# Patient Record
Sex: Male | Born: 1955 | Race: White | Hispanic: No | Marital: Married
Health system: Midwestern US, Academic
[De-identification: ages and names within clinical notes are randomized; demographics above are authoritative.]

---

## 2016-10-22 ENCOUNTER — Encounter: Admit: 2016-10-22 | Discharge: 2016-10-22 | Payer: Commercial Managed Care - PPO

## 2016-10-22 NOTE — Telephone Encounter
Received new referral, via fax. Docs scanned in 10/22/16.

## 2016-10-23 NOTE — Telephone Encounter
Service: LTC  Referring: Berline Chough, MD  Urgency: Urgent  Provider: First available  Dx: Multiple liver lesions, Thyroid cancer  OV Note: In outside records  Imaging/Location: PET scan report in outside records, Bone scan imaging from January 2018 in [facility]  Pathology/Location: None  Labs: In outside records, some from 2017 in [facility]

## 2016-10-24 ENCOUNTER — Encounter: Admit: 2016-10-24 | Discharge: 2016-10-24 | Payer: Commercial Managed Care - PPO

## 2016-10-24 NOTE — Telephone Encounter
Called patient to schedule new appointment with Dr. Suzette Battiest on Monday, October 29, 2016 at 1:00pm.   Verified demos and mailed appointment letter/map.

## 2016-10-29 ENCOUNTER — Encounter: Admit: 2016-10-29 | Discharge: 2016-10-29 | Payer: Commercial Managed Care - PPO

## 2016-10-29 ENCOUNTER — Ambulatory Visit: Admit: 2016-10-29 | Discharge: 2016-10-29 | Payer: Commercial Managed Care - PPO

## 2016-10-29 DIAGNOSIS — IMO0002 Squamous cell carcinoma: ICD-10-CM

## 2016-10-29 DIAGNOSIS — N200 Calculus of kidney: ICD-10-CM

## 2016-10-29 DIAGNOSIS — M549 Dorsalgia, unspecified: ICD-10-CM

## 2016-10-29 DIAGNOSIS — E785 Hyperlipidemia, unspecified: Secondary | ICD-10-CM

## 2016-10-29 DIAGNOSIS — M545 Low back pain: ICD-10-CM

## 2016-10-29 DIAGNOSIS — K769 Liver disease, unspecified: Principal | ICD-10-CM

## 2016-10-29 DIAGNOSIS — N400 Enlarged prostate without lower urinary tract symptoms: ICD-10-CM

## 2016-10-29 DIAGNOSIS — C73 Malignant neoplasm of thyroid gland: ICD-10-CM

## 2016-10-29 DIAGNOSIS — E039 Hypothyroidism, unspecified: ICD-10-CM

## 2016-10-29 DIAGNOSIS — Z85850 Personal history of malignant neoplasm of thyroid: ICD-10-CM

## 2016-10-29 DIAGNOSIS — E119 Type 2 diabetes mellitus without complications: Principal | ICD-10-CM

## 2016-10-29 DIAGNOSIS — K2270 Barrett's esophagus without dysplasia: ICD-10-CM

## 2016-10-29 DIAGNOSIS — B999 Unspecified infectious disease: ICD-10-CM

## 2016-10-29 DIAGNOSIS — I10 Essential (primary) hypertension: ICD-10-CM

## 2016-10-29 DIAGNOSIS — F419 Anxiety disorder, unspecified: ICD-10-CM

## 2016-10-29 DIAGNOSIS — K219 Gastro-esophageal reflux disease without esophagitis: ICD-10-CM

## 2016-10-29 DIAGNOSIS — M1990 Unspecified osteoarthritis, unspecified site: ICD-10-CM

## 2016-10-29 LAB — LIVER FUNCTION PANEL
Lab: 0.5 mg/dL (ref 0.3–1.2)
Lab: 22 U/L (ref 7–40)
Lab: 40 U/L (ref 7–56)
Lab: 8 g/dL (ref 6.0–8.0)
Lab: 82 U/L (ref 25–110)

## 2016-10-29 LAB — HEPATITIS C ANTIBODY W REFLEX HCV PCR QUANT: Lab: NEGATIVE g/dL (ref 3.5–5.0)

## 2016-10-30 NOTE — Progress Notes
Date of Service: 10/29/2016     Subjective:             Jimmy Waters is a 61 y.o. male presenting for evaluation of liver masses.    History of Present Illness   Jimmy Waters is a 61 y.o. 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  ???  Number and location of liver mass(es)- multiple  ???  Imaging to date-CT a/p without contrast 10/25/16; PET CT 10/05/16  ???  HCC risk- low, given stability (compared to CT 2015)  ???  Underlying liver disease- RF for chronic liver disease, including metabolic syndrome and prior significant EtOH use; however, no features to suggest underlying advanced fibrosis/cirrhosis    Patient is accompanied by his wife. Internal and outside records are reviewed. They are excellent historians.    Patient reports first hearing of concern for liver masses recently after PET scan. PET was ordered following + RAI scan. He had been told that he had hepatic cysts in the past, but now with concern for metastatic thyroid cancer, or other non benign mass. CT a/p with contrast back in 2015 (performed for abdominal pain) demonstrated multiple, low density hepatic lesions, consistent with cysts; hepatic steatosis; normal size liver and spleen. PET demonstrated six non metabolically active, low density liver lesions, largest measuring 1.9cm. He recently has suffered from nephrolithiasis and had non contrasted imaging performed as well, including CT without contrast 10/25/16, with notable hepatic and renal cysts. He has never been told that he has elevated liver tests. He has not had any signs or symptoms referable to liver disease. Specifically, he denies BRBPR, melena, jaundice, pruritus, fluid retention, day-night reversal and episodes of confusion. RF for HCV include a remote history intranasal methamphetamine use. No other RF for HCV, including blood transfusion before 1990, unprofessionally placed tattoos, IVDA, intranasal cocaine use, nor high risk sexual behavior. He has never undergone liver biopsy. He has never undergone endoscopy. Last surveillance colon last week without polyps; polyps noted on prior.    He reports significant alcohol use when he was working, typically finishing a 12 pack plus of beer with a few guys after work each night. He is now retired and drinks 1-2 drinks per week.    Family history is significant for alcoholism in his father and RA in sister.    Past Medical History:  Past Medical History:   Diagnosis Date   ??? Anxiety disorder    ??? Back pain    ??? Barrett esophagus    ??? BPH (benign prostatic hyperplasia)    ??? Cataract    ??? Chronic lower back pain    ??? Depression (disease)    ??? DM type 2 (diabetes mellitus, type 2) (HCC)    ??? Dyslipidemia    ??? GERD (gastroesophageal reflux disease)    ??? HLD (hyperlipidemia)    ??? HTN (hypertension)    ??? Hypothyroidism    ??? Infection    ??? Kidney stones    ??? Osteoarthritis    ??? Squamous cell carcinoma    ??? Thyroid cancer (HCC) 2009       Surgical History:  Past Surgical History:   Procedure Laterality Date   ??? BACK  SURGERY  1982   ??? CYSTOURETHROSCOPY  2012   ??? KIDNEY STONE SURGERY  2012   ??? COLONOSCOPY  02/14/11   ??? HX APPENDECTOMY  12/12   ??? HX JOINT REPLACEMENT     ??? HX VASECTOMY     ??? THYROIDECTOMY         Social History:  Social History     Social History   ??? Marital status: Married     Spouse name: N/A   ??? Number of children: N/A   ??? Years of education: N/A     Occupational History   ??? Not on file.     Social History Main Topics   ??? Smoking status: Never Smoker   ??? Smokeless tobacco: Never Used   ??? Alcohol use 3.0 oz/week     3 Cans of beer, 2 Shots of liquor per week   ??? Drug use: No ??? Sexual activity: Yes     Other Topics Concern   ??? Not on file     Social History Narrative   ??? No narrative on file       Family History:  Family History   Problem Relation Age of Onset   ??? Cancer-Lung Mother    ??? Melanoma Mother    ??? Cancer Mother    ??? Diabetes Mother    ??? Heart Disease Mother    ??? Diabetes Father    ??? Hypertension Sister    ??? Heart Disease Sister    ??? Cancer Sister    ??? Diabetes Sister    ??? Arthritis Sister    ??? Hypertension Brother    ??? Diabetes Brother    ??? Arthritis Brother             Review of Systems  A complete 10 point review of systems was asked and answered in the negative unless specifically commented upon in the HPI    Objective:         ??? acetaminophen (TYLENOL) 500 mg tablet Take 500 mg by mouth every 6 hours as needed for Pain. Max of 4,000 mg of acetaminophen in 24 hours.   ??? aspirin 81 mg chewable tablet Take 81 mg by mouth daily.     ??? atorvastatin (LIPITOR) 10 mg tablet Take 1 tablet by mouth daily. Take as directed   ??? blood sugar diagnostic test strip Use 1 strip as directed before meals and at bedtime.   ??? cholecalciferol (Vitamin D3) (VITAMIN D-3) 1,000 units tablet Take 1,000 Units by mouth twice daily.     ??? ciclopirox (LOPROX) 0.77 % topical cream Apply  topically to affected area twice daily.   ??? docusate (COLACE) 50 mg/5 mL oral solution Take 50 mg by mouth daily.   ??? dutasteride (AVODART) 0.5 mg capsule Take 1 Cap by mouth daily.   ??? Fluocinolone 0.01 % oil Apply  topically to affected area.   ??? gabapentin (NEURONTIN) 600 mg tablet Take 600 mg by mouth every 8 hours.   ??? glimepiride (AMARYL) 4 mg tablet Take 1 Tab by mouth twice daily.   ??? levothyroxine (SYNTHROID) 200 mcg tablet Take 1 Tab by mouth daily.   ??? LORazepam (ATIVAN) 1 mg tablet Take 1 mg by mouth twice daily.   ??? metFORMIN (GLUCOPHAGE) 1,000 mg tablet Take 1 Tab by mouth twice daily. (Patient taking differently: Take 1,000 mg by mouth daily.) ??? metoprolol tartrate (LOPRESSOR) 25 mg tablet Take 25 mg by mouth twice daily.   ??? omeprazole DR(+) (  PRILOSEC) 40 mg capsule Take 40 mg by mouth twice daily.   ??? ondansetron (ZOFRAN ODT) 4 mg rapid dissolve tablet Take 1 Tab by mouth every 8 hours as needed for Nausea. (Patient taking differently: Dissolve 8 mg by mouth every 8 hours as needed for Nausea.)   ??? tamsulosin (FLOMAX) 0.4 mg capsule Take 0.4 mg by mouth daily. Do not crush, chew or open capsules. Take 30 minutes following the same meal each day.   ??? traZODone (DESYREL) 100 mg tablet Take 100 mg by mouth at bedtime daily.   ??? venlafaxine XR (EFFEXOR XR) 75 mg capsule Take 75 mg by mouth daily. Take with food.   ??? vitamins, B complex tab Take 1 Tab by mouth daily.   ??? vitamins, multiple (ONE-A-DAY MENS) tablet Take 1 Tab by mouth daily.     ??? ZETIA 10 mg tablet TAKE ONE TABLET BY MOUTH ONCE DAILY     Vitals:    10/29/16 1243   BP: 138/79   Pulse: 94   Resp: 18   Temp: 36.7 ???C (98 ???F)   TempSrc: Oral   SpO2: 96%   Weight: 107.6 kg (237 lb 3.2 oz)   Height: 185.4 cm (73)     Body mass index is 31.29 kg/m???.     Physical Exam    Vitals reviewed.   Constitutional: Well-developed, well-nourished, in no apparent distress.    HEENT: Normocephalic. No scleral icterus. Moist oral mucosa. Dentition fair  Neck/Lymph: Normal ROM, supple. Absent thyroid.  No lymphadenopathy  Cardiac:  Regular rate and rhythm.  No overt murmurs  Respiratory: Clear to auscultation bilaterally.  No wheezes or rales  GI:  Abdomen soft, non-distended, non-tender. BS present. No shifting dullness. No overt hepatosplenomegaly.     Skin:  Skin is warm and dry. No rash noted.  No jaundice. No spider nevi noted.  No palmar erythema  Peripheral Vascular: No lower extremity edema. 2+ pulses in all extremities  Musculoskeletal:  ROM intact, normal muscle bulk    Psychiatric: Normal mood and affect. Behavior is normal.  Neuro:  No asterixis, no tremor       Labs and Diagnostic tests:  CBC w/Diff Lab Results   Component Value Date/Time    WBC 7.7 08/20/2015    RBC 4.62 08/20/2015    HGB 14.1 08/20/2015    HCT 43.4 08/20/2015    MCV 93.9 08/20/2015    MCH 30.5 08/20/2015    MCHC 32.5 08/20/2015    RDW 14.1 08/20/2015    PLTCT 170 08/20/2015    MPV 11.8 08/20/2015    Lab Results   Component Value Date/Time    NEUT 58.8 04/22/2013    ANC 4.6 04/22/2013    LYMA 28.5 04/22/2013    ALC 2.2 04/22/2013    MONA 6.4 04/22/2013    AMC 0.5 04/22/2013    EOSA 2 04/16/2013 12:49 PM    AEC 0.2 04/22/2013    BASA 3.2 04/22/2013    ABC 0.2 04/22/2013        Comprehensive Metabolic Profile    Lab Results   Component Value Date/Time    NA 141 11/07/2015    K 4.4 11/07/2015    CL 103 11/07/2015    CO2 30.0 11/07/2015    GAP 12 11/07/2015    BUN 21.0 11/07/2015    CR 0.89 11/07/2015    GLU 101 11/07/2015    Lab Results   Component Value Date/Time    CA 9.7 11/07/2015  PO4 3.1 06/28/2015    ALBUMIN 4.6 10/29/2016 01:50 PM    TOTPROT 8.0 10/29/2016 01:50 PM    ALKPHOS 82 10/29/2016 01:50 PM    AST 22 10/29/2016 01:50 PM    ALT 40 10/29/2016 01:50 PM    TOTBILI 0.5 10/29/2016 01:50 PM    GFR 92.3 11/07/2015    GFRAA 106.7 11/07/2015          Computed MELD-Na score unavailable. Necessary lab results were not found in the last year.  Computed MELD score unavailable. Necessary lab results were not found in the last year.    Imaging:  As above      Procedures:  Liver biopsy: none    EGD: none    Colonoscopy: as above     Assessment and Plan:    Liver masses: stable since at least 2015 and by imaging, consistent with simple hepatic cysts (also with renal cysts). He has not had good contrasted, liver protocol imaging; however, I doubt these lesions represent metastatic thyroid cancer, other metastatic cancer, or primary liver cancer. I discussed both benign and malignancy liver masses with the patient. He does have RF for chronic liver disease; however, does not have any historical, exam, laboratory, nor imaging findings to suggest advanced fibrosis/cirrhosis.  - Triple phase CT  - Liver tests  - HCV screening    Follow Up:  PRN pending above    Labs today: liver tests, HCV screen    Imaging/biopsy: triple phase CT    Thank you very much for the opportunity to participate in the care of this patient.  If you have any further questions, please don't hesitate to contact our office.    _____________________________  Cherlynn June, MD  Assistant Professor of Medicine  Advanced Hepatology & Liver Transplantation  [facility]

## 2016-11-01 NOTE — Progress Notes
Patient notified of results.

## 2016-11-15 ENCOUNTER — Ambulatory Visit: Admit: 2016-11-15 | Discharge: 2016-11-15 | Payer: Commercial Managed Care - PPO

## 2016-11-15 DIAGNOSIS — K769 Liver disease, unspecified: Principal | ICD-10-CM

## 2016-11-15 DIAGNOSIS — Z85850 Personal history of malignant neoplasm of thyroid: ICD-10-CM

## 2016-11-15 LAB — POC CREATININE, RAD: Lab: 0.7 mg/dL (ref 0.4–1.24)

## 2016-11-15 MED ORDER — IOPAMIDOL 76 % IV SOLN
100 mL | Freq: Once | INTRAVENOUS | 0 refills | Status: CP
Start: 2016-11-15 — End: ?
  Administered 2016-11-15: 22:00:00 100 mL via INTRAVENOUS

## 2016-11-15 MED ORDER — SODIUM CHLORIDE 0.9 % IJ SOLN
50 mL | Freq: Once | INTRAVENOUS | 0 refills | Status: CP
Start: 2016-11-15 — End: ?
  Administered 2016-11-15: 22:00:00 50 mL via INTRAVENOUS

## 2016-11-19 ENCOUNTER — Encounter: Admit: 2016-11-19 | Discharge: 2016-11-19 | Payer: Commercial Managed Care - PPO

## 2016-11-19 NOTE — Progress Notes
Results reviewed in [facility] 11/19/16; confirms liver cyst, f/u in clinic in 1 year.

## 2016-11-19 NOTE — Progress Notes
Patient notified of results and recommendations.

## 2017-05-23 ENCOUNTER — Encounter: Admit: 2017-05-23 | Discharge: 2017-05-23 | Payer: Commercial Managed Care - PPO

## 2017-11-11 ENCOUNTER — Encounter: Admit: 2017-11-11 | Discharge: 2017-11-11 | Payer: Commercial Managed Care - PPO

## 2017-11-13 ENCOUNTER — Encounter: Admit: 2017-11-13 | Discharge: 2017-11-13 | Payer: Commercial Managed Care - PPO

## 2018-01-28 ENCOUNTER — Encounter: Admit: 2018-01-28 | Discharge: 2018-01-28 | Payer: Commercial Managed Care - HMO

## 2018-01-28 ENCOUNTER — Encounter: Admit: 2018-01-28 | Discharge: 2018-01-29 | Payer: Commercial Managed Care - HMO

## 2018-10-08 ENCOUNTER — Encounter: Admit: 2018-10-08 | Discharge: 2018-10-08

## 2018-10-08 DIAGNOSIS — M545 Low back pain: Secondary | ICD-10-CM

## 2018-10-13 ENCOUNTER — Ambulatory Visit: Admit: 2018-10-13 | Discharge: 2018-10-13

## 2018-10-13 ENCOUNTER — Encounter: Admit: 2018-10-13 | Discharge: 2018-10-13

## 2018-10-13 DIAGNOSIS — M545 Low back pain: Secondary | ICD-10-CM

## 2018-10-13 DIAGNOSIS — M549 Dorsalgia, unspecified: Secondary | ICD-10-CM

## 2018-10-14 ENCOUNTER — Encounter: Admit: 2018-10-14 | Discharge: 2018-10-14

## 2018-10-14 DIAGNOSIS — E119 Type 2 diabetes mellitus without complications: Secondary | ICD-10-CM

## 2018-10-14 DIAGNOSIS — N200 Calculus of kidney: Secondary | ICD-10-CM

## 2018-10-14 DIAGNOSIS — E039 Hypothyroidism, unspecified: Secondary | ICD-10-CM

## 2018-10-14 DIAGNOSIS — E785 Hyperlipidemia, unspecified: Secondary | ICD-10-CM

## 2018-10-14 DIAGNOSIS — IMO0002 Squamous cell carcinoma: Secondary | ICD-10-CM

## 2018-10-14 DIAGNOSIS — M545 Low back pain: Secondary | ICD-10-CM

## 2018-10-14 DIAGNOSIS — C73 Malignant neoplasm of thyroid gland: Secondary | ICD-10-CM

## 2018-10-14 DIAGNOSIS — K2270 Barrett's esophagus without dysplasia: Secondary | ICD-10-CM

## 2018-10-14 DIAGNOSIS — K5732 Diverticulitis of large intestine without perforation or abscess without bleeding: Secondary | ICD-10-CM

## 2018-10-14 DIAGNOSIS — N400 Enlarged prostate without lower urinary tract symptoms: Secondary | ICD-10-CM

## 2018-10-14 DIAGNOSIS — M549 Dorsalgia, unspecified: Secondary | ICD-10-CM

## 2018-10-14 DIAGNOSIS — M1990 Unspecified osteoarthritis, unspecified site: Secondary | ICD-10-CM

## 2018-10-14 DIAGNOSIS — F419 Anxiety disorder, unspecified: Secondary | ICD-10-CM

## 2018-10-14 DIAGNOSIS — I10 Essential (primary) hypertension: Secondary | ICD-10-CM

## 2018-10-14 DIAGNOSIS — K219 Gastro-esophageal reflux disease without esophagitis: Secondary | ICD-10-CM

## 2018-10-14 DIAGNOSIS — B999 Unspecified infectious disease: Secondary | ICD-10-CM

## 2018-10-21 ENCOUNTER — Encounter: Admit: 2018-10-21 | Discharge: 2018-10-21

## 2018-10-22 ENCOUNTER — Encounter: Admit: 2018-10-22 | Discharge: 2018-10-22

## 2018-10-22 DIAGNOSIS — C73 Malignant neoplasm of thyroid gland: Secondary | ICD-10-CM

## 2018-10-22 DIAGNOSIS — M5116 Intervertebral disc disorders with radiculopathy, lumbar region: Principal | ICD-10-CM

## 2018-10-22 DIAGNOSIS — B999 Unspecified infectious disease: Secondary | ICD-10-CM

## 2018-10-22 DIAGNOSIS — IMO0002 Squamous cell carcinoma: Secondary | ICD-10-CM

## 2018-10-22 DIAGNOSIS — N200 Calculus of kidney: Secondary | ICD-10-CM

## 2018-10-22 DIAGNOSIS — M1990 Unspecified osteoarthritis, unspecified site: Secondary | ICD-10-CM

## 2018-10-22 DIAGNOSIS — K5732 Diverticulitis of large intestine without perforation or abscess without bleeding: Secondary | ICD-10-CM

## 2018-10-22 DIAGNOSIS — F419 Anxiety disorder, unspecified: Secondary | ICD-10-CM

## 2018-10-22 DIAGNOSIS — E119 Type 2 diabetes mellitus without complications: Secondary | ICD-10-CM

## 2018-10-22 DIAGNOSIS — M549 Dorsalgia, unspecified: Secondary | ICD-10-CM

## 2018-10-22 DIAGNOSIS — K2270 Barrett's esophagus without dysplasia: Secondary | ICD-10-CM

## 2018-10-22 DIAGNOSIS — N400 Enlarged prostate without lower urinary tract symptoms: Secondary | ICD-10-CM

## 2018-10-22 DIAGNOSIS — E039 Hypothyroidism, unspecified: Secondary | ICD-10-CM

## 2018-10-22 DIAGNOSIS — I10 Essential (primary) hypertension: Secondary | ICD-10-CM

## 2018-10-22 DIAGNOSIS — K219 Gastro-esophageal reflux disease without esophagitis: Secondary | ICD-10-CM

## 2018-10-22 DIAGNOSIS — M545 Low back pain: Secondary | ICD-10-CM

## 2018-10-22 DIAGNOSIS — E785 Hyperlipidemia, unspecified: Secondary | ICD-10-CM

## 2018-10-22 MED ORDER — IOHEXOL 240 MG IODINE/ML IV SOLN
2.5 mL | Freq: Once | EPIDURAL | 0 refills | Status: CP
Start: 2018-10-22 — End: ?
  Administered 2018-10-22: 21:00:00 2.5 mL via EPIDURAL

## 2018-10-22 MED ORDER — BUPIVACAINE (PF) 0.25 % (2.5 MG/ML) IJ SOLN
2 mL | Freq: Once | INTRAMUSCULAR | 0 refills | Status: CP
Start: 2018-10-22 — End: ?
  Administered 2018-10-22: 21:00:00 2 mL via INTRAMUSCULAR

## 2018-10-22 MED ORDER — TRIAMCINOLONE ACETONIDE 40 MG/ML IJ SUSP
80 mg | Freq: Once | EPIDURAL | 0 refills | Status: DC
Start: 2018-10-22 — End: 2018-10-22

## 2018-10-22 NOTE — Procedures
Attending Surgeon: Gabriel Rung, MD    Anesthesia: Local    Pre-Procedure Diagnosis:   1. Lumbar disc disease with radiculopathy    2. Lumbar pain    3. Lumbar radicular pain        Post-Procedure Diagnosis:   1. Lumbar disc disease with radiculopathy    2. Lumbar pain    3. Lumbar radicular pain        SNR/TF LMBR/SAC  Procedure: transforaminal epidural    Laterality: left    Location: Lumbar/Sacral - S1-2      Consent:   Consent obtained: written  Consent given by: patient  Risks discussed: allergic reaction, bleeding, infection, weakness and no change or worsening in pain  Alternatives discussed: alternative treatment     Universal Protocol:  Relevant documents: relevant documents present and verified  Test results: test results available and properly labeled  Imaging studies: imaging studies available  Required items: required blood products, implants, devices, and special equipment available  Site marked: the operative site was marked  Patient identity confirmed: Patient identify confirmed verbally with patient.        Time out: Immediately prior to procedure a time out was called to verify the correct patient, procedure, equipment, support staff and site/side marked as required      Procedures Details:   Indications: pain   Prep: chlorhexidine  Patient position: prone  Estimated Blood Loss: minimal  Specimens: none  Number of Levels: 1  Approach: left paramedian  Guidance: fluoroscopy  Needle size: 25 G  Injection procedure: Negative aspiration for blood  Patient tolerance: Patient tolerated the procedure well with no immediate complications. Pressure was applied, and hemostasis was accomplished.  Outcome: Pain improved  Comments: Bupivacaine .25% 1 ml was injected. Paresthesia to the plantar aspect of the left foot occurred on needle placement        Estimated blood loss: none or minimal  Specimens: none  Patient tolerated the procedure well with no immediate complications. Pressure was applied, and hemostasis was accomplished.

## 2018-10-22 NOTE — Progress Notes
[facility]  INTERVENTIONAL PAIN PROCEDURE HISTORY AND PHYSICAL    Chief Complaint   Patient presents with   ??? Lower Back - Pain       HISTORY OF PRESENT ILLNESS:  Jimmy Waters presents on referral from Dr. Jean Rosenthal for care regarding back and left leg pain. His pain is long-standing since the early 80s. He is status post 2 lumbar decompression surgeries. Epidural injections have reduced pain for modest duration.    Medical History:   Diagnosis Date   ??? Anxiety disorder    ??? Back pain    ??? Barrett esophagus    ??? Barrett esophagus    ??? BPH (benign prostatic hyperplasia)    ??? Cataract    ??? Chronic lower back pain    ??? Depression (disease)    ??? Diverticulitis of colon (without mention of hemorrhage)(562.11)     type 2   ??? DM type 2 (diabetes mellitus, type 2) (HCC)    ??? Dyslipidemia    ??? GERD (gastroesophageal reflux disease)    ??? HLD (hyperlipidemia)    ??? HTN (hypertension)    ??? Hypothyroidism    ??? Infection    ??? Kidney stones    ??? Osteoarthritis    ??? Squamous cell carcinoma    ??? Thyroid cancer (HCC) 2009   ??? Thyroid cancer [facility])        Surgical History:   Procedure Laterality Date   ??? BACK SURGERY  1982   ??? CYSTOURETHROSCOPY  2012   ??? KIDNEY STONE SURGERY  2012   ??? COLONOSCOPY  02/14/11   ??? HX APPENDECTOMY  12/12   ??? HX JOINT REPLACEMENT     ??? HX VASECTOMY     ??? THYROIDECTOMY         family history includes Arthritis in his brother and sister; Cancer in his mother and sister; Cancer-Lung in his mother; Diabetes in his brother, father, mother, and sister; Heart Disease in his mother and sister; Hypertension in his brother and sister; Melanoma in his mother.    Social History     Socioeconomic History   ??? Marital status: Married     Spouse name: Not on file   ??? Number of children: Not on file   ??? Years of education: Not on file   ??? Highest education level: Not on file   Occupational History   ??? Not on file   Tobacco Use   ??? Smoking status: Never Smoker   ??? Smokeless tobacco: Never Used Substance and Sexual Activity   ??? Alcohol use: Yes     Alcohol/week: 5.0 standard drinks     Types: 3 Cans of beer, 2 Shots of liquor per week   ??? Drug use: No   ??? Sexual activity: Yes   Other Topics Concern   ??? Not on file   Social History Narrative   ??? Not on file       Allergies   Allergen Reactions   ??? Ambien [Zolpidem] HALLUCINATIONS   ??? Statins-Hmg-Coa Reductase Inhibitors MUSCLE PAIN       Vitals:    10/22/18 1525   BP: (!) 152/91   Pulse: 98   Temp: 36.8 ???C (98.3 ???F)   TempSrc: Oral   SpO2: 97%   Weight: 113.4 kg (250 lb)   Height: 185.4 cm (73)   PainSc: Five       REVIEW OF SYSTEMS: 10 point ROS obtained and negative except for back and left leg pain  PHYSICAL EXAM:   General: Alert and oriented, very pleasant male.   HEENT showed extraocular muscles were intact and no other abnormalities.  Unlabored breathing.  Regular rate and rhythm on CV exam.   5/5 strength in bilateral upper and lower extremities.    Sensation is intact to light touch and equal in the upper and lower extremities.    Left lumbar tenderness to palpation  Straight leg raise is negative    IMPRESSION:    1. Lumbar disc disease with radiculopathy    2. Lumbar pain    3. Lumbar radicular pain         PLAN: Selective nerve root block at left S1 with marcaine only. Follow up with Dr. Edison Pace office tomorrow to report results of pain diary.

## 2018-10-22 NOTE — Progress Notes

## 2018-10-23 ENCOUNTER — Ambulatory Visit: Admit: 2018-10-22 | Discharge: 2018-10-23

## 2018-10-23 DIAGNOSIS — E890 Postprocedural hypothyroidism: Secondary | ICD-10-CM

## 2018-10-23 DIAGNOSIS — M5416 Radiculopathy, lumbar region: Secondary | ICD-10-CM

## 2018-10-23 DIAGNOSIS — E785 Hyperlipidemia, unspecified: Secondary | ICD-10-CM

## 2018-10-23 DIAGNOSIS — Z85850 Personal history of malignant neoplasm of thyroid: Secondary | ICD-10-CM

## 2018-10-23 DIAGNOSIS — I10 Essential (primary) hypertension: Secondary | ICD-10-CM

## 2018-10-23 DIAGNOSIS — M545 Low back pain: Secondary | ICD-10-CM

## 2018-10-23 DIAGNOSIS — E1136 Type 2 diabetes mellitus with diabetic cataract: Secondary | ICD-10-CM

## 2018-10-23 DIAGNOSIS — M1990 Unspecified osteoarthritis, unspecified site: Secondary | ICD-10-CM

## 2018-10-27 ENCOUNTER — Encounter: Admit: 2018-10-27 | Discharge: 2018-10-27

## 2018-11-06 ENCOUNTER — Encounter: Admit: 2018-11-06 | Discharge: 2018-11-06

## 2018-11-06 DIAGNOSIS — M549 Dorsalgia, unspecified: Secondary | ICD-10-CM

## 2018-11-06 DIAGNOSIS — M79605 Pain in left leg: Secondary | ICD-10-CM

## 2018-11-06 DIAGNOSIS — I10 Essential (primary) hypertension: Secondary | ICD-10-CM

## 2018-11-06 DIAGNOSIS — M1990 Unspecified osteoarthritis, unspecified site: Secondary | ICD-10-CM

## 2018-11-06 DIAGNOSIS — C73 Malignant neoplasm of thyroid gland: Secondary | ICD-10-CM

## 2018-11-06 DIAGNOSIS — F419 Anxiety disorder, unspecified: Secondary | ICD-10-CM

## 2018-11-06 DIAGNOSIS — E039 Hypothyroidism, unspecified: Secondary | ICD-10-CM

## 2018-11-06 DIAGNOSIS — K219 Gastro-esophageal reflux disease without esophagitis: Secondary | ICD-10-CM

## 2018-11-06 DIAGNOSIS — N200 Calculus of kidney: Secondary | ICD-10-CM

## 2018-11-06 DIAGNOSIS — E785 Hyperlipidemia, unspecified: Secondary | ICD-10-CM

## 2018-11-06 DIAGNOSIS — M545 Low back pain: Secondary | ICD-10-CM

## 2018-11-06 DIAGNOSIS — K5732 Diverticulitis of large intestine without perforation or abscess without bleeding: Secondary | ICD-10-CM

## 2018-11-06 DIAGNOSIS — K2270 Barrett's esophagus without dysplasia: Secondary | ICD-10-CM

## 2018-11-06 DIAGNOSIS — N400 Enlarged prostate without lower urinary tract symptoms: Secondary | ICD-10-CM

## 2018-11-06 DIAGNOSIS — B999 Unspecified infectious disease: Secondary | ICD-10-CM

## 2018-11-06 DIAGNOSIS — IMO0002 Squamous cell carcinoma: Secondary | ICD-10-CM

## 2018-11-06 DIAGNOSIS — E119 Type 2 diabetes mellitus without complications: Secondary | ICD-10-CM

## 2018-11-06 NOTE — Progress Notes
[facility] CLINIC NOTE       HISTORY: He returns today in follow up to discuss his surgical options.  He states that his diagnostic injection did offer temporary benefit and then by night his leg pain returned.  In the last year he has had four epidural injections that were not helpful.  He is also having some right hip and knee pain.  He had his hip and knee evaluated by Dr. Renae Fickle and was advised that his right-sided pain was coming from his back.  His primary complaint at this time is left buttock and leg pain. He does have some physical therapy ordered for his hip and knee.      PHYSICAL EXAMINATION:  Height 72 .  Weight 251 pounds.  BMI of 32.99.  Examination of his back shows a healed incision, no evidence of infection, no pain to palpation.  Pretty limited range of motion of his back secondary to back pain.  Motor examination of the lower extremities is full to one time testing including EHL, anterior tib, quads, and hip flexors.  Sensation intact distally.  Palpable pedal pulses.  Reflexes normal and reactive knees and ankles bilaterally.  No pain with internal/external rotation of the hips.  Negative straight leg raise    DIAGNOSTIC STUDIES: MRI of the lumbar spine without gadolinium was again reviewed that shows prior laminotomy changes on the left at L5-S1 with foraminal narrowing left L5-S1.   CT scan of the lumbar spine was also reviewed that shows a non-instrumented fusion at L4-5 without signs of big fusion mass.    IMPRESSION: Left foraminal narrowing at L5-S1.    PLAN:  I have reviewed his MRI results with him. I reassured him that I did not see anything on the right side to explain his hip and knee pain.  One option would be to update an MRI of the lumbar spine with and without contrast to better evaluate the L5-S1 level.  He could consider repeating the injection with steroid and see if that would give him some lasting benefit.  If he were to consider surgery my recommendation would be a repeat left laminotomy at L5-S1.  I have discussed the risks and potential complications of surgery, including, but not limited to, anesthesia, bleeding, infection, neurovascular injury, spinal fluid leak, instability and persistent pain. We have also discussed the convalescence and recovery.  Since he did have short-term relief with the diagnostic injection, a repeat laminotomy would be reasonable to consider. He could also incorporate physical therapy with his knee and hip and was provided a prescription for lumbar stabilization program.   Surgery is going to be most predictable for buttock and leg pain and less predictable for back pain.  He would like to look at the surgery schedule and tentatively schedule surgery.  I have tried to answer all his questions at length.                Review of Systems   Constitutional: Positive for fatigue.   HENT: Negative.    Eyes: Negative.    Respiratory: Positive for apnea.    Cardiovascular: Negative.    Gastrointestinal: Negative.    Endocrine: Positive for polydipsia, polyphagia and polyuria.   Genitourinary: Positive for frequency.   Musculoskeletal: Positive for arthralgias and back pain.   Skin: Positive for rash.   Allergic/Immunologic: Negative.    Neurological: Positive for weakness and numbness.   Psychiatric/Behavioral: The patient is nervous/anxious.        Current  Outpatient Medications:   ???  acetaminophen (TYLENOL) 500 mg tablet, Take 500 mg by mouth every 6 hours as needed for Pain. Max of 4,000 mg of acetaminophen in 24 hours., Disp: , Rfl:   ???  ascorbic acid (VITAMIN C PO), Take  by mouth., Disp: , Rfl:   ???  aspirin 81 mg chewable tablet, Take 81 mg by mouth daily.  , Disp: , Rfl:   ???  atorvastatin (LIPITOR) 10 mg tablet, Take 1 tablet by mouth daily. Take as directed, Disp: 90 tablet, Rfl: 3  ???  blood sugar diagnostic test strip, Use 1 strip as directed before meals and at bedtime., Disp: , Rfl: ???  buPROPion XL (WELLBUTRIN XL) 300 mg tablet, Take 300 mg by mouth every morning. Do not crush or chew., Disp: , Rfl:   ???  cholecalciferol (Vitamin D3) (VITAMIN D-3) 1,000 units tablet, Take 1,000 Units by mouth twice daily.  , Disp: , Rfl:   ???  cyanocobalamin (VITAMIN B-12) 500 mcg tablet, Take 500 mcg by mouth daily., Disp: , Rfl:   ???  diphenoxylate/atropine (LOMOTIL) 2.5/0.025 mg tablet, Take 1 tablet by mouth four times daily as needed for Diarrhea., Disp: , Rfl:   ???  dutasteride (AVODART) 0.5 mg capsule, Take 1 Cap by mouth daily., Disp: 90 Cap, Rfl: 3  ???  empagliflozin (JARDIANCE) 10 mg tablet, Take 10 mg by mouth daily., Disp: , Rfl:   ???  Fluocinolone 0.01 % oil, Apply  topically to affected area., Disp: , Rfl:   ???  gabapentin (NEURONTIN) 600 mg tablet, Take 600 mg by mouth every 8 hours., Disp: , Rfl:   ???  hydrocortisone 1 % topical cream, Apply  topically to affected area twice daily., Disp: , Rfl:   ???  IBUPROFEN PO, Take  by mouth., Disp: , Rfl:   ???  levothyroxine (SYNTHROID) 200 mcg tablet, Take 1 Tab by mouth daily., Disp: 90 Tab, Rfl: 3  ???  metFORMIN (GLUCOPHAGE) 1,000 mg tablet, Take 1 Tab by mouth twice daily. (Patient taking differently: Take 1,000 mg by mouth daily.), Disp: 180 Tab, Rfl: 3  ???  metoprolol tartrate (LOPRESSOR) 25 mg tablet, Take 25 mg by mouth twice daily., Disp: , Rfl:   ???  omeprazole DR(+) (PRILOSEC) 40 mg capsule, Take 40 mg by mouth twice daily., Disp: , Rfl:   ???  ondansetron (ZOFRAN ODT) 4 mg rapid dissolve tablet, Take 1 Tab by mouth every 8 hours as needed for Nausea. (Patient taking differently: Dissolve 8 mg by mouth every 8 hours as needed for Nausea.), Disp: 90 Tab, Rfl: 3  ???  tamsulosin (FLOMAX) 0.4 mg capsule, Take 0.4 mg by mouth daily. Do not crush, chew or open capsules. Take 30 minutes following the same meal each day., Disp: , Rfl:   ???  vitamins, multiple (ONE-A-DAY MENS) tablet, Take 1 Tab by mouth daily.  , Disp: , Rfl: ???  ZETIA 10 mg tablet, TAKE ONE TABLET BY MOUTH ONCE DAILY, Disp: 90 Tab, Rfl: 1     Allergies   Allergen Reactions   ??? Ambien [Zolpidem] HALLUCINATIONS   ??? Statins-Hmg-Coa Reductase Inhibitors MUSCLE PAIN     Physical Exam  Vitals:    11/06/18 1540 11/06/18 1547   BP: (!) 166/97 (!) 154/89   Pulse: 107 107   Temp: 36.9 ???C (98.4 ???F)    TempSrc: Oral    SpO2: 96%    Weight: 113.4 kg (250 lb)    Height: 185.4 cm (72.99)  PainSc: Six      Oswestry Total Score:: 38  Pain Score: Six  Body mass index is 32.99 kg/m???.              In the presence of Dr. Jean Rosenthal, I am taking down these notes, D. Lynch, scribe. 11/07/18 @ 3:55 pm

## 2018-11-07 ENCOUNTER — Ambulatory Visit: Admit: 2018-11-06 | Discharge: 2018-11-07

## 2018-11-07 ENCOUNTER — Encounter: Admit: 2018-11-07 | Discharge: 2018-11-07

## 2018-11-07 DIAGNOSIS — M545 Low back pain: Secondary | ICD-10-CM

## 2018-11-07 DIAGNOSIS — Z1159 Encounter for screening for other viral diseases: Secondary | ICD-10-CM

## 2018-11-07 DIAGNOSIS — M7918 Myalgia, other site: Secondary | ICD-10-CM

## 2018-11-07 DIAGNOSIS — M48062 Spinal stenosis, lumbar region with neurogenic claudication: Secondary | ICD-10-CM

## 2018-11-17 ENCOUNTER — Encounter: Admit: 2018-11-17 | Discharge: 2018-11-17

## 2018-11-17 DIAGNOSIS — M1990 Unspecified osteoarthritis, unspecified site: Secondary | ICD-10-CM

## 2018-11-17 DIAGNOSIS — IMO0002 Squamous cell carcinoma: Secondary | ICD-10-CM

## 2018-11-17 DIAGNOSIS — N400 Enlarged prostate without lower urinary tract symptoms: Secondary | ICD-10-CM

## 2018-11-17 DIAGNOSIS — E119 Type 2 diabetes mellitus without complications: Secondary | ICD-10-CM

## 2018-11-17 DIAGNOSIS — K5732 Diverticulitis of large intestine without perforation or abscess without bleeding: Secondary | ICD-10-CM

## 2018-11-17 DIAGNOSIS — K219 Gastro-esophageal reflux disease without esophagitis: Secondary | ICD-10-CM

## 2018-11-17 DIAGNOSIS — C73 Malignant neoplasm of thyroid gland: Secondary | ICD-10-CM

## 2018-11-17 DIAGNOSIS — E039 Hypothyroidism, unspecified: Secondary | ICD-10-CM

## 2018-11-17 DIAGNOSIS — B999 Unspecified infectious disease: Secondary | ICD-10-CM

## 2018-11-17 DIAGNOSIS — F419 Anxiety disorder, unspecified: Secondary | ICD-10-CM

## 2018-11-17 DIAGNOSIS — N200 Calculus of kidney: Secondary | ICD-10-CM

## 2018-11-17 DIAGNOSIS — E785 Hyperlipidemia, unspecified: Secondary | ICD-10-CM

## 2018-11-17 DIAGNOSIS — M545 Low back pain: Secondary | ICD-10-CM

## 2018-11-17 DIAGNOSIS — M549 Dorsalgia, unspecified: Secondary | ICD-10-CM

## 2018-11-17 DIAGNOSIS — N62 Hypertrophy of breast: Secondary | ICD-10-CM

## 2018-11-17 DIAGNOSIS — C4490 Unspecified malignant neoplasm of skin, unspecified: Secondary | ICD-10-CM

## 2018-11-17 DIAGNOSIS — I10 Essential (primary) hypertension: Secondary | ICD-10-CM

## 2018-11-17 DIAGNOSIS — K3184 Gastroparesis: Secondary | ICD-10-CM

## 2018-11-17 DIAGNOSIS — M48062 Spinal stenosis, lumbar region with neurogenic claudication: Principal | ICD-10-CM

## 2018-11-17 DIAGNOSIS — Z01810 Encounter for preprocedural cardiovascular examination: Secondary | ICD-10-CM

## 2018-11-17 DIAGNOSIS — Z9889 Other specified postprocedural states: Secondary | ICD-10-CM

## 2018-11-17 DIAGNOSIS — K2270 Barrett's esophagus without dysplasia: Secondary | ICD-10-CM

## 2018-11-17 LAB — COMPREHENSIVE METABOLIC PANEL
Lab: 0.3 mg/dL (ref 0.3–1.2)
Lab: 0.7 mg/dL (ref 0.4–1.24)
Lab: 103 MMOL/L (ref 98–110)
Lab: 141 MMOL/L (ref 137–147)
Lab: 16 mg/dL (ref 7–25)
Lab: 23 MMOL/L (ref 21–30)
Lab: 23 U/L (ref 7–40)
Lab: 3.6 MMOL/L (ref 3.5–5.1)
Lab: 33 U/L (ref 7–56)
Lab: 4.4 g/dL (ref 3.5–5.0)
Lab: 69 U/L (ref 25–110)
Lab: 7.4 g/dL — ABNORMAL LOW (ref 6.0–8.0)
Lab: 80 mg/dL (ref 70–100)
Lab: 9.3 mg/dL (ref 8.5–10.6)
Lab: >60 mL/min (ref >60)
Lab: >60 mL/min (ref >60)
Lab: 15 — ABNORMAL HIGH (ref 3–12)

## 2018-11-17 LAB — CBC: Lab: 10 10*3/uL (ref 4.5–11.0)

## 2018-11-17 NOTE — Patient Instructions
SURGERY  SEPT. 9, 2020  - CHECK IN WITH ADMISSIONS AT 5:30 AM THE MORNING OF SURGERY. NOTHING TO EAT OR DRINK AFTER MIDNIGHT THE NIGHT BEFORE YOUR SURGERY.    USE CHLORHEXIDINE SPONGES TO TAKE 2 SHOWERS THE NIGHT BEFORE SURGERY. THESE CAN BE DONE BEFORE BED AND MORNING BEFORE SURGERY OR EARLY EVENING AND RIGHT BEFORE BED IF CHECK IN IS EARLY.    YOU WILL LEAVE THE HOSPITAL WITH A PRESCRIPTION FOR PAIN MEDICATION. THIS USUALLY WILL LAST YOU UNTIL YOUR FIRST POST OP APPOINTMENT.     WHEN YOU ARE IN NEED OF A REFILL, PLEASE CALL OUR OFFICE. REMEMBER TO GIVE OUR OFFICE 2-3 DAYS ADVANCED NOTICE THAT YOU REQUIRE A REFILL. IF A PRESCRIPTION NEEDS TO BE MAILED, PLEASE GIVE THIS OFFICE 7 DAYS ADVANCED NOTICE.    SAME DAY PRESCRIPTIONS CANNOT BE OBTAINED.    AFTER SURGERY, IF YOU HAVE ANY QUESTIONS, PLEASE CALL [phone].    IF AFTER HOURS OR WEEKENDS, CALL [phone] AND ASK FOR THE ORTHOPEDIC SPINE SURGERY RESIDENT ON CALL.

## 2018-11-17 NOTE — Progress Notes
SPINE CENTER HISTORY AND PHYSICAL    Chief Complaint   Patient presents with   ??? Lower Back - Pain          HISTORY:  Jimmy Waters is a 63 year old male seen in the office today for his history and physical.  He is scheduled for surgery on December 03, 2018.  He has a long standing history of problems with his low back dating back to at least 58.  He has a history of two prior back surgeries.  His initial surgery was in [place] in which he underwent a non-instrumented fusion at L4-5.  He reports he did well from that surgery until about 2006 when he had recurrent symptoms down into the left buttocks and down the back of the leg.  He underwent a second procedure with Dr. Doristine Counter and reports that that surgery never really helped him.  He has continued to have problems since that period of time.  Gradually over the years it has worsened.  He presents today stating that overall his chief complaint today is low back pain that seems to be slightly off to the left side.  He also has some recurrent symptoms into the left buttocks.  He really does not describe pain down the back of the leg currently.  There have been times in the past especially when he was working that he had pain all the way down the back of his leg but currently it seems to be just in the buttocks region.  He really does not describe any symptoms on the right side at all.  In general, he is worse when he stands and walks and he certainly is better when he sits.  Lifting severely aggravates things, bending and twisting really seems to aggravate his back and overall back pain is his biggest issue.  He completed formalized therapy in December 2019 which seemed to aggravate things.  He has also had two epidural steroid injections.  The first injection did not help him at all, the second one helped him quite a bit for about a month about 80% in both the back and the left leg.  He sees Dr. Denton Lank for that.  He has also talked with Dr. Denton Lank about potentially trying radio frequency ablation.  He did have a diagnostic injection that offered temporary benefit and then by night his leg pain returned.    PHYSICAL EXAMINATION:  Height 72 .  Weight 251 pounds. ???BMI of 32.99. ???Examination of his back shows a healed incision, no evidence of infection, no pain to palpation. ???Pretty limited range of motion of his back secondary to back pain. ???Motor examination of the lower extremities is full to one time testing including EHL, anterior tib, quads, and hip flexors. ???Sensation intact distally. ???Palpable pedal pulses. ???Reflexes normal and reactive knees and ankles bilaterally. ???No pain with internal/external rotation of the hips. ???Negative straight leg raise.    X-RAYS:  X-rays of the lumbar spine show a bit of a degenerative curve in the lumbar spine.  He is also leaning slightly towards the left.  Lateral view shows diffuse degenerative changes probably greatest at L4-5 and L5-S1.  Although he reports he had a non-instrumented fusion it is not obvious to me that that is what was done at L4-5.  I do not really see a big fusion mass and all the spinous processes are in place.  It certainly looks like he has had a left L5-S1 foraminotomy  ???  DIAGNOSTIC STUDIES:  MRI of the lumbar spine without gadolinium was again reviewed that shows prior laminotomy changes on the left at L5-S1 with foraminal narrowing left L5-S1.   CT scan of the lumbar spine was also reviewed that shows a non-instrumented fusion at L4-5 without signs of big fusion mass.  EMG nerve conduction test available for review dated 07-28-18 by Dr. Denton Lank that shows polyneuropathy but also a possible superimposed S1 radiculopathy cannot be 100% ruled out.  ???  IMPRESSION: Left foraminal narrowing at L5-S1.    PLAN:   I had a lengthy discussion with the patient about the problem and treatment alternatives.  I have again reviewed his diagnostic studies with him, as well as the pathophysiology and pathoanatomy of the problem.  He has pursued appropriate conservative management including time, activity modification, anti-inflammatory measures, physical therapy, core strengthening and an aerobic exercise program with the epidural steroid injections being the strongest option short of surgery.  The most aggressive option is surgery and surgery would entail a left L5-S1 laminotomy.  I have discussed the risks and potential complications of surgery, including, but not limited to, anesthesia, bleeding, infection, neurovascular injury, spinal fluid leak, instability and persistent pain. We have also discussed the convalescence and recovery.  He elects to proceed with surgery and his informed consent was obtained today.  He was forwarded to the pre-assessment clinic for evaluation.              Medical History:   Diagnosis Date   ??? Anxiety disorder    ??? Back pain    ??? Barrett esophagus    ??? Barrett esophagus    ??? BPH (benign prostatic hyperplasia)    ??? Cataract    ??? Chronic lower back pain    ??? Depression (disease)    ??? Diverticulitis of colon (without mention of hemorrhage)(562.11)     type 2   ??? DM type 2 (diabetes mellitus, type 2) (HCC)    ??? Dyslipidemia    ??? GERD (gastroesophageal reflux disease)    ??? HLD (hyperlipidemia)    ??? HTN (hypertension)    ??? Hypothyroidism    ??? Infection    ??? Kidney stones    ??? Osteoarthritis    ??? Squamous cell carcinoma    ??? Thyroid cancer (HCC) 2009   ??? Thyroid cancer [facility])      Surgical History:   Procedure Laterality Date   ??? BACK SURGERY  1982   ??? CYSTOURETHROSCOPY  2012   ??? KIDNEY STONE SURGERY  2012   ??? COLONOSCOPY  02/14/11   ??? HX APPENDECTOMY  12/12   ??? HX JOINT REPLACEMENT     ??? HX VASECTOMY     ??? THYROIDECTOMY       Allergies   Allergen Reactions   ??? Ambien [Zolpidem] HALLUCINATIONS   ??? Statins-Hmg-Coa Reductase Inhibitors MUSCLE PAIN       Current Outpatient Medications: ???  acetaminophen (TYLENOL) 500 mg tablet, Take 500 mg by mouth as Needed for Pain. Max of 4,000 mg of acetaminophen in 24 hours. , Disp: , Rfl:   ???  ascorbic acid (VITAMIN C PO), Take 1 tablet by mouth daily., Disp: , Rfl:   ???  aspirin EC 81 mg tablet, Take 81 mg by mouth daily., Disp: , Rfl:   ???  atorvastatin (LIPITOR) 10 mg tablet, Take 1 tablet by mouth daily. Take as directed, Disp: 90 tablet, Rfl: 3  ???  blood sugar diagnostic test strip, Use 1 strip as directed before  meals and at bedtime., Disp: , Rfl:   ???  buPROPion XL (WELLBUTRIN XL) 300 mg tablet, Take 300 mg by mouth every morning. Do not crush or chew., Disp: , Rfl:   ???  cholecalciferol (Vitamin D3) (VITAMIN D-3) 1,000 units tablet, Take 1,000 Units by mouth twice daily.  , Disp: , Rfl:   ???  cyanocobalamin (VITAMIN B-12) 500 mcg tablet, Take 500 mcg by mouth daily., Disp: , Rfl:   ???  diphenoxylate/atropine (LOMOTIL) 2.5/0.025 mg tablet, Take 1 tablet by mouth four times daily as needed for Diarrhea., Disp: , Rfl:   ???  dutasteride (AVODART) 0.5 mg capsule, Take 1 Cap by mouth daily., Disp: 90 Cap, Rfl: 3  ???  empagliflozin (JARDIANCE) 10 mg tablet, Take 10 mg by mouth daily., Disp: , Rfl:   ???  Fluocinolone 0.01 % oil, Apply  topically to affected area., Disp: , Rfl:   ???  gabapentin (NEURONTIN) 600 mg tablet, Take 600 mg by mouth twice daily., Disp: , Rfl:   ???  glimepiride (AMARYL) 4 mg tablet, Take 8 mg by mouth three times daily., Disp: , Rfl:   ???  hydrocortisone 1 % topical cream, Apply  topically to affected area twice daily., Disp: , Rfl:   ???  ibuprofen (ADVIL) 200 mg tablet, Take 400 mg by mouth three times daily., Disp: , Rfl:   ???  insulin glargine (LANTUS SOLOSTAR U-100 INSULIN) 100 unit/mL (3 mL) injection PEN, Inject 40 Units under the skin daily., Disp: , Rfl:   ???  insulin lispro (HUMALOG KWIKPEN INSULIN) 100 unit/mL injection PEN, Inject 15 Units under the skin three times daily with meals., Disp: , Rfl: ???  levothyroxine (SYNTHROID) 200 mcg tablet, Take 1 Tab by mouth daily., Disp: 90 Tab, Rfl: 3  ???  metFORMIN (GLUCOPHAGE) 1,000 mg tablet, Take 1 Tab by mouth twice daily. (Patient taking differently: Take 1,000 mg by mouth daily.), Disp: 180 Tab, Rfl: 3  ???  metoprolol tartrate (LOPRESSOR) 25 mg tablet, Take 25 mg by mouth twice daily., Disp: , Rfl:   ???  omeprazole DR(+) (PRILOSEC) 40 mg capsule, Take 40 mg by mouth twice daily., Disp: , Rfl:   ???  ondansetron (ZOFRAN ODT) 4 mg rapid dissolve tablet, Take 1 Tab by mouth every 8 hours as needed for Nausea. (Patient taking differently: Dissolve 8 mg by mouth daily.), Disp: 90 Tab, Rfl: 3  ???  tamsulosin (FLOMAX) 0.4 mg capsule, Take 0.4 mg by mouth daily. Do not crush, chew or open capsules. Take 30 minutes following the same meal each day., Disp: , Rfl:   ???  vitamins, multiple (ONE-A-DAY MENS) tablet, Take 1 Tab by mouth daily.  , Disp: , Rfl:   ???  ZETIA 10 mg tablet, TAKE ONE TABLET BY MOUTH ONCE DAILY (Patient taking differently: Take 10 mg by mouth daily.), Disp: 90 Tab, Rfl: 1  family history includes Arthritis in his brother and sister; Cancer in his mother and sister; Cancer-Lung in his mother; Diabetes in his brother, father, mother, and sister; Heart Disease in his mother and sister; Hypertension in his brother and sister; Melanoma in his mother.  Social History     Socioeconomic History   ??? Marital status: Married     Spouse name: Not on file   ??? Number of children: Not on file   ??? Years of education: Not on file   ??? Highest education level: Not on file   Occupational History   ??? Not on file  Tobacco Use   ??? Smoking status: Never Smoker   ??? Smokeless tobacco: Never Used   Substance and Sexual Activity   ??? Alcohol use: Yes     Alcohol/week: 5.0 standard drinks     Types: 3 Cans of beer, 2 Shots of liquor per week   ??? Drug use: No   ??? Sexual activity: Yes   Other Topics Concern   ??? Not on file   Social History Narrative   ??? Not on file     Review of Systems Constitutional: Negative.    HENT: Negative.    Eyes: Negative.    Respiratory: Negative.    Cardiovascular: Negative.    Gastrointestinal: Negative.    Endocrine: Negative.    Genitourinary: Negative.    Musculoskeletal: Positive for back pain.   Skin: Negative.    Allergic/Immunologic: Negative.    Neurological: Negative.    Hematological: Negative.    Psychiatric/Behavioral: Negative.      Vitals:    11/17/18 1328   BP: (!) 151/98   Pulse: 95   Temp: 36.8 ???C (98.2 ???F)   TempSrc: Oral   SpO2: 98%   Weight: 112.5 kg (248 lb)   Height: 185.4 cm (72.99)   PainSc: Eight        Pain Score: Eight  Body mass index is 32.73 kg/m???.           In the presence of Dr. Jean Rosenthal, I am taking down these notes, D. Lynch, scribe.  11/17/18 @ 1:45 PM

## 2018-11-18 ENCOUNTER — Ambulatory Visit: Admit: 2018-11-17 | Discharge: 2018-11-18

## 2018-11-18 ENCOUNTER — Ambulatory Visit: Admit: 2018-11-17 | Discharge: 2018-11-17

## 2018-11-18 DIAGNOSIS — Z01810 Encounter for preprocedural cardiovascular examination: Secondary | ICD-10-CM

## 2018-11-18 DIAGNOSIS — M48062 Spinal stenosis, lumbar region with neurogenic claudication: Secondary | ICD-10-CM

## 2018-11-26 ENCOUNTER — Encounter: Admit: 2018-11-26 | Discharge: 2018-11-26

## 2018-11-26 NOTE — Telephone Encounter
Pt called to notify our office that he was hospitalized due to elevated BP and it has been suggested that he see a cardiologist for clearance prior to surgery.

## 2018-11-27 ENCOUNTER — Encounter: Admit: 2018-11-27 | Discharge: 2018-11-27

## 2018-11-27 ENCOUNTER — Ambulatory Visit: Admit: 2018-11-27 | Discharge: 2018-11-28

## 2018-11-27 DIAGNOSIS — K2270 Barrett's esophagus without dysplasia: Secondary | ICD-10-CM

## 2018-11-27 DIAGNOSIS — M545 Low back pain: Secondary | ICD-10-CM

## 2018-11-27 DIAGNOSIS — M549 Dorsalgia, unspecified: Secondary | ICD-10-CM

## 2018-11-27 DIAGNOSIS — R931 Abnormal findings on diagnostic imaging of heart and coronary circulation: Secondary | ICD-10-CM

## 2018-11-27 DIAGNOSIS — M1990 Unspecified osteoarthritis, unspecified site: Secondary | ICD-10-CM

## 2018-11-27 DIAGNOSIS — I159 Secondary hypertension, unspecified: Secondary | ICD-10-CM

## 2018-11-27 DIAGNOSIS — K5732 Diverticulitis of large intestine without perforation or abscess without bleeding: Secondary | ICD-10-CM

## 2018-11-27 DIAGNOSIS — F419 Anxiety disorder, unspecified: Secondary | ICD-10-CM

## 2018-11-27 DIAGNOSIS — C73 Malignant neoplasm of thyroid gland: Secondary | ICD-10-CM

## 2018-11-27 DIAGNOSIS — K3184 Gastroparesis: Secondary | ICD-10-CM

## 2018-11-27 DIAGNOSIS — K219 Gastro-esophageal reflux disease without esophagitis: Secondary | ICD-10-CM

## 2018-11-27 DIAGNOSIS — IMO0002 Squamous cell carcinoma: Secondary | ICD-10-CM

## 2018-11-27 DIAGNOSIS — B999 Unspecified infectious disease: Secondary | ICD-10-CM

## 2018-11-27 DIAGNOSIS — C4490 Unspecified malignant neoplasm of skin, unspecified: Secondary | ICD-10-CM

## 2018-11-27 DIAGNOSIS — N200 Calculus of kidney: Secondary | ICD-10-CM

## 2018-11-27 DIAGNOSIS — E785 Hyperlipidemia, unspecified: Secondary | ICD-10-CM

## 2018-11-27 DIAGNOSIS — I10 Essential (primary) hypertension: Secondary | ICD-10-CM

## 2018-11-27 DIAGNOSIS — N400 Enlarged prostate without lower urinary tract symptoms: Secondary | ICD-10-CM

## 2018-11-27 DIAGNOSIS — Z9889 Other specified postprocedural states: Secondary | ICD-10-CM

## 2018-11-27 DIAGNOSIS — E119 Type 2 diabetes mellitus without complications: Secondary | ICD-10-CM

## 2018-11-27 DIAGNOSIS — N62 Hypertrophy of breast: Secondary | ICD-10-CM

## 2018-11-27 DIAGNOSIS — E039 Hypothyroidism, unspecified: Secondary | ICD-10-CM

## 2018-11-27 NOTE — Patient Instructions
[facility] Nuclear Stress Test Instructions    PLEASE REPORT TO:    ____[facility] [phone], [place], [place]) - [phone]   ____[facility] ([address], [address], [place], [place]) - [phone]    ____[facility] ([address] [place], [place]) - [phone]    ____[facility] ([address], [place], [place]) - [phone]   ____[facility] ([address] [place], [place] ) - [phone]     [facility] Main Phone Number: [phone]     Date of Test  ____________  at ____________  for ________________________    Are you able to raise your arm up by your head for about 20 minutes? yes  Can you lie on your back for approximately 20 minutes with minimal movement? yes     The Thallium evaluation has two parts -- two nuclear scans.   The first scan is done in the morning and the second three to four hours later.    Wear comfortable clothing. Bring or wear comfortable walking shoes.   It is recommended not to hold infants for 2 to 3 days after the test.   Please let the nuclear technologists know if you plan on flying after the test.    NO CAFFEINE 24 HOURS PRIOR TO TEST. Examples: coffee, tea, decaf coffee or tea, cola, chocolate.     DO NOT EAT OR DRINK THE MORNING OF YOUR TEST unless otherwise instructed. (You may have a couple sips of water.) If you are a diabetic, if insulin dependent: please take one third of your insulin with a light breakfast (two pieces of dry toast and a small juice). Bring insulin and medication with you to the test.     ___ TAKE MORNING MEDICATIONS WITH A COUPLE SIPS OF WATER PRIOR TO TEST.     HOLD THE FOLLOWING MEDICATIONS AS INDICATED BELOW:      ??? Hold oral diabetic medications the morning of your test       WHAT TO DO BETWEEN THE FIRST TWO THALLIUM SCANS:  1. No strenuous exercise should be performed during this time.  2. A light lunch is permissible. The technologist will give you a list of appropriate foods. 3. Please return 15 minutes prior to the schedule of your second scan. Our nuclear technologist will tell you exactly what time to return.  4. Please do not use tobacco products in between scans.  5. After the first scan is completed, you may resume usual medications.     TEST FINDINGS:  You will receive the results of the test within 7 business days of its completion by telephone, unless arranged differently at the time of the procedure.  If you have any questions concerning your thallium test or if you do not hear from your [facility] physician/or nurse within 7 business days, please call the appropriate office checked above.      Instructions given by Allen Norris, RN

## 2018-11-28 ENCOUNTER — Encounter: Admit: 2018-11-28 | Discharge: 2018-11-28 | Payer: Commercial Managed Care - HMO

## 2018-11-28 ENCOUNTER — Encounter: Admit: 2018-11-28 | Discharge: 2018-11-28

## 2018-11-28 NOTE — Telephone Encounter
Pt calls to postpone surgery. He will need cardiac clearance and this cannot be done before surgical date. He will communicate with our office when/if cleared and can proceed.

## 2018-12-04 ENCOUNTER — Encounter: Admit: 2018-12-04 | Discharge: 2018-12-04

## 2018-12-04 NOTE — Telephone Encounter
Received VM from patients wife stating he was supposed to have an Echo yesterday but they told him to cancel it due to covid exposure. Pt is still scheduled for stress test tomorrow and she is wondering if that should be cancelled too.    Returned phone call - per wife patient was exposed at a dental office on 9/2. Advised wife that all tests and procedures should be rescheduled to after 9/17. She is agreeable. Wife reports that patient currently has no symptoms but is currently isolating just in case. Scheduling phone number given to wife - she will call today to reschedule.

## 2018-12-12 ENCOUNTER — Encounter: Admit: 2018-12-12 | Discharge: 2018-12-12 | Payer: Commercial Managed Care - HMO

## 2018-12-12 ENCOUNTER — Ambulatory Visit: Admit: 2018-12-12 | Discharge: 2018-12-13 | Payer: Commercial Managed Care - HMO

## 2018-12-12 MED ORDER — PERFLUTREN LIPID MICROSPHERES 1.1 MG/ML IV SUSP
1-20 mL | Freq: Once | INTRAVENOUS | 0 refills | Status: CP | PRN
Start: 2018-12-12 — End: ?

## 2018-12-12 NOTE — Telephone Encounter
No caffeine for 24 hrs, npo after midnight except to take a 1/3 of insulin with toast and juice, and to hold oral diabetic medications.

## 2018-12-12 NOTE — Telephone Encounter
Results and recommendations called to patient

## 2018-12-12 NOTE — Telephone Encounter
-----   Message from Nehemiah Massed, MD sent at 12/12/2018  2:31 PM CDT -----  Please send Jimmy Waters: His echo Doppler study looks favorable.  Please let him know.  I am waiting for the results of the stress test to conclude his preoperative evaluation.  Thanks.  SBG  ----- Message -----  From: Nehemiah Massed, MD  Sent: 12/12/2018   1:38 PM CDT  To: Nehemiah Massed, MD

## 2018-12-15 ENCOUNTER — Ambulatory Visit: Admit: 2018-12-15 | Discharge: 2018-12-16 | Payer: Commercial Managed Care - HMO

## 2018-12-15 ENCOUNTER — Encounter: Admit: 2018-12-15 | Discharge: 2018-12-15 | Payer: Commercial Managed Care - HMO

## 2018-12-15 MED ORDER — SODIUM CHLORIDE 0.9 % IV SOLP
250 mL | INTRAVENOUS | 0 refills | Status: AC | PRN
Start: 2018-12-15 — End: ?

## 2018-12-15 MED ORDER — ALBUTEROL SULFATE 90 MCG/ACTUATION IN HFAA
2 | RESPIRATORY_TRACT | 0 refills | Status: DC | PRN
Start: 2018-12-15 — End: 2018-12-20

## 2018-12-15 MED ORDER — AMINOPHYLLINE 500 MG/20 ML IV SOLN
50 mg | INTRAVENOUS | 0 refills | Status: AC | PRN
Start: 2018-12-15 — End: ?

## 2018-12-15 MED ORDER — EUCALYPTUS-MENTHOL MM LOZG
1 | Freq: Once | ORAL | 0 refills | Status: AC | PRN
Start: 2018-12-15 — End: ?

## 2018-12-15 MED ORDER — REGADENOSON 0.4 MG/5 ML IV SYRG
.4 mg | Freq: Once | INTRAVENOUS | 0 refills | Status: CP
Start: 2018-12-15 — End: ?

## 2018-12-15 MED ORDER — NITROGLYCERIN 0.4 MG SL SUBL
.4 mg | SUBLINGUAL | 0 refills | Status: DC | PRN
Start: 2018-12-15 — End: 2018-12-20

## 2018-12-16 ENCOUNTER — Encounter: Admit: 2018-12-16 | Discharge: 2018-12-16 | Payer: Commercial Managed Care - HMO

## 2018-12-16 ENCOUNTER — Ambulatory Visit: Admit: 2018-12-16 | Discharge: 2018-12-17 | Payer: Commercial Managed Care - HMO

## 2018-12-16 MED ORDER — TRIAMTERENE-HYDROCHLOROTHIAZID 37.5-25 MG PO TAB
.5 | ORAL_TABLET | Freq: Every morning | ORAL | 3 refills | Status: AC
Start: 2018-12-16 — End: ?

## 2018-12-16 NOTE — Progress Notes
Date of Service: 12/16/2018    Jimmy Waters is a 63 y.o. male.       HPI     Jimmy Waters is followed for diastolic heart failure and hypertension.   Over the past month he indicates that he has done well.  He was taking furosemide earlier this month but stopped taking the medication when his prescription ran out.  Otherwise, the patient has been doing well and reports no angina, congestive symptoms, palpitations, sensation of sustained forceful heart pounding, lightheadedness or syncope.  His exercise tolerance has been stable. The patient reports no myalgias, bleeding abnormalities, neurologic motor abnormalities or difficulty with speech.  He has been checking his blood pressure and his blood pressure remains elevated with systolic blood pressures of 140 and diastolic blood pressures of 85 to 90 mmHg.  He has been a diabetic for the past 20 years.  He is now insulin requiring and reports that recently his insulin was restarted.  To his knowledge, the patient has no diabetic neuropathy, nephropathy or retinopathy.  The patient reports no history of prior myocardial infarction, renal insufficiency, transient ischemic attack or stroke.  Except for being told that his chest x-ray showed pulmonary vascular congestion on 11/25/2018, he reports no prior history for congestive heart failure.  The patient does have vertigo (which responds to meclizine) along with tinnitus and chronic hearing loss.  ENT referral has been suggested to the patient.  Jimmy Waters reports that he is considering left L5-S1 laminotomy for chronic low back discomfort, especially on his left side radiating towards his left buttock.  Historically Jimmy Waters apparently was seen in the emergency room for dizziness on 11/25/2018.  A chest x-ray was obtained which showed pulmonary vascular congestion and he was started on furosemide.  He fell in July 2020.  He reports that he bruised his ribs and sternum and had some musculoskeletal pain for several weeks following his fall. He also reports chronic left shoulder discomfort and has had prior left shoulder replacement in 2014.       Vitals:    12/16/18 0932 12/16/18 0948   BP: (!) 150/92 (!) 142/90   BP Source: Arm, Left Upper Arm, Right Upper   Pulse: 88    Temp: 36.4 ?C (97.6 ?F)    SpO2: 97%    Weight: 121 kg (266 lb 12.8 oz)    Height: 1.854 m (6' 1)    PainSc: Six      Body mass index is 35.2 kg/m?Marland Kitchen     Past Medical History  Patient Active Problem List    Diagnosis Date Noted   ? Gastroesophageal reflux disease 05/06/2016     hx. of and none since fundoplication 25 yrs.ago     ? Chronic low back pain 05/06/2016   ? Dry mouth 12/01/2015   ? Postural dizziness with presyncope 12/01/2015   ? Medication side effects 10/06/2015   ? Pure hypercholesterolemia 10/06/2015   ? Agatston coronary artery calcium score greater than 400 09/14/2015   ? Obesity (BMI 30.0-34.9) 10/22/2014   ? Autonomic instability 09/20/2014   ? Chronic neck pain 05/02/2014   ? Low back pain with sciatica 05/02/2014   ? Numbness in both hands 05/02/2014   ? Insomnia 11/06/2013   ? Nephrolithiasis 05/13/2013   ? Balanitis 09/16/2012     Redness of distal glans and foreskin; no exudate or drainage     ? Lower urinary tract infectious disease 08/19/2012     Recurrent MDR Ecoli UTIs        ?  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  Persistent pneumaturia with E. Coli infections.  Treated with Macrobid and resolved but returned once antibiotic stopped.  IVP and cystoscopy performed by Dr. Larwance Rote in December 2012 both negative for evidence of fistula.  Ex lap on 03/12/11 with Dr. Larina Bras in [place], negative for definitive fistula.  Pneumaturia resolved following procedure.  Culture positive E. Coli UTI on 04/30/11, never treated.  Currently off antibiotics with intermittent RLQ and LLQ pain. ? 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 Most recently in September of 2017 he underwent neck dissection. 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 He underwent RAI in January of 2018. Post treatment scan showed some uptake on thyroid bed and mediastinal nodes. There were no enlarged nodes on CT scan.      ? E coli enteritis 12/23/2010     shiga toxin + (at [facility])     ? DM (diabetes mellitus) (HCC) 12/23/2010   ? HTN (hypertension) 12/23/2010   ? Hyperlipidemia 12/23/2010     09/06/2015 Calcium Score 2377     ? Hypothyroidism 12/23/2010     S/p thyroidectomy for thyroid cancer, s/p I- 131 treatment           Review of Systems   Constitution: Positive for malaise/fatigue and weight gain.   HENT: Positive for tinnitus.    Eyes: Negative.    Cardiovascular: Positive for dyspnea on exertion and leg swelling.   Endocrine: Negative.    Hematologic/Lymphatic: Negative.    Skin: Negative.    Musculoskeletal: Positive for arthritis, back pain, falls and joint pain.   Gastrointestinal: Positive for bloating.   Genitourinary: Negative.    Neurological: Positive for numbness, paresthesias and weakness.   Psychiatric/Behavioral: Negative.    Allergic/Immunologic: Negative.        Physical Exam   GENERAL: The patient is well developed, well nourished, resting comfortably and in no distress.  No frailty  HEENT: No abnormalities of the visible oro-nasopharynx, conjunctiva or sclera are noted.  NECK: There is no jugular venous distension. Carotids are palpable and without bruits. There is no thyroid enlargement.  Chest: Lung fields are clear to auscultation. There are no wheezes or crackles.  CV: There is a regular rhythm. The first and second heart sounds are normal. There are no murmurs, gallops or rubs. ABD: The abdomen is soft and supple with normal bowel sounds. There is no hepatosplenomegaly, ascites, tenderness, masses or bruits.  Neuro: There are no focal motor defects. Ambulation is normal. Cognitive function appears normal.  Ext: There is trace to 1+ bipedal and lower extremity edema without evidence of deep vein thrombosis. Peripheral pulses are satisfactory.    SKIN: There are no rashes and no cellulitis  PSYCH: The patient is calm, rationale and oriented.    Cardiovascular Studies  A twelve-lead ECG obtained on November 25, 2018 reveals sinus tachycardia with a heart rate of 100 bpm.  Left anterior fascicular block is noted.  There is no evidence of myocardial ischemia or infarction.  Labs from 11/17/2018 revealed serum creatinine 0.79 mg/dL and serum potassium 3.6 mmol/L.  His troponin I on 11/25/2018 was normal at less than 0.01.  His hemoglobin A1c was 8.7%.  His ALT = 41.  His total cholesterol was 158, triglycerides 203, HDL 41 and LDL cholesterol 76 mg/dL.  His TSH was 0.72.  Outside chest x-ray report from 11/25/2018 revealed mild cardiomegaly.  Pulmonary valve congestion.  No pneumothorax.  Bilateral reverse shoulder arthroplasties.    Echo Doppler 12/12/2018:  1. No regional wall motion abnormalities are seen. Overall LV systolic function appears normal. The estimated left ventricular ejection fraction is 60%.  2. There is mild concentric left ventricular hypertrophy.  3. Normal left ventricular diastolic function.   4. Right ventricular contractility appears normal.  5. Normal chamber dimensions.  6. Cardiac valve structures were not visualized well. There is no evidence of significant valvular regurgitation or stenosis by doppler exam.  7. No pericardial effusion is seen.      Regadenoson thallium stress test 12/15/2018:  Scintigraphic (planar/tomographic):  There is a small sized mild intensity reversible perfusion defect seen in the apical inferolateral wall.  All myocardial segments appear viable. Polar coordinate map identifies small apical perfusion defect. TID Ratio:  1.11  (normal <1.36). ?  Summed Stress Score:  3   , Summed Rest Score:  0. Regional Wall Thickening and Motion Post Stress:  ?There is normal left ventricular wall motion and thickening of all myocardial segments. Left Ventricular Ejection Fraction (post stress, in the resting state) =? 50 %. ?  Left Ventricular End Diastolic Volume: 76 mL SUMMARY/OPINION:??This study is mildly abnormal with minimal inferoapical lateral defect concerning for ischemia.  Left ventricular systolic function is borderline at 50%. There are no high risk prognostic indicators present.  The pharmacologic ECG portion of the study is negative for ischemia.  Sum stress score was 3. Comparison is made with a prior regadenoson thallium stress study completed 09/15/2015.  Ejection fraction was 63%.  Previous study was reportedly normal.  Images were not available for direct comparison. In aggregate the current study is low risk in regards to predicted annual cardiovascular mortality rate.    Problems Addressed Today  Essential hypertension.  Diastolic heart failure.  Assessment and Plan     1) alternatives for the treatment of hypertension were reviewed with the patient.  He wanted to start hydrochlorothiazide 25 mg/triamterene 37.5 mg, taking 1/2 tablet daily. I have asked the patient to keep a log book of his BP readings and to report systolic BP readings exceeding 130 mm Hg., or diastolic blood pressures exceeding 80 mmHg.  I have asked him to contact our office within the next 2 weeks, regardless, to report his blood pressure readings.  2) I see no cardiac reason why he cannot participate in physical therapy.  3) the results of his echo Doppler and regadenoson thallium stress test were reviewed with the patient in detail.  The option of coronary angiography/intervention was presented to the patient which he politely declined due to the small mild defect noted on his stress test and the absence of angina or anginal equivalent symptoms.  4) Based upon the results of the patient's clinical profile and non-invasive cardiovascular testing, the risk for cardiovascular morbidity and mortality associated with non-cardiac surgery is ELEVATED. This by no means precludes surgery but provides information for the surgeon, anesthesiologist and patient concerning the risks of surgery.  A decision to proceed with surgery should be made based upon the relative benefits and risks involved. Indeed, back surgery could be a very helpful intervention for this patient's overall health maintenance and could prove very helpful for improvement in his exercise tolerance and cardiovascular stability. There are no absolute contra-indications  to elective surgery, as deemed necessary. No additional cardiovascular testing is required at this time.  The patient would have to accept the risk of holding his aspirin in the perioperative period if required by his surgical team.  If his aspirin is held in the perioperative period, then I would recommend holding it for as short of a period as possible, restarting his aspirin soon as his bleeding risk is no longer prohibitive.  5) I have asked JimmySymonette to return for follow-up in 3 months to review his blood pressure readings.             Current Medications (including today's revisions)  ? acetaminophen (TYLENOL) 500 mg tablet Take 500 mg by mouth as Needed for Pain. Max of 4,000 mg of acetaminophen in 24 hours.    ? albuterol sulfate (PROAIR HFA) 90 mcg/actuation aerosol inhaler Inhale 2 puffs by mouth into the lungs as Needed.   ? ascorbic acid (VITAMIN C PO) Take 1 tablet by mouth daily.   ? aspirin EC 81 mg tablet Take 81 mg by mouth daily.   ? atorvastatin (LIPITOR) 10 mg tablet Take 1 tablet by mouth daily. Take as directed (Patient taking differently: Take 20 mg by mouth daily. Take as directed) ? blood sugar diagnostic test strip Use 1 strip as directed before meals and at bedtime.   ? buPROPion XL (WELLBUTRIN XL) 300 mg tablet Take 300 mg by mouth every morning. Do not crush or chew.   ? cholecalciferol (Vitamin D3) (VITAMIN D-3) 1,000 units tablet Take 1,000 Units by mouth twice daily.     ? cyanocobalamin (VITAMIN B-12) 500 mcg tablet Take 500 mcg by mouth daily.   ? dutasteride (AVODART) 0.5 mg capsule Take 1 Cap by mouth daily.   ? Fluocinolone 0.01 % oil Apply  topically to affected area.   ? gabapentin (NEURONTIN) 600 mg tablet Take 600 mg by mouth three times daily.   ? glimepiride (AMARYL) 4 mg tablet Take 8 mg by mouth twice daily.   ? hydrocortisone 1 % topical cream Apply  topically to affected area twice daily.   ? ibuprofen (ADVIL) 200 mg tablet Take 400 mg by mouth every 6 hours as needed.   ? insulin glargine (LANTUS SOLOSTAR U-100 INSULIN) 100 unit/mL (3 mL) injection PEN Inject 40 Units under the skin at bedtime daily.   ? insulin lispro (HUMALOG KWIKPEN INSULIN) 100 unit/mL injection PEN Inject 15 Units under the skin three times daily with meals.   ? JARDIANCE 25 mg tablet Take 1 tablet by mouth daily.   ? levothyroxine (SYNTHROID) 200 mcg tablet Take 1 Tab by mouth daily.   ? metFORMIN (GLUCOPHAGE) 1,000 mg tablet Take 1 Tab by mouth twice daily.   ? metoprolol tartrate (LOPRESSOR) 37.5 mg tablet Take 75 mg by mouth daily.   ? omeprazole DR(+) (PRILOSEC) 40 mg capsule Take 40 mg by mouth twice daily.   ? ondansetron (ZOFRAN ODT) 8 mg rapid dissolve tablet Dissolve 1 tablet by mouth as Needed.   ? SITagliptin (JANUVIA) 100 mg tab tablet Take 100 mg by mouth daily.   ? tamsulosin (FLOMAX) 0.4 mg capsule Take 0.4 mg by mouth daily. Do not crush, chew or open capsules. Take 30 minutes following the same meal each day.   ? triamterene-hydrochlorothiazide (MAXZIDE) 37.5-25 mg tablet Take one-half tablet by mouth every morning. ? vitamins, multiple (ONE-A-DAY MENS) tablet Take 1 Tab by mouth daily.     ? ZETIA 10 mg tablet TAKE ONE  TABLET BY MOUTH ONCE DAILY (Patient taking differently: Take 10 mg by mouth daily.)

## 2018-12-19 ENCOUNTER — Encounter: Admit: 2018-12-19 | Discharge: 2018-12-19 | Payer: Commercial Managed Care - HMO

## 2018-12-19 NOTE — Telephone Encounter
Pt is cleared from cardiology to proceed with surgery. There is risk involved, but will not exclude from surgery

## 2018-12-25 ENCOUNTER — Encounter: Admit: 2018-12-25 | Discharge: 2018-12-25 | Payer: Commercial Managed Care - HMO

## 2018-12-25 DIAGNOSIS — Z1159 Encounter for screening for other viral diseases: Secondary | ICD-10-CM

## 2018-12-25 DIAGNOSIS — Z01818 Encounter for other preprocedural examination: Secondary | ICD-10-CM

## 2018-12-25 NOTE — Telephone Encounter
-----   Message from Rosaria Ferries, LPN sent at [id number]  1:43 PM CDT -----  Regarding: Nursing visit for blood pressure check  Contacted patient to move nursing visit for blood pressure check. Patient originally scheduled for 12/30/2018 in [place]. No provider scheduled to be in [facility] that day. Offered patient an appointment for nurse blood pressure check on 01/01/2019 in [facility] [place]. Patient stated he is having surgery on 01/02/2019 and has been told to quarantine 2 days prior to surgery. Offered patient an appointment with nurse on 12/29/2018 in [place] to check blood pressure. Patient declined. Offered patient an appointment for following week in [facility] [place] on 10/13 or 10/15 with [place] nurse to check blood pressure, patient also declined. Patient stated he has been checking his blood pressure at home daily and would prefer to call after his surgery date to either schedule a nursing visit for his blood pressure check or call in with his blood pressure readings. Patient stated his blood pressure has been running well and his reading today is 132/82 with a pulse of 85.

## 2018-12-25 NOTE — Telephone Encounter
Called pt to obtain bp readings.  He recently started triam/HCTZ 37.5/25 mg taking 1/2 tab every morning.      Report bp readings:    9/30 138/82 p  85  9/29 123/75     96  9/28 177/93      110  9/27 136/85       89  9/26 116/78      75    9/25      137/86      81    Will route to [facility] for review.

## 2019-01-01 LAB — COVID-19 (SARS-COV-2) PCR

## 2019-01-02 ENCOUNTER — Encounter: Admit: 2019-01-02 | Discharge: 2019-01-02 | Payer: Commercial Managed Care - HMO

## 2019-01-02 DIAGNOSIS — M545 Low back pain: Secondary | ICD-10-CM

## 2019-01-02 DIAGNOSIS — C73 Malignant neoplasm of thyroid gland: Secondary | ICD-10-CM

## 2019-01-02 DIAGNOSIS — E039 Hypothyroidism, unspecified: Secondary | ICD-10-CM

## 2019-01-02 DIAGNOSIS — IMO0002 Squamous cell carcinoma: Secondary | ICD-10-CM

## 2019-01-02 DIAGNOSIS — N400 Enlarged prostate without lower urinary tract symptoms: Secondary | ICD-10-CM

## 2019-01-02 DIAGNOSIS — N200 Calculus of kidney: Secondary | ICD-10-CM

## 2019-01-02 DIAGNOSIS — E119 Type 2 diabetes mellitus without complications: Secondary | ICD-10-CM

## 2019-01-02 DIAGNOSIS — K5732 Diverticulitis of large intestine without perforation or abscess without bleeding: Secondary | ICD-10-CM

## 2019-01-02 DIAGNOSIS — N62 Hypertrophy of breast: Secondary | ICD-10-CM

## 2019-01-02 DIAGNOSIS — K3184 Gastroparesis: Secondary | ICD-10-CM

## 2019-01-02 DIAGNOSIS — K219 Gastro-esophageal reflux disease without esophagitis: Secondary | ICD-10-CM

## 2019-01-02 DIAGNOSIS — E785 Hyperlipidemia, unspecified: Secondary | ICD-10-CM

## 2019-01-02 DIAGNOSIS — M549 Dorsalgia, unspecified: Secondary | ICD-10-CM

## 2019-01-02 DIAGNOSIS — M1990 Unspecified osteoarthritis, unspecified site: Secondary | ICD-10-CM

## 2019-01-02 DIAGNOSIS — B999 Unspecified infectious disease: Secondary | ICD-10-CM

## 2019-01-02 DIAGNOSIS — I10 Essential (primary) hypertension: Secondary | ICD-10-CM

## 2019-01-02 DIAGNOSIS — Z9889 Other specified postprocedural states: Secondary | ICD-10-CM

## 2019-01-02 DIAGNOSIS — F419 Anxiety disorder, unspecified: Secondary | ICD-10-CM

## 2019-01-02 DIAGNOSIS — C4490 Unspecified malignant neoplasm of skin, unspecified: Secondary | ICD-10-CM

## 2019-01-02 DIAGNOSIS — K2270 Barrett's esophagus without dysplasia: Secondary | ICD-10-CM

## 2019-01-02 LAB — POC GLUCOSE
Lab: 186 mg/dL — ABNORMAL HIGH (ref 70–100)
Lab: 228 mg/dL — ABNORMAL HIGH (ref 70–100)
Lab: 248 mg/dL — ABNORMAL HIGH (ref 70–100)
Lab: 173 mg/dL — ABNORMAL HIGH (ref 70–100)

## 2019-01-02 MED ORDER — HYDROMORPHONE (PF) 2 MG/ML IJ SYRG
0 refills | Status: DC
Start: 2019-01-02 — End: 2019-01-02
  Administered 2019-01-02: 12:00:00 0.5 mg via INTRAVENOUS

## 2019-01-02 MED ORDER — TAMSULOSIN 0.4 MG PO CAP
.4 mg | Freq: Every day | ORAL | 0 refills | Status: DC
Start: 2019-01-02 — End: 2019-01-04
  Administered 2019-01-03 – 2019-01-04 (×2): 0.4 mg via ORAL

## 2019-01-02 MED ORDER — ACETAMINOPHEN 650 MG RE SUPP
650 mg | RECTAL | 0 refills | Status: DC | PRN
Start: 2019-01-02 — End: 2019-01-03

## 2019-01-02 MED ORDER — LIDOCAINE (PF) 10 MG/ML (1 %) IJ SOLN
.1-2 mL | INTRAMUSCULAR | 0 refills | Status: DC | PRN
Start: 2019-01-02 — End: 2019-01-02

## 2019-01-02 MED ORDER — OXYCODONE SR 10 MG PO 12HR TABLET
10 mg | Freq: Once | ORAL | 0 refills | Status: CP
Start: 2019-01-02 — End: ?
  Administered 2019-01-02: 12:00:00 10 mg via ORAL

## 2019-01-02 MED ORDER — ONDANSETRON HCL (PF) 4 MG/2 ML IJ SOLN
INTRAVENOUS | 0 refills | Status: DC
Start: 2019-01-02 — End: 2019-01-02
  Administered 2019-01-02: 14:00:00 4 mg via INTRAVENOUS

## 2019-01-02 MED ORDER — CEFAZOLIN INJ 1GM IVP
2 g | INTRAVENOUS | 0 refills | Status: CP
Start: 2019-01-02 — End: ?
  Administered 2019-01-02 – 2019-01-03 (×2): 2 g via INTRAVENOUS

## 2019-01-02 MED ORDER — ACETAMINOPHEN 500 MG PO TAB
1000 mg | Freq: Once | ORAL | 0 refills | Status: CP
Start: 2019-01-02 — End: ?
  Administered 2019-01-02: 12:00:00 1000 mg via ORAL

## 2019-01-02 MED ORDER — FENTANYL CITRATE (PF) 50 MCG/ML IJ SOLN
25-50 ug | INTRAVENOUS | 0 refills | Status: DC | PRN
Start: 2019-01-02 — End: 2019-01-02
  Administered 2019-01-02: 15:00:00 50 ug via INTRAVENOUS

## 2019-01-02 MED ORDER — FENTANYL PCA 500 MCG/50 ML SYR (STD CONC)(ADULT)(PREMADE)
INTRAVENOUS | 0 refills | Status: DC
Start: 2019-01-02 — End: 2019-01-03
  Administered 2019-01-02 – 2019-01-03 (×3): 50.000 mL via INTRAVENOUS

## 2019-01-02 MED ORDER — LIDOCAINE (PF) 200 MG/10 ML (2 %) IJ SYRG
0 refills | Status: DC
Start: 2019-01-02 — End: 2019-01-02
  Administered 2019-01-02: 13:00:00 100 mg via INTRAVENOUS

## 2019-01-02 MED ORDER — DOCUSATE SODIUM 100 MG PO CAP
100 mg | Freq: Two times a day (BID) | ORAL | 0 refills | Status: DC
Start: 2019-01-02 — End: 2019-01-04
  Administered 2019-01-02 – 2019-01-04 (×3): 100 mg via ORAL

## 2019-01-02 MED ORDER — HALOPERIDOL LACTATE 5 MG/ML IJ SOLN
1 mg | Freq: Once | INTRAVENOUS | 0 refills | Status: CP | PRN
Start: 2019-01-02 — End: ?
  Administered 2019-01-02: 15:00:00 1 mg via INTRAVENOUS

## 2019-01-02 MED ORDER — ATORVASTATIN 20 MG PO TAB
20 mg | Freq: Every day | ORAL | 0 refills | Status: DC
Start: 2019-01-02 — End: 2019-01-04
  Administered 2019-01-03 – 2019-01-04 (×2): 20 mg via ORAL

## 2019-01-02 MED ORDER — DIPHENHYDRAMINE HCL 25 MG PO CAP
25 mg | ORAL | 0 refills | Status: DC | PRN
Start: 2019-01-02 — End: 2019-01-04
  Administered 2019-01-04: 03:00:00 25 mg via ORAL

## 2019-01-02 MED ORDER — REMIFENTANYL 1000MCG IN NS 20ML (OR)
0 refills | Status: DC
Start: 2019-01-02 — End: 2019-01-02
  Administered 2019-01-02: 13:00:00 .02 ug/kg/min via INTRAVENOUS
  Administered 2019-01-02: 13:00:00 150 ug via INTRAVENOUS
  Administered 2019-01-02: 13:00:00 .02 ug/kg/min via INTRAVENOUS
  Administered 2019-01-02: 13:00:00 150 ug via INTRAVENOUS

## 2019-01-02 MED ORDER — INSULIN REGULAR HUMAN(#) 1 UNIT/ML IJ SYRINGE
10 [IU] | Freq: Once | INTRAVENOUS | 0 refills | Status: CP
Start: 2019-01-02 — End: ?
  Administered 2019-01-02: 17:00:00 10 [IU] via INTRAVENOUS

## 2019-01-02 MED ORDER — DUTASTERIDE 0.5 MG PO CAP
0.5 mg | Freq: Every day | ORAL | 0 refills | Status: DC
Start: 2019-01-02 — End: 2019-01-04
  Administered 2019-01-03 – 2019-01-04 (×2): 0.5 mg via ORAL

## 2019-01-02 MED ORDER — PHENYLEPHRINE 40 MCG/ML IN NS IV DRIP (STD CONC)
0 refills | Status: DC
Start: 2019-01-02 — End: 2019-01-02
  Administered 2019-01-02 (×2): .3 ug/kg/min via INTRAVENOUS

## 2019-01-02 MED ORDER — NALOXONE 0.4 MG/ML IJ SOLN
.08 mg | INTRAVENOUS | 0 refills | Status: DC | PRN
Start: 2019-01-02 — End: 2019-01-04

## 2019-01-02 MED ORDER — CHOLECALCIFEROL (VITAMIN D3) 25 MCG (1,000 UNIT) PO TAB
1000 [IU] | Freq: Two times a day (BID) | ORAL | 0 refills | Status: DC
Start: 2019-01-02 — End: 2019-01-04
  Administered 2019-01-03 – 2019-01-04 (×3): 1000 [IU] via ORAL

## 2019-01-02 MED ORDER — BUPIVACAINE 0.5 % (5 MG/ML) IJ SOLN
0 refills | Status: DC
Start: 2019-01-02 — End: 2019-01-02
  Administered 2019-01-02: 14:00:00 30 mL via INTRAMUSCULAR

## 2019-01-02 MED ORDER — EZETIMIBE 10 MG PO TAB
10 mg | Freq: Every day | ORAL | 0 refills | Status: DC
Start: 2019-01-02 — End: 2019-01-04
  Administered 2019-01-03 – 2019-01-04 (×2): 10 mg via ORAL

## 2019-01-02 MED ORDER — SUGAMMADEX 100 MG/ML IV SOLN
INTRAVENOUS | 0 refills | Status: DC
Start: 2019-01-02 — End: 2019-01-02
  Administered 2019-01-02: 15:00:00 200 mg via INTRAVENOUS

## 2019-01-02 MED ORDER — LACTATED RINGERS IV SOLP
INTRAVENOUS | 0 refills | Status: AC
Start: 2019-01-02 — End: ?
  Administered 2019-01-02 – 2019-01-03 (×3): 1000.000 mL via INTRAVENOUS

## 2019-01-02 MED ORDER — ACETAMINOPHEN 325 MG PO TAB
650 mg | ORAL | 0 refills | Status: DC | PRN
Start: 2019-01-02 — End: 2019-01-03
  Administered 2019-01-03: 01:00:00 650 mg via ORAL

## 2019-01-02 MED ORDER — ONDANSETRON HCL (PF) 4 MG/2 ML IJ SOLN
4 mg | INTRAVENOUS | 0 refills | Status: DC | PRN
Start: 2019-01-02 — End: 2019-01-03
  Administered 2019-01-03: 14:00:00 4 mg via INTRAVENOUS

## 2019-01-02 MED ORDER — LACTATED RINGERS IV SOLP
1000 mL | INTRAVENOUS | 0 refills | Status: AC
Start: 2019-01-02 — End: ?
  Administered 2019-01-02: 12:00:00 1000 mL via INTRAVENOUS

## 2019-01-02 MED ORDER — PHENYLEPHRINE IN 0.9% NACL(PF) 1 MG/10 ML (100 MCG/ML) IV SYRG
INTRAVENOUS | 0 refills | Status: DC
Start: 2019-01-02 — End: 2019-01-02
  Administered 2019-01-02 (×7): 100 ug via INTRAVENOUS

## 2019-01-02 MED ORDER — CYANOCOBALAMIN (VITAMIN B-12) 500 MCG PO TAB
500 ug | Freq: Every day | ORAL | 0 refills | Status: DC
Start: 2019-01-02 — End: 2019-01-04
  Administered 2019-01-03 – 2019-01-04 (×2): 500 ug via ORAL

## 2019-01-02 MED ORDER — PROPOFOL INJ 10 MG/ML IV VIAL
0 refills | Status: DC
Start: 2019-01-02 — End: 2019-01-02
  Administered 2019-01-02: 13:00:00 40 mg via INTRAVENOUS
  Administered 2019-01-02: 13:00:00 20 mg via INTRAVENOUS
  Administered 2019-01-02 (×2): 30 mg via INTRAVENOUS

## 2019-01-02 MED ORDER — DIPHENHYDRAMINE HCL 50 MG/ML IJ SOLN
25 mg | INTRAVENOUS | 0 refills | Status: DC | PRN
Start: 2019-01-02 — End: 2019-01-04

## 2019-01-02 MED ORDER — LEVOTHYROXINE 200 MCG PO TAB
200 ug | Freq: Every day | ORAL | 0 refills | Status: DC
Start: 2019-01-02 — End: 2019-01-04
  Administered 2019-01-03 – 2019-01-04 (×2): 200 ug via ORAL

## 2019-01-02 MED ORDER — INSULIN GLARGINE 100 UNIT/ML (3 ML) SC INJ PEN
20 [IU] | Freq: Every evening | SUBCUTANEOUS | 0 refills | Status: DC
Start: 2019-01-02 — End: 2019-01-04
  Administered 2019-01-03 – 2019-01-04 (×2): 20 [IU] via SUBCUTANEOUS

## 2019-01-02 MED ORDER — MAGNESIUM HYDROXIDE 2,400 MG/10 ML PO SUSP
10 mL | Freq: Every day | ORAL | 0 refills | Status: DC
Start: 2019-01-02 — End: 2019-01-04

## 2019-01-02 MED ORDER — GLIMEPIRIDE 4 MG PO TAB
8 mg | Freq: Two times a day (BID) | ORAL | 0 refills | Status: DC
Start: 2019-01-02 — End: 2019-01-04
  Administered 2019-01-03 – 2019-01-04 (×3): 8 mg via ORAL

## 2019-01-02 MED ORDER — BISACODYL 10 MG RE SUPP
10 mg | Freq: Every day | RECTAL | 0 refills | Status: DC | PRN
Start: 2019-01-02 — End: 2019-01-04

## 2019-01-02 MED ORDER — DEXAMETHASONE SODIUM PHOSPHATE 4 MG/ML IJ SOLN
INTRAVENOUS | 0 refills | Status: DC
Start: 2019-01-02 — End: 2019-01-02
  Administered 2019-01-02: 13:00:00 4 mg via INTRAVENOUS

## 2019-01-02 MED ORDER — OXYCODONE 5 MG PO TAB
5-10 mg | Freq: Once | ORAL | 0 refills | Status: DC | PRN
Start: 2019-01-02 — End: 2019-01-02

## 2019-01-02 MED ORDER — METFORMIN 500 MG PO TAB
1000 mg | Freq: Two times a day (BID) | ORAL | 0 refills | Status: DC
Start: 2019-01-02 — End: 2019-01-04
  Administered 2019-01-03 – 2019-01-04 (×3): 1000 mg via ORAL

## 2019-01-02 MED ORDER — METOPROLOL TARTRATE 50 MG PO TAB
50 mg | Freq: Two times a day (BID) | ORAL | 0 refills | Status: DC
Start: 2019-01-02 — End: 2019-01-04
  Administered 2019-01-03 – 2019-01-04 (×4): 50 mg via ORAL

## 2019-01-02 MED ORDER — MIDAZOLAM 1 MG/ML IJ SOLN
INTRAVENOUS | 0 refills | Status: DC
Start: 2019-01-02 — End: 2019-01-02
  Administered 2019-01-02 (×2): 6 mg via INTRAVENOUS
  Administered 2019-01-02: 12:00:00 1 mg via INTRAVENOUS
  Administered 2019-01-02: 15:00:00 10 mg via INTRAVENOUS

## 2019-01-02 MED ORDER — CEFAZOLIN 1 GRAM IJ SOLR
0 refills | Status: DC
Start: 2019-01-02 — End: 2019-01-02
  Administered 2019-01-02: 13:00:00 3 g via INTRAVENOUS

## 2019-01-02 MED ORDER — PANTOPRAZOLE 40 MG PO TBEC
40 mg | Freq: Two times a day (BID) | ORAL | 0 refills | Status: DC
Start: 2019-01-02 — End: 2019-01-04
  Administered 2019-01-02 – 2019-01-04 (×4): 40 mg via ORAL

## 2019-01-02 MED ORDER — ALBUTEROL SULFATE 90 MCG/ACTUATION IN HFAA
2 | RESPIRATORY_TRACT | 0 refills | Status: DC | PRN
Start: 2019-01-02 — End: 2019-01-04

## 2019-01-02 MED ORDER — ROCURONIUM 10 MG/ML IV SOLN
INTRAVENOUS | 0 refills | Status: DC
Start: 2019-01-02 — End: 2019-01-02
  Administered 2019-01-02: 13:00:00 50 mg via INTRAVENOUS

## 2019-01-02 MED ORDER — INSULIN REGULAR HUMAN(#) 1 UNIT/ML IJ SYRINGE
5 [IU] | Freq: Once | INTRAVENOUS | 0 refills | Status: CP
Start: 2019-01-02 — End: ?
  Administered 2019-01-02: 16:00:00 5 [IU] via INTRAVENOUS

## 2019-01-02 MED ORDER — ASCORBIC ACID (VITAMIN C) 500 MG PO TAB
1000 mg | Freq: Every day | ORAL | 0 refills | Status: DC
Start: 2019-01-02 — End: 2019-01-04
  Administered 2019-01-03 – 2019-01-04 (×2): 1000 mg via ORAL

## 2019-01-02 MED ORDER — GABAPENTIN 300 MG PO CAP
600 mg | Freq: Three times a day (TID) | ORAL | 0 refills | Status: DC
Start: 2019-01-02 — End: 2019-01-04
  Administered 2019-01-02 – 2019-01-04 (×6): 600 mg via ORAL

## 2019-01-02 MED ORDER — BUPROPION XL 300 MG PO TB24
300 mg | Freq: Every morning | ORAL | 0 refills | Status: DC
Start: 2019-01-02 — End: 2019-01-04
  Administered 2019-01-03 – 2019-01-04 (×2): 300 mg via ORAL

## 2019-01-02 MED ORDER — LACTATED RINGERS IV SOLP
0 refills | Status: DC
Start: 2019-01-02 — End: 2019-01-02
  Administered 2019-01-02: 12:00:00 via INTRAVENOUS

## 2019-01-02 MED ORDER — THROMBIN (BOVINE) 5,000 UNIT TP SOLR
0 refills | Status: DC
Start: 2019-01-02 — End: 2019-01-02
  Administered 2019-01-02: 14:00:00 5000 [IU] via TOPICAL

## 2019-01-02 MED ORDER — TRIAMTERENE-HYDROCHLOROTHIAZID 37.5-25 MG PO TAB
.5 | Freq: Every morning | ORAL | 0 refills | Status: DC
Start: 2019-01-02 — End: 2019-01-04
  Administered 2019-01-03 – 2019-01-04 (×2): 0.5 via ORAL

## 2019-01-03 ENCOUNTER — Encounter: Admit: 2019-01-03 | Discharge: 2019-01-03 | Payer: PRIVATE HEALTH INSURANCE

## 2019-01-03 LAB — BASIC METABOLIC PANEL
Lab: 101 MMOL/L — ABNORMAL HIGH (ref 98–110)
Lab: 138 MMOL/L — ABNORMAL HIGH (ref 137–147)
Lab: 3.8 MMOL/L (ref 3.5–5.1)

## 2019-01-03 LAB — POC GLUCOSE
Lab: 169 mg/dL — ABNORMAL HIGH (ref 70–100)
Lab: 179 mg/dL — ABNORMAL HIGH (ref 70–100)

## 2019-01-03 LAB — CBC AND DIFF: Lab: 9.2 K/UL (ref >60)

## 2019-01-03 MED ORDER — OXYCODONE 5 MG PO TAB
5-10 mg | ORAL | 0 refills | Status: DC | PRN
Start: 2019-01-03 — End: 2019-01-04
  Administered 2019-01-03 – 2019-01-04 (×8): 10 mg via ORAL

## 2019-01-03 MED ORDER — SENNOSIDES-DOCUSATE SODIUM 8.6-50 MG PO TAB
1 | ORAL_TABLET | Freq: Every day | ORAL | 0 refills | Status: AC
Start: 2019-01-03 — End: ?
  Filled 2019-01-04: qty 20, 20d supply, fill #1

## 2019-01-03 MED ORDER — ONDANSETRON HCL (PF) 4 MG/2 ML IJ SOLN
4-8 mg | INTRAVENOUS | 0 refills | Status: DC | PRN
Start: 2019-01-03 — End: 2019-01-04
  Administered 2019-01-03 – 2019-01-04 (×3): 4 mg via INTRAVENOUS

## 2019-01-03 MED ORDER — ACETAMINOPHEN 500 MG PO TAB
1000 mg | ORAL_TABLET | ORAL | 0 refills | Status: DC
Start: 2019-01-03 — End: 2019-01-21
  Filled 2019-01-04: qty 180, 23d supply, fill #1

## 2019-01-03 MED ORDER — ACETAMINOPHEN 500 MG PO TAB
1000 mg | ORAL | 0 refills | Status: DC
Start: 2019-01-03 — End: 2019-01-04
  Administered 2019-01-03 – 2019-01-04 (×4): 1000 mg via ORAL

## 2019-01-03 MED ORDER — OXYCODONE 5 MG PO TAB
5-10 mg | ORAL_TABLET | ORAL | 0 refills | 6.00000 days | Status: DC | PRN
Start: 2019-01-03 — End: 2019-01-21

## 2019-01-03 MED ORDER — OXYCODONE 5 MG PO TAB
5-10 mg | ORAL_TABLET | ORAL | 0 refills | 6.00000 days | Status: DC | PRN
Start: 2019-01-03 — End: 2019-01-15

## 2019-01-03 MED ORDER — OXYCODONE 5 MG PO TAB
5-10 mg | ORAL_TABLET | ORAL | 0 refills | 6.00000 days | Status: DC | PRN
Start: 2019-01-03 — End: 2019-01-15
  Filled 2019-01-04: qty 90, 6d supply, fill #1

## 2019-01-04 ENCOUNTER — Encounter: Admit: 2019-01-04 | Discharge: 2019-01-04 | Payer: PRIVATE HEALTH INSURANCE

## 2019-01-04 DIAGNOSIS — M1990 Unspecified osteoarthritis, unspecified site: Secondary | ICD-10-CM

## 2019-01-04 DIAGNOSIS — C4490 Unspecified malignant neoplasm of skin, unspecified: Secondary | ICD-10-CM

## 2019-01-04 DIAGNOSIS — M545 Low back pain: Secondary | ICD-10-CM

## 2019-01-04 DIAGNOSIS — K5732 Diverticulitis of large intestine without perforation or abscess without bleeding: Secondary | ICD-10-CM

## 2019-01-04 DIAGNOSIS — K3184 Gastroparesis: Secondary | ICD-10-CM

## 2019-01-04 DIAGNOSIS — E039 Hypothyroidism, unspecified: Secondary | ICD-10-CM

## 2019-01-04 DIAGNOSIS — N62 Hypertrophy of breast: Secondary | ICD-10-CM

## 2019-01-04 DIAGNOSIS — N200 Calculus of kidney: Secondary | ICD-10-CM

## 2019-01-04 DIAGNOSIS — I10 Essential (primary) hypertension: Secondary | ICD-10-CM

## 2019-01-04 DIAGNOSIS — C73 Malignant neoplasm of thyroid gland: Secondary | ICD-10-CM

## 2019-01-04 DIAGNOSIS — F419 Anxiety disorder, unspecified: Secondary | ICD-10-CM

## 2019-01-04 DIAGNOSIS — K219 Gastro-esophageal reflux disease without esophagitis: Secondary | ICD-10-CM

## 2019-01-04 DIAGNOSIS — B999 Unspecified infectious disease: Secondary | ICD-10-CM

## 2019-01-04 DIAGNOSIS — N400 Enlarged prostate without lower urinary tract symptoms: Secondary | ICD-10-CM

## 2019-01-04 DIAGNOSIS — E119 Type 2 diabetes mellitus without complications: Secondary | ICD-10-CM

## 2019-01-04 DIAGNOSIS — IMO0002 Squamous cell carcinoma: Secondary | ICD-10-CM

## 2019-01-04 DIAGNOSIS — K2270 Barrett's esophagus without dysplasia: Secondary | ICD-10-CM

## 2019-01-04 DIAGNOSIS — M549 Dorsalgia, unspecified: Secondary | ICD-10-CM

## 2019-01-04 DIAGNOSIS — E785 Hyperlipidemia, unspecified: Secondary | ICD-10-CM

## 2019-01-04 DIAGNOSIS — Z9889 Other specified postprocedural states: Secondary | ICD-10-CM

## 2019-01-04 LAB — POC GLUCOSE: Lab: 148 mg/dL — ABNORMAL HIGH (ref 70–100)

## 2019-01-04 LAB — BASIC METABOLIC PANEL
Lab: 101 MMOL/L — ABNORMAL LOW (ref >60)
Lab: 135 MMOL/L — ABNORMAL LOW (ref >60)

## 2019-01-12 ENCOUNTER — Encounter: Admit: 2019-01-12 | Discharge: 2019-01-12 | Payer: PRIVATE HEALTH INSURANCE

## 2019-01-12 MED ORDER — CEPHALEXIN 500 MG PO CAP
500 mg | ORAL_CAPSULE | Freq: Four times a day (QID) | ORAL | 0 refills | Status: AC
Start: 2019-01-12 — End: ?

## 2019-01-12 NOTE — Telephone Encounter
Pt states that he is changing dressings daily and is having yellow tinged drainage from incision. Dr. Glennon Mac would like to start Keflex 500mg  qid for 7 days

## 2019-01-14 ENCOUNTER — Encounter: Admit: 2019-01-14 | Discharge: 2019-01-14 | Payer: PRIVATE HEALTH INSURANCE

## 2019-01-14 NOTE — Telephone Encounter
Pt sends pictures of post op dressing. He started antibiotics 10-19. Discussed that drainage may increase in the first few days after starting antibiotics. Suggested giving more time to heal and keep our office updated of progress.

## 2019-01-15 ENCOUNTER — Encounter: Admit: 2019-01-15 | Discharge: 2019-01-15 | Payer: PRIVATE HEALTH INSURANCE

## 2019-01-15 ENCOUNTER — Ambulatory Visit: Admit: 2019-01-15 | Discharge: 2019-01-15 | Payer: Commercial Managed Care - HMO

## 2019-01-15 DIAGNOSIS — M48062 Spinal stenosis, lumbar region with neurogenic claudication: Secondary | ICD-10-CM

## 2019-01-15 DIAGNOSIS — IMO0002 Squamous cell carcinoma: Secondary | ICD-10-CM

## 2019-01-15 DIAGNOSIS — N62 Hypertrophy of breast: Secondary | ICD-10-CM

## 2019-01-15 DIAGNOSIS — C4490 Unspecified malignant neoplasm of skin, unspecified: Secondary | ICD-10-CM

## 2019-01-15 DIAGNOSIS — E039 Hypothyroidism, unspecified: Secondary | ICD-10-CM

## 2019-01-15 DIAGNOSIS — K2270 Barrett's esophagus without dysplasia: Secondary | ICD-10-CM

## 2019-01-15 DIAGNOSIS — Z9889 Other specified postprocedural states: Secondary | ICD-10-CM

## 2019-01-15 DIAGNOSIS — K219 Gastro-esophageal reflux disease without esophagitis: Secondary | ICD-10-CM

## 2019-01-15 DIAGNOSIS — E785 Hyperlipidemia, unspecified: Secondary | ICD-10-CM

## 2019-01-15 DIAGNOSIS — M1990 Unspecified osteoarthritis, unspecified site: Secondary | ICD-10-CM

## 2019-01-15 DIAGNOSIS — B999 Unspecified infectious disease: Secondary | ICD-10-CM

## 2019-01-15 DIAGNOSIS — F419 Anxiety disorder, unspecified: Secondary | ICD-10-CM

## 2019-01-15 DIAGNOSIS — E119 Type 2 diabetes mellitus without complications: Secondary | ICD-10-CM

## 2019-01-15 DIAGNOSIS — M549 Dorsalgia, unspecified: Secondary | ICD-10-CM

## 2019-01-15 DIAGNOSIS — N400 Enlarged prostate without lower urinary tract symptoms: Secondary | ICD-10-CM

## 2019-01-15 DIAGNOSIS — I10 Essential (primary) hypertension: Secondary | ICD-10-CM

## 2019-01-15 DIAGNOSIS — C73 Malignant neoplasm of thyroid gland: Secondary | ICD-10-CM

## 2019-01-15 DIAGNOSIS — K5732 Diverticulitis of large intestine without perforation or abscess without bleeding: Secondary | ICD-10-CM

## 2019-01-15 DIAGNOSIS — K3184 Gastroparesis: Secondary | ICD-10-CM

## 2019-01-15 DIAGNOSIS — M545 Low back pain: Secondary | ICD-10-CM

## 2019-01-15 DIAGNOSIS — N200 Calculus of kidney: Secondary | ICD-10-CM

## 2019-01-15 MED ORDER — OXYCODONE 5 MG PO TAB
5-10 mg | ORAL_TABLET | ORAL | 0 refills | 6.00000 days | Status: AC | PRN
Start: 2019-01-15 — End: ?
  Filled 2019-01-15: qty 20, 2d supply, fill #1

## 2019-01-19 ENCOUNTER — Encounter: Admit: 2019-01-19 | Discharge: 2019-01-19 | Payer: PRIVATE HEALTH INSURANCE

## 2019-01-19 ENCOUNTER — Encounter: Admit: 2019-01-19 | Discharge: 2019-01-19 | Payer: Commercial Managed Care - HMO

## 2019-01-19 DIAGNOSIS — E785 Hyperlipidemia, unspecified: Secondary | ICD-10-CM

## 2019-01-19 DIAGNOSIS — N400 Enlarged prostate without lower urinary tract symptoms: Secondary | ICD-10-CM

## 2019-01-19 DIAGNOSIS — M549 Dorsalgia, unspecified: Secondary | ICD-10-CM

## 2019-01-19 DIAGNOSIS — F419 Anxiety disorder, unspecified: Secondary | ICD-10-CM

## 2019-01-19 DIAGNOSIS — T8131XA Disruption of external operation (surgical) wound, not elsewhere classified, initial encounter: Secondary | ICD-10-CM

## 2019-01-19 DIAGNOSIS — K5732 Diverticulitis of large intestine without perforation or abscess without bleeding: Secondary | ICD-10-CM

## 2019-01-19 DIAGNOSIS — E119 Type 2 diabetes mellitus without complications: Secondary | ICD-10-CM

## 2019-01-19 DIAGNOSIS — I10 Essential (primary) hypertension: Secondary | ICD-10-CM

## 2019-01-19 DIAGNOSIS — K219 Gastro-esophageal reflux disease without esophagitis: Secondary | ICD-10-CM

## 2019-01-19 DIAGNOSIS — C73 Malignant neoplasm of thyroid gland: Secondary | ICD-10-CM

## 2019-01-19 DIAGNOSIS — E039 Hypothyroidism, unspecified: Secondary | ICD-10-CM

## 2019-01-19 DIAGNOSIS — IMO0002 Squamous cell carcinoma: Secondary | ICD-10-CM

## 2019-01-19 DIAGNOSIS — M545 Low back pain: Secondary | ICD-10-CM

## 2019-01-19 DIAGNOSIS — K2270 Barrett's esophagus without dysplasia: Secondary | ICD-10-CM

## 2019-01-19 DIAGNOSIS — M1990 Unspecified osteoarthritis, unspecified site: Secondary | ICD-10-CM

## 2019-01-19 DIAGNOSIS — Z9889 Other specified postprocedural states: Secondary | ICD-10-CM

## 2019-01-19 DIAGNOSIS — N62 Hypertrophy of breast: Secondary | ICD-10-CM

## 2019-01-19 DIAGNOSIS — N200 Calculus of kidney: Secondary | ICD-10-CM

## 2019-01-19 DIAGNOSIS — B999 Unspecified infectious disease: Secondary | ICD-10-CM

## 2019-01-19 DIAGNOSIS — C4490 Unspecified malignant neoplasm of skin, unspecified: Secondary | ICD-10-CM

## 2019-01-19 DIAGNOSIS — K3184 Gastroparesis: Secondary | ICD-10-CM

## 2019-01-20 ENCOUNTER — Encounter: Admit: 2019-01-20 | Discharge: 2019-01-20 | Payer: PRIVATE HEALTH INSURANCE

## 2019-01-20 DIAGNOSIS — F419 Anxiety disorder, unspecified: Secondary | ICD-10-CM

## 2019-01-20 DIAGNOSIS — E039 Hypothyroidism, unspecified: Secondary | ICD-10-CM

## 2019-01-20 DIAGNOSIS — K219 Gastro-esophageal reflux disease without esophagitis: Secondary | ICD-10-CM

## 2019-01-20 DIAGNOSIS — K2270 Barrett's esophagus without dysplasia: Secondary | ICD-10-CM

## 2019-01-20 DIAGNOSIS — I10 Essential (primary) hypertension: Secondary | ICD-10-CM

## 2019-01-20 DIAGNOSIS — M549 Dorsalgia, unspecified: Secondary | ICD-10-CM

## 2019-01-20 DIAGNOSIS — IMO0002 Squamous cell carcinoma: Secondary | ICD-10-CM

## 2019-01-20 DIAGNOSIS — N200 Calculus of kidney: Secondary | ICD-10-CM

## 2019-01-20 DIAGNOSIS — Z9889 Other specified postprocedural states: Secondary | ICD-10-CM

## 2019-01-20 DIAGNOSIS — N62 Hypertrophy of breast: Secondary | ICD-10-CM

## 2019-01-20 DIAGNOSIS — C4490 Unspecified malignant neoplasm of skin, unspecified: Secondary | ICD-10-CM

## 2019-01-20 DIAGNOSIS — C73 Malignant neoplasm of thyroid gland: Secondary | ICD-10-CM

## 2019-01-20 DIAGNOSIS — B999 Unspecified infectious disease: Secondary | ICD-10-CM

## 2019-01-20 DIAGNOSIS — K3184 Gastroparesis: Secondary | ICD-10-CM

## 2019-01-20 DIAGNOSIS — N400 Enlarged prostate without lower urinary tract symptoms: Secondary | ICD-10-CM

## 2019-01-20 DIAGNOSIS — M545 Low back pain: Secondary | ICD-10-CM

## 2019-01-20 DIAGNOSIS — E785 Hyperlipidemia, unspecified: Secondary | ICD-10-CM

## 2019-01-20 DIAGNOSIS — K5732 Diverticulitis of large intestine without perforation or abscess without bleeding: Secondary | ICD-10-CM

## 2019-01-20 DIAGNOSIS — M1990 Unspecified osteoarthritis, unspecified site: Secondary | ICD-10-CM

## 2019-01-20 DIAGNOSIS — E119 Type 2 diabetes mellitus without complications: Secondary | ICD-10-CM

## 2019-01-20 MED ORDER — PROMETHAZINE 25 MG/ML IJ SOLN
6.25 mg | INTRAVENOUS | 0 refills | Status: DC | PRN
Start: 2019-01-20 — End: 2019-01-20

## 2019-01-20 MED ORDER — SUCCINYLCHOLINE CHLORIDE 20 MG/ML IJ SOLN
INTRAVENOUS | 0 refills | Status: DC
Start: 2019-01-20 — End: 2019-01-20
  Administered 2019-01-20: 19:00:00 100 mg via INTRAVENOUS

## 2019-01-20 MED ORDER — INSULIN ASPART 100 UNIT/ML SC FLEXPEN
0-12 [IU] | Freq: Before meals | SUBCUTANEOUS | 0 refills | Status: DC
Start: 2019-01-20 — End: 2019-01-21

## 2019-01-20 MED ORDER — FENTANYL CITRATE (PF) 50 MCG/ML IJ SOLN
50 ug | INTRAVENOUS | 0 refills | Status: DC | PRN
Start: 2019-01-20 — End: 2019-01-20
  Administered 2019-01-20: 21:00:00 50 ug via INTRAVENOUS

## 2019-01-20 MED ORDER — LIDOCAINE (PF) 200 MG/10 ML (2 %) IJ SYRG
0 refills | Status: DC
Start: 2019-01-20 — End: 2019-01-20
  Administered 2019-01-20: 19:00:00 100 mg via INTRAVENOUS

## 2019-01-20 MED ORDER — GLIMEPIRIDE 4 MG PO TAB
8 mg | Freq: Two times a day (BID) | ORAL | 0 refills | Status: DC
Start: 2019-01-20 — End: 2019-01-21
  Administered 2019-01-21 (×2): 8 mg via ORAL

## 2019-01-20 MED ORDER — TAMSULOSIN 0.4 MG PO CAP
.4 mg | Freq: Every day | ORAL | 0 refills | Status: DC
Start: 2019-01-20 — End: 2019-01-21
  Administered 2019-01-21: 13:00:00 0.4 mg via ORAL

## 2019-01-20 MED ORDER — VANCOMYCIN 1,000 MG IV SOLR
0 refills | Status: DC
Start: 2019-01-20 — End: 2019-01-20
  Administered 2019-01-20: 20:00:00 1 g via TOPICAL

## 2019-01-20 MED ORDER — POLYETHYLENE GLYCOL 3350 17 GRAM PO PWPK
1 | Freq: Two times a day (BID) | ORAL | 0 refills | Status: DC
Start: 2019-01-20 — End: 2019-01-21

## 2019-01-20 MED ORDER — SODIUM CHLORIDE 0.9 % IV SOLP
0 refills | Status: DC
Start: 2019-01-20 — End: 2019-01-20
  Administered 2019-01-20: 20:00:00 via INTRAVENOUS

## 2019-01-20 MED ORDER — ACETAMINOPHEN 500 MG PO TAB
1000 mg | Freq: Once | ORAL | 0 refills | Status: CP
Start: 2019-01-20 — End: ?
  Administered 2019-01-20: 21:00:00 1000 mg via ORAL

## 2019-01-20 MED ORDER — LEVOTHYROXINE 200 MCG PO TAB
200 ug | Freq: Every day | ORAL | 0 refills | Status: DC
Start: 2019-01-20 — End: 2019-01-21
  Administered 2019-01-21: 11:00:00 200 ug via ORAL

## 2019-01-20 MED ORDER — OXYCODONE 5 MG PO TAB
5-10 mg | Freq: Once | ORAL | 0 refills | Status: CP | PRN
Start: 2019-01-20 — End: ?
  Administered 2019-01-20: 21:00:00 10 mg via ORAL

## 2019-01-20 MED ORDER — METFORMIN 500 MG PO TAB
1000 mg | Freq: Two times a day (BID) | ORAL | 0 refills | Status: DC
Start: 2019-01-20 — End: 2019-01-21
  Administered 2019-01-21: 13:00:00 1000 mg via ORAL

## 2019-01-20 MED ORDER — METOPROLOL TARTRATE 50 MG PO TAB
50 mg | Freq: Two times a day (BID) | ORAL | 0 refills | Status: DC
Start: 2019-01-20 — End: 2019-01-21
  Administered 2019-01-21 (×2): 50 mg via ORAL

## 2019-01-20 MED ORDER — INSULIN LISPRO 100 UNIT/ML SC SOLN
10 [IU] | Freq: Three times a day (TID) | SUBCUTANEOUS | 0 refills | Status: DC
Start: 2019-01-20 — End: 2019-01-20

## 2019-01-20 MED ORDER — DIPHENHYDRAMINE HCL 50 MG/ML IJ SOLN
25 mg | INTRAVENOUS | 0 refills | Status: DC | PRN
Start: 2019-01-20 — End: 2019-01-21

## 2019-01-20 MED ORDER — CEFAZOLIN 1 GRAM IJ SOLR
0 refills | Status: DC
Start: 2019-01-20 — End: 2019-01-20
  Administered 2019-01-20: 19:00:00 2 g via INTRAVENOUS

## 2019-01-20 MED ORDER — PROPOFOL INJ 10 MG/ML IV VIAL
0 refills | Status: DC
Start: 2019-01-20 — End: 2019-01-20
  Administered 2019-01-20: 19:00:00 200 mg via INTRAVENOUS

## 2019-01-20 MED ORDER — INSULIN ASPART 100 UNIT/ML SC FLEXPEN
10 [IU] | Freq: Three times a day (TID) | SUBCUTANEOUS | 0 refills | Status: DC
Start: 2019-01-20 — End: 2019-01-21

## 2019-01-20 MED ORDER — LACTATED RINGERS IV SOLP
1000 mL | INTRAVENOUS | 0 refills | Status: DC
Start: 2019-01-20 — End: 2019-01-20
  Administered 2019-01-20: 19:00:00 1000.000 mL via INTRAVENOUS

## 2019-01-20 MED ORDER — PHENYLEPHRINE IN 0.9% NACL(PF) 1 MG/10 ML (100 MCG/ML) IV SYRG
INTRAVENOUS | 0 refills | Status: DC
Start: 2019-01-20 — End: 2019-01-20
  Administered 2019-01-20 (×7): 100 ug via INTRAVENOUS

## 2019-01-20 MED ORDER — BUPROPION XL 300 MG PO TB24
300 mg | Freq: Every morning | ORAL | 0 refills | Status: DC
Start: 2019-01-20 — End: 2019-01-21
  Administered 2019-01-21: 13:00:00 300 mg via ORAL

## 2019-01-20 MED ORDER — HYDROMORPHONE (PF) 2 MG/ML IJ SYRG
.5-1 mg | INTRAVENOUS | 0 refills | Status: DC | PRN
Start: 2019-01-20 — End: 2019-01-20
  Administered 2019-01-20 (×2): 1 mg via INTRAVENOUS

## 2019-01-20 MED ORDER — EZETIMIBE 10 MG PO TAB
10 mg | Freq: Every day | ORAL | 0 refills | Status: DC
Start: 2019-01-20 — End: 2019-01-21
  Administered 2019-01-21: 13:00:00 10 mg via ORAL

## 2019-01-20 MED ORDER — FENTANYL CITRATE (PF) 50 MCG/ML IJ SOLN
25 ug | INTRAVENOUS | 0 refills | Status: DC | PRN
Start: 2019-01-20 — End: 2019-01-20

## 2019-01-20 MED ORDER — DUTASTERIDE 0.5 MG PO CAP
0.5 mg | Freq: Every day | ORAL | 0 refills | Status: DC
Start: 2019-01-20 — End: 2019-01-21
  Administered 2019-01-21: 13:00:00 0.5 mg via ORAL

## 2019-01-20 MED ORDER — SITAGLIPTIN 100 MG PO TAB
100 mg | Freq: Every day | ORAL | 0 refills | Status: DC
Start: 2019-01-20 — End: 2019-01-21
  Administered 2019-01-21: 13:00:00 100 mg via ORAL

## 2019-01-20 MED ORDER — ONDANSETRON HCL (PF) 4 MG/2 ML IJ SOLN
4 mg | INTRAVENOUS | 0 refills | Status: DC | PRN
Start: 2019-01-20 — End: 2019-01-21
  Administered 2019-01-21: 14:00:00 4 mg via INTRAVENOUS

## 2019-01-20 MED ORDER — FENTANYL CITRATE (PF) 50 MCG/ML IJ SOLN
50 ug | INTRAVENOUS | 0 refills | Status: DC | PRN
Start: 2019-01-20 — End: 2019-01-20

## 2019-01-20 MED ORDER — ACETAMINOPHEN 500 MG PO TAB
1000 mg | ORAL | 0 refills | Status: DC
Start: 2019-01-20 — End: 2019-01-21
  Administered 2019-01-21 (×2): 1000 mg via ORAL

## 2019-01-20 MED ORDER — BISACODYL 10 MG RE SUPP
10 mg | Freq: Every day | RECTAL | 0 refills | Status: DC | PRN
Start: 2019-01-20 — End: 2019-01-21

## 2019-01-20 MED ORDER — INSULIN GLARGINE 100 UNIT/ML (3 ML) SC INJ PEN
30 [IU] | Freq: Every evening | SUBCUTANEOUS | 0 refills | Status: DC
Start: 2019-01-20 — End: 2019-01-21
  Administered 2019-01-21: 07:00:00 30 [IU] via SUBCUTANEOUS

## 2019-01-20 MED ORDER — OXYCODONE 5 MG PO TAB
5-15 mg | ORAL | 0 refills | Status: DC | PRN
Start: 2019-01-20 — End: 2019-01-21
  Administered 2019-01-21 (×5): 15 mg via ORAL

## 2019-01-20 MED ORDER — FENTANYL CITRATE (PF) 50 MCG/ML IJ SOLN
0 refills | Status: DC
Start: 2019-01-20 — End: 2019-01-20
  Administered 2019-01-20: 20:00:00 50 ug via INTRAVENOUS
  Administered 2019-01-20: 19:00:00 150 ug via INTRAVENOUS

## 2019-01-20 MED ORDER — CEFAZOLIN INJ 1GM IVP
2 g | INTRAVENOUS | 0 refills | Status: DC
Start: 2019-01-20 — End: 2019-01-21
  Administered 2019-01-21 (×2): 2 g via INTRAVENOUS

## 2019-01-20 MED ORDER — TRIAMTERENE-HYDROCHLOROTHIAZID 37.5-25 MG PO TAB
.5 | Freq: Every morning | ORAL | 0 refills | Status: DC
Start: 2019-01-20 — End: 2019-01-21
  Administered 2019-01-21: 13:00:00 0.5 via ORAL

## 2019-01-20 MED ORDER — DIPHENHYDRAMINE HCL 25 MG PO CAP
25 mg | ORAL | 0 refills | Status: DC | PRN
Start: 2019-01-20 — End: 2019-01-21

## 2019-01-20 MED ORDER — DOCUSATE SODIUM 100 MG PO CAP
100 mg | Freq: Two times a day (BID) | ORAL | 0 refills | Status: DC
Start: 2019-01-20 — End: 2019-01-21
  Administered 2019-01-21 (×2): 100 mg via ORAL

## 2019-01-20 MED ORDER — OXYCODONE 5 MG PO TAB
5-10 mg | ORAL | 0 refills | Status: DC | PRN
Start: 2019-01-20 — End: 2019-01-21
  Administered 2019-01-21: 10 mg via ORAL

## 2019-01-20 MED ORDER — MAGNESIUM HYDROXIDE 2,400 MG/10 ML PO SUSP
10 mL | Freq: Every day | ORAL | 0 refills | Status: DC
Start: 2019-01-20 — End: 2019-01-21

## 2019-01-20 MED ORDER — GABAPENTIN 300 MG PO CAP
600 mg | Freq: Three times a day (TID) | ORAL | 0 refills | Status: DC
Start: 2019-01-20 — End: 2019-01-21
  Administered 2019-01-21 (×2): 600 mg via ORAL

## 2019-01-20 MED ORDER — MIDAZOLAM 1 MG/ML IJ SOLN
INTRAVENOUS | 0 refills | Status: DC
Start: 2019-01-20 — End: 2019-01-20
  Administered 2019-01-20: 19:00:00 2 mg via INTRAVENOUS

## 2019-01-20 MED ORDER — PANTOPRAZOLE 40 MG PO TBEC
40 mg | Freq: Two times a day (BID) | ORAL | 0 refills | Status: DC
Start: 2019-01-20 — End: 2019-01-21
  Administered 2019-01-21 (×2): 40 mg via ORAL

## 2019-01-20 MED ADMIN — LACTATED RINGERS IV SOLP [4318]: 1000 mL | INTRAVENOUS | @ 18:00:00 | Stop: 2019-01-20 | NDC 00338011704

## 2019-01-21 ENCOUNTER — Encounter: Admit: 2019-01-21 | Discharge: 2019-01-21 | Payer: PRIVATE HEALTH INSURANCE

## 2019-01-21 MED ORDER — ACETAMINOPHEN 500 MG PO TAB
1000 mg | ORAL_TABLET | ORAL | 0 refills | Status: AC
Start: 2019-01-21 — End: ?

## 2019-01-21 MED ORDER — OXYCODONE 5 MG PO TAB
5-10 mg | ORAL_TABLET | ORAL | 0 refills | 6.00000 days | Status: AC | PRN
Start: 2019-01-21 — End: ?

## 2019-01-21 MED ORDER — CEPHALEXIN 250 MG PO CAP
250 mg | ORAL_CAPSULE | Freq: Four times a day (QID) | ORAL | 0 refills | Status: AC
Start: 2019-01-21 — End: ?

## 2019-01-21 MED ORDER — DOCUSATE SODIUM 100 MG PO CAP
100 mg | ORAL_CAPSULE | Freq: Two times a day (BID) | ORAL | 0 refills | Status: AC
Start: 2019-01-21 — End: ?

## 2019-01-22 ENCOUNTER — Encounter: Admit: 2019-01-22 | Discharge: 2019-01-22 | Payer: PRIVATE HEALTH INSURANCE

## 2019-01-22 DIAGNOSIS — K5732 Diverticulitis of large intestine without perforation or abscess without bleeding: Secondary | ICD-10-CM

## 2019-01-22 DIAGNOSIS — M549 Dorsalgia, unspecified: Secondary | ICD-10-CM

## 2019-01-22 DIAGNOSIS — K219 Gastro-esophageal reflux disease without esophagitis: Secondary | ICD-10-CM

## 2019-01-22 DIAGNOSIS — M1990 Unspecified osteoarthritis, unspecified site: Secondary | ICD-10-CM

## 2019-01-22 DIAGNOSIS — N400 Enlarged prostate without lower urinary tract symptoms: Secondary | ICD-10-CM

## 2019-01-22 DIAGNOSIS — C4490 Unspecified malignant neoplasm of skin, unspecified: Secondary | ICD-10-CM

## 2019-01-22 DIAGNOSIS — N200 Calculus of kidney: Secondary | ICD-10-CM

## 2019-01-22 DIAGNOSIS — M545 Low back pain: Secondary | ICD-10-CM

## 2019-01-22 DIAGNOSIS — E119 Type 2 diabetes mellitus without complications: Secondary | ICD-10-CM

## 2019-01-22 DIAGNOSIS — Z9889 Other specified postprocedural states: Secondary | ICD-10-CM

## 2019-01-22 DIAGNOSIS — K3184 Gastroparesis: Secondary | ICD-10-CM

## 2019-01-22 DIAGNOSIS — E039 Hypothyroidism, unspecified: Secondary | ICD-10-CM

## 2019-01-22 DIAGNOSIS — IMO0002 Squamous cell carcinoma: Secondary | ICD-10-CM

## 2019-01-22 DIAGNOSIS — N62 Hypertrophy of breast: Secondary | ICD-10-CM

## 2019-01-22 DIAGNOSIS — C73 Malignant neoplasm of thyroid gland: Secondary | ICD-10-CM

## 2019-01-22 DIAGNOSIS — I10 Essential (primary) hypertension: Secondary | ICD-10-CM

## 2019-01-22 DIAGNOSIS — F419 Anxiety disorder, unspecified: Secondary | ICD-10-CM

## 2019-01-22 DIAGNOSIS — E785 Hyperlipidemia, unspecified: Secondary | ICD-10-CM

## 2019-01-22 DIAGNOSIS — K2270 Barrett's esophagus without dysplasia: Secondary | ICD-10-CM

## 2019-01-22 DIAGNOSIS — B999 Unspecified infectious disease: Secondary | ICD-10-CM

## 2019-01-26 ENCOUNTER — Encounter: Admit: 2019-01-26 | Discharge: 2019-01-26 | Payer: PRIVATE HEALTH INSURANCE

## 2019-01-26 MED ORDER — HYDROCODONE-ACETAMINOPHEN 7.5-325 MG PO TAB
1-2 | ORAL_TABLET | ORAL | 0 refills | 15.00000 days | Status: AC | PRN
Start: 2019-01-26 — End: ?

## 2019-02-12 ENCOUNTER — Encounter: Admit: 2019-02-12 | Discharge: 2019-02-12 | Payer: PRIVATE HEALTH INSURANCE

## 2019-02-12 ENCOUNTER — Ambulatory Visit: Admit: 2019-02-12 | Discharge: 2019-02-12 | Payer: Commercial Managed Care - HMO

## 2019-02-12 DIAGNOSIS — N400 Enlarged prostate without lower urinary tract symptoms: Secondary | ICD-10-CM

## 2019-02-12 DIAGNOSIS — I10 Essential (primary) hypertension: Secondary | ICD-10-CM

## 2019-02-12 DIAGNOSIS — Z9889 Other specified postprocedural states: Secondary | ICD-10-CM

## 2019-02-12 DIAGNOSIS — E039 Hypothyroidism, unspecified: Secondary | ICD-10-CM

## 2019-02-12 DIAGNOSIS — C4490 Unspecified malignant neoplasm of skin, unspecified: Secondary | ICD-10-CM

## 2019-02-12 DIAGNOSIS — E119 Type 2 diabetes mellitus without complications: Secondary | ICD-10-CM

## 2019-02-12 DIAGNOSIS — M545 Low back pain: Secondary | ICD-10-CM

## 2019-02-12 DIAGNOSIS — F419 Anxiety disorder, unspecified: Secondary | ICD-10-CM

## 2019-02-12 DIAGNOSIS — K219 Gastro-esophageal reflux disease without esophagitis: Secondary | ICD-10-CM

## 2019-02-12 DIAGNOSIS — N200 Calculus of kidney: Secondary | ICD-10-CM

## 2019-02-12 DIAGNOSIS — K2270 Barrett's esophagus without dysplasia: Secondary | ICD-10-CM

## 2019-02-12 DIAGNOSIS — B999 Unspecified infectious disease: Secondary | ICD-10-CM

## 2019-02-12 DIAGNOSIS — IMO0002 Squamous cell carcinoma: Secondary | ICD-10-CM

## 2019-02-12 DIAGNOSIS — M48062 Spinal stenosis, lumbar region with neurogenic claudication: Secondary | ICD-10-CM

## 2019-02-12 DIAGNOSIS — M549 Dorsalgia, unspecified: Secondary | ICD-10-CM

## 2019-02-12 DIAGNOSIS — E785 Hyperlipidemia, unspecified: Secondary | ICD-10-CM

## 2019-02-12 DIAGNOSIS — C73 Malignant neoplasm of thyroid gland: Secondary | ICD-10-CM

## 2019-02-12 DIAGNOSIS — K5732 Diverticulitis of large intestine without perforation or abscess without bleeding: Secondary | ICD-10-CM

## 2019-02-12 DIAGNOSIS — M1990 Unspecified osteoarthritis, unspecified site: Secondary | ICD-10-CM

## 2019-02-12 DIAGNOSIS — K3184 Gastroparesis: Secondary | ICD-10-CM

## 2019-02-12 DIAGNOSIS — N62 Hypertrophy of breast: Secondary | ICD-10-CM

## 2019-02-12 NOTE — Progress Notes
[facility] CLINIC NOTE      Dictation on: 02/12/2019 11:18 AM by: Sue Lush [RSTUCKER]              Review of Systems   Constitutional: Negative.    HENT: Positive for sneezing.    Eyes: Negative.    Respiratory: Negative.    Cardiovascular: Negative.    Gastrointestinal: Positive for nausea.   Endocrine: Positive for polyphagia.   Genitourinary: Positive for frequency.   Musculoskeletal: Positive for arthralgias and back pain.   Skin: Negative.    Allergic/Immunologic: Negative.    Neurological: Positive for light-headedness.   Hematological: Negative.    Psychiatric/Behavioral: Negative.        Current Outpatient Medications:   ?  acetaminophen (TYLENOL) 500 mg tablet, Take two tablets by mouth every 6 hours. Max of 4,000 mg of acetaminophen in 24 hours., Disp: 180 tablet, Rfl: 0  ?  ascorbic acid 1,000 mg tablet, Take 1 tablet by mouth daily., Disp: , Rfl:   ?  aspirin 81 mg chewable tablet, Chew 81 mg by mouth daily. Take with food., Disp: , Rfl:   ?  atorvastatin (LIPITOR) 20 mg tablet, Take 20 mg by mouth daily., Disp: , Rfl:   ?  blood sugar diagnostic test strip, Use 1 strip as directed before meals and at bedtime., Disp: , Rfl:   ?  buPROPion XL (WELLBUTRIN XL) 300 mg tablet, Take 300 mg by mouth every morning. Do not crush or chew., Disp: , Rfl:   ?  cholecalciferol (Vitamin D3) (VITAMIN D-3) 1,000 units tablet, Take 1,000 Units by mouth twice daily.  , Disp: , Rfl:   ?  cyanocobalamin (VITAMIN B-12) 500 mcg tablet, Take 500 mcg by mouth daily., Disp: , Rfl:   ?  docusate (COLACE) 100 mg capsule, Take one capsule by mouth twice daily., Disp: 40 capsule, Rfl: 0  ?  dutasteride (AVODART) 0.5 mg capsule, Take 1 Cap by mouth daily., Disp: 90 Cap, Rfl: 3  ?  Fluocinolone 0.01 % oil, Apply  topically to affected area., Disp: , Rfl:   ?  gabapentin (NEURONTIN) 600 mg tablet, Take 600 mg by mouth three times daily., Disp: , Rfl: ?  glimepiride (AMARYL) 4 mg tablet, Take 8 mg by mouth twice daily., Disp: , Rfl:   ?  HYDROcodone/acetaminophen (NORCO) 7.5/325 mg tablet, Take one tablet to two tablets by mouth every 4 hours as needed for Pain for up to 17 days  Max 8 tabs/day. Continue to wean down off of this medication. This should last until your next appt on 11/19, Disp: 100 tablet, Rfl: 0  ?  hydrocortisone 1 % topical cream, Apply  topically to affected area twice daily., Disp: , Rfl:   ?  insulin glargine (LANTUS SOLOSTAR U-100 INSULIN) 100 unit/mL (3 mL) injection PEN, Inject 40 Units under the skin at bedtime daily., Disp: , Rfl:   ?  insulin lispro (HUMALOG KWIKPEN INSULIN) 100 unit/mL injection PEN, Inject 15 Units under the skin three times daily with meals., Disp: , Rfl:   ?  JARDIANCE 25 mg tablet, Take 1 tablet by mouth daily., Disp: , Rfl:   ?  levothyroxine (SYNTHROID) 200 mcg tablet, Take 1 Tab by mouth daily., Disp: 90 Tab, Rfl: 3  ?  metFORMIN (GLUCOPHAGE) 1,000 mg tablet, Take 1 Tab by mouth twice daily., Disp: 180 Tab, Rfl: 3  ?  metoprolol tartrate (LOPRESSOR) 50 mg tablet, Take 50 mg by mouth twice daily., Disp: , Rfl:   ?  omeprazole DR(+) (PRILOSEC) 40 mg capsule, Take 40 mg by mouth twice daily., Disp: , Rfl:   ?  ondansetron (ZOFRAN ODT) 8 mg rapid dissolve tablet, Dissolve 1 tablet by mouth as Needed., Disp: , Rfl:   ?  oxyCODONE (ROXICODONE) 5 mg tablet, Take one tablet to two tablets by mouth every 3 hours as needed for Pain, Disp: 75 tablet, Rfl: 0  ?  senna/docusate (SENOKOT-S) 8.6/50 mg tablet, Take one tablet by mouth daily. (Patient taking differently: Take 2 tablets by mouth twice daily.), Disp: 20 tablet, Rfl: 0  ?  SITagliptin (JANUVIA) 100 mg tab tablet, Take 100 mg by mouth daily., Disp: , Rfl:   ?  tamsulosin (FLOMAX) 0.4 mg capsule, Take 0.4 mg by mouth daily. Do not crush, chew or open capsules. Take 30 minutes following the same meal each day., Disp: , Rfl: ?  triamterene-hydrochlorothiazide (MAXZIDE) 37.5-25 mg tablet, Take one-half tablet by mouth every morning., Disp: 90 tablet, Rfl: 3  ?  vitamins, multiple (ONE-A-DAY MENS) tablet, Take 1 Tab by mouth daily.  , Disp: , Rfl:   ?  ZETIA 10 mg tablet, TAKE ONE TABLET BY MOUTH ONCE DAILY, Disp: 90 Tab, Rfl: 1  Allergies   Allergen Reactions   ? Ambien [Zolpidem] HALLUCINATIONS   ? Statins-Hmg-Coa Reductase Inhibitors MUSCLE PAIN     Physical Exam  Vitals:    02/12/19 1105   BP: (!) 142/84   BP Source: Arm, Right Upper   Patient Position: Sitting   Pulse: 89   Resp: 18   Temp: 36.8 ?C (98.3 ?F)   SpO2: 98%   Weight: 112.5 kg (248 lb)   Height: 185.4 cm (72.99)   PainSc: Six     Oswestry Total Score:: 32  Pain Score: Six  Body mass index is 32.73 kg/m?Marland Kitchen

## 2019-02-26 ENCOUNTER — Encounter: Admit: 2019-02-26 | Discharge: 2019-02-26 | Payer: PRIVATE HEALTH INSURANCE

## 2019-03-26 ENCOUNTER — Encounter: Admit: 2019-03-26 | Discharge: 2019-03-26 | Payer: PRIVATE HEALTH INSURANCE

## 2019-03-26 DIAGNOSIS — Z9889 Other specified postprocedural states: Secondary | ICD-10-CM

## 2019-03-26 DIAGNOSIS — M48062 Spinal stenosis, lumbar region with neurogenic claudication: Secondary | ICD-10-CM

## 2019-03-26 NOTE — Telephone Encounter
Patient cancelled appt due to weather, but still wants to begin therapy if appropriate. Order faxed to [facility]

## 2019-04-16 ENCOUNTER — Encounter: Admit: 2019-04-16 | Discharge: 2019-04-16 | Payer: PRIVATE HEALTH INSURANCE

## 2019-07-21 MED ORDER — DULOXETINE 30 MG PO CPDR
30 mg | ORAL_CAPSULE | Freq: Every day | ORAL | 5 refills | 60.00000 days | Status: AC
Start: 2019-07-21 — End: ?

## 2019-09-17 ENCOUNTER — Encounter: Admit: 2019-09-17 | Discharge: 2019-09-17 | Payer: MEDICARE

## 2019-10-12 ENCOUNTER — Encounter: Admit: 2019-10-12 | Discharge: 2019-10-12 | Payer: MEDICARE

## 2019-10-12 NOTE — Telephone Encounter
Pt's wife calling regarding procedure done with Dr. Nedra Hai in [place], two weeks ago.

## 2020-02-23 ENCOUNTER — Encounter: Admit: 2020-02-23 | Discharge: 2020-02-23 | Payer: MEDICARE

## 2020-03-03 ENCOUNTER — Encounter: Admit: 2020-03-03 | Discharge: 2020-03-03 | Payer: MEDICARE

## 2020-03-03 ENCOUNTER — Ambulatory Visit: Admit: 2020-03-03 | Discharge: 2020-03-03 | Payer: MEDICARE

## 2020-03-03 DIAGNOSIS — Z9889 Other specified postprocedural states: Secondary | ICD-10-CM

## 2020-03-03 DIAGNOSIS — E785 Hyperlipidemia, unspecified: Secondary | ICD-10-CM

## 2020-03-03 DIAGNOSIS — C73 Malignant neoplasm of thyroid gland: Secondary | ICD-10-CM

## 2020-03-03 DIAGNOSIS — F419 Anxiety disorder, unspecified: Secondary | ICD-10-CM

## 2020-03-03 DIAGNOSIS — G5622 Lesion of ulnar nerve, left upper limb: Secondary | ICD-10-CM

## 2020-03-03 DIAGNOSIS — N62 Hypertrophy of breast: Secondary | ICD-10-CM

## 2020-03-03 DIAGNOSIS — I10 Essential (primary) hypertension: Secondary | ICD-10-CM

## 2020-03-03 DIAGNOSIS — M549 Dorsalgia, unspecified: Secondary | ICD-10-CM

## 2020-03-03 DIAGNOSIS — N400 Enlarged prostate without lower urinary tract symptoms: Secondary | ICD-10-CM

## 2020-03-03 DIAGNOSIS — M545 Chronic lower back pain: Secondary | ICD-10-CM

## 2020-03-03 DIAGNOSIS — K2270 Barrett's esophagus without dysplasia: Secondary | ICD-10-CM

## 2020-03-03 DIAGNOSIS — K5732 Diverticulitis of large intestine without perforation or abscess without bleeding: Secondary | ICD-10-CM

## 2020-03-03 DIAGNOSIS — K3184 Gastroparesis: Secondary | ICD-10-CM

## 2020-03-03 DIAGNOSIS — N200 Calculus of kidney: Secondary | ICD-10-CM

## 2020-03-03 DIAGNOSIS — C4490 Unspecified malignant neoplasm of skin, unspecified: Secondary | ICD-10-CM

## 2020-03-03 DIAGNOSIS — B999 Unspecified infectious disease: Secondary | ICD-10-CM

## 2020-03-03 DIAGNOSIS — W19XXXD Unspecified fall, subsequent encounter: Secondary | ICD-10-CM

## 2020-03-03 DIAGNOSIS — IMO0002 Squamous cell carcinoma: Secondary | ICD-10-CM

## 2020-03-03 DIAGNOSIS — E039 Hypothyroidism, unspecified: Secondary | ICD-10-CM

## 2020-03-03 DIAGNOSIS — M1990 Unspecified osteoarthritis, unspecified site: Secondary | ICD-10-CM

## 2020-03-03 DIAGNOSIS — E119 Type 2 diabetes mellitus without complications: Secondary | ICD-10-CM

## 2020-03-03 DIAGNOSIS — K219 Gastro-esophageal reflux disease without esophagitis: Secondary | ICD-10-CM

## 2020-03-03 NOTE — Procedures
Please refer to scanned EMG/NCS report for study results.  No separate note otherwise placed for procedure.      Patient well tolerated the procedure and was sent home with no complications.  Time out was taken today to confirm patient identity.

## 2020-03-08 NOTE — Patient Instructions
Dr. Southwell and his staff appreciate the opportunity to care for you today. A few items to assist you with what comes next:     *If lab work or imaging was ordered, I will be in contact with you only if the results are abnormal enough that Dr. Southwell requests for your to be notified. If needed, adjustments to the plan will be made.  You are very welcome to contact me to discuss results sooner if you'd prefer.     *All testing and lab work be done here at [facility] unless insurance requires an alternative.  If any part of your care such as a referral doesn't seem to be getting scheduled within a week, please contact our office so we can assist you.     *Dr. Southwell has asked that you be scheduled for a follow up appointment in a specific amount of time to go over all results with him.  If all testing has not been completed by your scheduled follow up appointment, we will need to reschedule this appointment.  At your follow up appointment he will explain results, continue neurological treatment or discharge you back to your referring physician.      Feel free to call or message me through my chart.  I'm here to assist you between appointments and can discuss concerns with Dr. Southwell as needed.      Changes in appointment and wait list requests can be made by calling our scheduling line at [phone].     Our goal is to provide you with the very best patient care. If we have not met this expectation, please call me to discuss as we appreciate opportunities for improvement.  We are very glad to have met you today and hope you were pleased with the care you received.     Information to contact nursing staff for Dr. Southwell  [phone]  [phone] fax  [phone] scheduling  Send message by mychart

## 2020-03-09 ENCOUNTER — Encounter: Admit: 2020-03-09 | Discharge: 2020-03-09 | Payer: MEDICARE

## 2020-03-09 ENCOUNTER — Ambulatory Visit: Admit: 2020-03-09 | Discharge: 2020-03-09 | Payer: MEDICARE

## 2020-03-09 DIAGNOSIS — M545 Chronic lower back pain: Secondary | ICD-10-CM

## 2020-03-09 DIAGNOSIS — C4490 Unspecified malignant neoplasm of skin, unspecified: Secondary | ICD-10-CM

## 2020-03-09 DIAGNOSIS — I10 Essential (primary) hypertension: Secondary | ICD-10-CM

## 2020-03-09 DIAGNOSIS — E785 Hyperlipidemia, unspecified: Secondary | ICD-10-CM

## 2020-03-09 DIAGNOSIS — M1990 Unspecified osteoarthritis, unspecified site: Secondary | ICD-10-CM

## 2020-03-09 DIAGNOSIS — K219 Gastro-esophageal reflux disease without esophagitis: Secondary | ICD-10-CM

## 2020-03-09 DIAGNOSIS — K5732 Diverticulitis of large intestine without perforation or abscess without bleeding: Secondary | ICD-10-CM

## 2020-03-09 DIAGNOSIS — K2270 Barrett's esophagus without dysplasia: Secondary | ICD-10-CM

## 2020-03-09 DIAGNOSIS — K3184 Gastroparesis: Secondary | ICD-10-CM

## 2020-03-09 DIAGNOSIS — E119 Type 2 diabetes mellitus without complications: Secondary | ICD-10-CM

## 2020-03-09 DIAGNOSIS — Z9889 Other specified postprocedural states: Secondary | ICD-10-CM

## 2020-03-09 DIAGNOSIS — E039 Hypothyroidism, unspecified: Secondary | ICD-10-CM

## 2020-03-09 DIAGNOSIS — IMO0002 Squamous cell carcinoma: Secondary | ICD-10-CM

## 2020-03-09 DIAGNOSIS — F419 Anxiety disorder, unspecified: Secondary | ICD-10-CM

## 2020-03-09 DIAGNOSIS — C73 Malignant neoplasm of thyroid gland: Secondary | ICD-10-CM

## 2020-03-09 DIAGNOSIS — M549 Dorsalgia, unspecified: Secondary | ICD-10-CM

## 2020-03-09 DIAGNOSIS — N200 Calculus of kidney: Secondary | ICD-10-CM

## 2020-03-09 DIAGNOSIS — N62 Hypertrophy of breast: Secondary | ICD-10-CM

## 2020-03-09 DIAGNOSIS — R270 Ataxia, unspecified: Secondary | ICD-10-CM

## 2020-03-09 DIAGNOSIS — N400 Enlarged prostate without lower urinary tract symptoms: Secondary | ICD-10-CM

## 2020-03-09 DIAGNOSIS — B999 Unspecified infectious disease: Secondary | ICD-10-CM

## 2020-03-09 MED ORDER — DULOXETINE 60 MG PO CPDR
60 mg | ORAL_CAPSULE | Freq: Every day | ORAL | 2 refills | 60.00000 days | Status: AC
Start: 2020-03-09 — End: ?

## 2020-03-09 NOTE — Progress Notes
Date of Service: 03/09/2020     Subjective:               Jimmy Waters is a 64 y.o. male.        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  Possible very mild left median entrapment at the wrist.  No evidence of large fiber neuropathy.  Small fiber neuropathy not excluded due to insufficient study sensitivity.    Last clinical visit April 2021.  He presented with numbness in the hands but none in the feet.  I was concerned for diabetic polyneuropathy given ataxia and fall.    I changed gabapentin to 1200 mg twice daily.  I added Cymbalta 30 mg daily.  I referred to physical therapy and orthotics for a four-point cane.    Labs CBC benign, B12 2786, ESR 15, metabolic panel showed glucose of 161 A1c 8.1, TSH 0.3 T4 1.0 MMA and B6 normal, S IFE no paraprotein, ANA and RF negative, syphilis and HIV not detected    He describes balance as horrible.  He feels lightheaded and dizziness.  He has had near fall when entering into the house.  He stepped with the wrong foot first and it gave out.  He slid down to the floor.      He has been doing PT/OT thru home health.    Numbness in the hands is unchanged.  He feels decreased dexterity of the hands.  There has been tingling wo burning.  It is symmetric.      He denies numbness/tingling of the feet.  He has had no cramps.      There have been no new concerns since last time.  He has had right hip surgery (09/2019) and had leg jerks after surgery.  He also has had right thigh pain. It involves the right anterolateral thigh.  He describes it as a jabbing, continuous jab.      Cymbalta has not helped tingling.  No side effects reported.           Review of Systems   Constitutional: Negative.    HENT: Negative.    Eyes: Negative.    Respiratory: Negative.    Cardiovascular: Negative.    Gastrointestinal: Negative.    Endocrine: Negative.    Genitourinary: Negative.    Musculoskeletal: Negative. Skin: Negative.    Allergic/Immunologic: Negative.    Neurological: Negative.    Hematological: Negative.    Psychiatric/Behavioral: Negative.    All other systems reviewed and are negative.      Chief Complaint:  Chief Complaint   Patient presents with   ? Follow Up     neuropathy       Past Medical History:  Medical History:   Diagnosis Date   ? Anxiety disorder    ? Back pain    ? Barrett esophagus    ? BPH (benign prostatic hyperplasia)    ? Cataract    ? Chronic lower back pain    ? Degenerative arthritis    ? Depression (disease)    ? Diverticulitis of colon (without mention of hemorrhage)(562.11)     type 2   ? DM type 2 (diabetes mellitus, type 2) (HCC)    ? Dyslipidemia    ? Gastroparesis    ? GERD (gastroesophageal reflux disease)    ? Gynecomastia    ? History of Nissen fundoplication ~1990   ?  HLD (hyperlipidemia)    ? HTN (hypertension)    ? Hx of arthroscopy of knee    ? Hypothyroidism    ? Infection    ? Kidney stones    ? Osteoarthritis    ? Skin cancer    ? Squamous cell carcinoma    ? Thyroid cancer [facility]) 2009       Surgical History:  Surgical History:   Procedure Laterality Date   ? BACK SURGERY  1982   ? CYSTOURETHROSCOPY  2012   ? KIDNEY STONE SURGERY  2012   ? COLONOSCOPY  02/14/11   ? HX APPENDECTOMY  12/12   ? REPEAT LEFT LUMBAR 5 TO SACRAL 1 LAMINOTOMY N/A 01/02/2019    Performed by Karie Chimera, MD at [facility] OR   ? INCISION AND DRAINAGE POSTOPERATIVE WOUND INFECTION - COMPLEX N/A 01/20/2019    Performed by Karie Chimera, MD at [facility] OR   ? CATARACT REMOVAL     ? CYSTOSCOPY     ? HX HIP SURGERY Bilateral    ? HX KNEE ARTHROSCOPY     ? HX KNEE SURGERY Bilateral    ? HX LAPAROTOMY      possible colon fistula   ? HX LYMPHADENECTOMY     ? HX ROTATOR CUFF REPAIR Bilateral     [id number]   ? HX SHOULDER REPLACEMENT Bilateral    ? HX THYROIDECTOMY     ? HX VASECTOMY     ? THYROIDECTOMY     ? UPPER GASTROINTESTINAL ENDOSCOPY         Social History:   Social History     Socioeconomic History   ? Marital status: Married     Spouse name: Not on file   ? Number of children: Not on file   ? Years of education: Not on file   ? Highest education level: Not on file   Occupational History   ? Not on file   Tobacco Use   ? Smoking status: Never Smoker   ? Smokeless tobacco: Never Used   Vaping Use   ? Vaping Use: Never used   Substance and Sexual Activity   ? Alcohol use: Yes     Comment: Occasional   ? Drug use: No   ? Sexual activity: Yes     Partners: Female   Other Topics Concern   ? Not on file   Social History Narrative   ? Not on file             Family History:  Family History   Problem Relation Age of Onset   ? Cancer-Lung Mother    ? Melanoma Mother    ? Cancer Mother    ? Diabetes Mother    ? Heart Disease Mother    ? Diabetes Father    ? Hypertension Sister    ? Heart Disease Sister    ? Cancer Sister    ? Diabetes Sister    ? Arthritis Sister    ? Hypertension Brother    ? Diabetes Brother    ? Arthritis Brother        Allergies:  Allergies   Allergen Reactions   ? Ambien [Zolpidem] HALLUCINATIONS   ? Statins-Hmg-Coa Reductase Inhibitors MUSCLE PAIN       Objective:         ? acetaminophen (TYLENOL) 500 mg tablet Take two tablets by mouth every 6 hours. Max of 4,000 mg of acetaminophen in 24 hours.   ?  ascorbic acid 1,000 mg tablet Take 1 tablet by mouth daily.   ? aspirin 81 mg chewable tablet Chew 81 mg by mouth daily. Take with food.   ? atorvastatin (LIPITOR) 20 mg tablet Take 20 mg by mouth daily.   ? blood sugar diagnostic test strip Use 1 strip as directed before meals and at bedtime.   ? buPROPion XL (WELLBUTRIN XL) 300 mg tablet Take 300 mg by mouth every morning. Do not crush or chew.   ? cholecalciferol (Vitamin D3) (VITAMIN D-3) 1,000 units tablet Take 1,000 Units by mouth twice daily.     ? cyanocobalamin (VITAMIN B-12) 500 mcg tablet Take 500 mcg by mouth daily.   ? docusate (COLACE) 100 mg capsule Take one capsule by mouth twice daily. (Patient taking differently: Take 100 mg by mouth as Needed.)   ? duloxetine DR (CYMBALTA) 30 mg capsule Take one capsule by mouth daily.   ? dutasteride (AVODART) 0.5 mg capsule Take 1 Cap by mouth daily.   ? Fluocinolone 0.01 % oil Apply  topically to affected area.   ? gabapentin (NEURONTIN) 600 mg tablet Take 1,200 mg by mouth twice daily.   ? glimepiride (AMARYL) 4 mg tablet Take 8 mg by mouth twice daily.   ? hydrocortisone 1 % topical cream Apply  topically to affected area twice daily.   ? insulin glargine (LANTUS SOLOSTAR U-100 INSULIN) 100 unit/mL (3 mL) injection PEN Inject 40 Units under the skin at bedtime daily.   ? insulin lispro (HUMALOG KWIKPEN INSULIN) 100 unit/mL injection PEN Inject 15 Units under the skin three times daily with meals.   ? JARDIANCE 25 mg tablet Take 1 tablet by mouth daily.   ? levothyroxine (SYNTHROID) 200 mcg tablet Take 1 Tab by mouth daily.   ? metFORMIN (GLUCOPHAGE) 1,000 mg tablet Take 1 Tab by mouth twice daily.   ? metoprolol tartrate (LOPRESSOR) 50 mg tablet Take 50 mg by mouth twice daily.   ? omeprazole DR(+) (PRILOSEC) 40 mg capsule Take 40 mg by mouth twice daily.   ? ondansetron (ZOFRAN ODT) 8 mg rapid dissolve tablet Dissolve 1 tablet by mouth as Needed.   ? oxyCODONE (ROXICODONE) 5 mg tablet Take one tablet to two tablets by mouth every 3 hours as needed for Pain   ? senna/docusate (SENOKOT-S) 8.6/50 mg tablet Take one tablet by mouth daily. (Patient taking differently: Take 2 tablets by mouth twice daily.)   ? SITagliptin (JANUVIA) 100 mg tab tablet Take 100 mg by mouth daily.   ? tamsulosin (FLOMAX) 0.4 mg capsule Take 0.4 mg by mouth daily. Do not crush, chew or open capsules. Take 30 minutes following the same meal each day.   ? triamterene-hydrochlorothiazide (MAXZIDE) 37.5-25 mg tablet Take one-half tablet by mouth every morning.   ? vitamins, multiple (ONE-A-DAY MENS) tablet Take 1 Tab by mouth daily.     ? ZETIA 10 mg tablet TAKE ONE TABLET BY MOUTH ONCE DAILY     Vitals:    03/09/20 1221   BP: (!) 152/95 BP Source: Arm, Left Upper   Patient Position: Sitting   Pulse: 87   Resp: 18   Temp: 36.5 ?C (97.7 ?F)   TempSrc: Oral   SpO2: 98%   Weight: 113.4 kg (250 lb)   Height: 185.4 cm (73)   PainSc: Zero     Body mass index is 32.98 kg/m?Marland Kitchen       Physical Exam    General: WG/WN, obese, pleasant demeanor, cooperative, follows commands  HEENT: NC/AT  Cardiac: RRR without murmur  Speech: fluent, no dysarthria or aphasia  Mental status: Alert, oriented x 4, NAD  ?  CN: PERRL, EOMI without restriction or nystagmus, facial sensation symmetric (V1-V3) to LT bilaterally, No facial droop or ptosis, tongue midline  ?  Strength:   UEs:      5/5 SA/EF/EE/WE/WF/TA/TO, FA 5/4  LEs:      5/5 HF/KE/DF/PF  No involuntary movements, no tremor  ?  Sensation: PP without gradient in LEs, decreased in patch of right anterolateral thigh   ?  DTRs: 2/2 in BR/B/P 0/0A, downgoing plantar reflexes bilaterally  ?  Gait: antalgic, waddle, wide base, uses cane  ?       Assessment and Plan:    1.  Poorly controlled diabetes mellitus  2.  Disturbance of gait with ataxia  3.  Mild left cubital tunnel syndrome  4.  ? left carpal tunnel syndrome  5. Right meralgia paresthetic post hip surgery    Plan:  1. MRI head w/o GAD  2. Increase Cymbalta to 60 mg daily  3. Continue Gabapentin 1200 mg BID  4. Continue PT/OT  5. Fall prevention  6. Consider ortho referral if UE worsen.  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 Better diabetic control advised.  Patient has cheated on diet recently  8. RTC 6 months.  Prn sooner.  Weight loss discussed due to right meralgia paresthetica (lessen compression on nerve).

## 2020-04-08 ENCOUNTER — Encounter: Admit: 2020-04-08 | Discharge: 2020-04-08 | Payer: MEDICARE

## 2020-04-08 ENCOUNTER — Ambulatory Visit: Admit: 2020-04-08 | Discharge: 2020-04-08 | Payer: MEDICARE

## 2020-04-08 DIAGNOSIS — R270 Ataxia, unspecified: Secondary | ICD-10-CM

## 2020-09-07 ENCOUNTER — Encounter: Admit: 2020-09-07 | Discharge: 2020-09-07 | Payer: MEDICARE

## 2020-09-07 ENCOUNTER — Ambulatory Visit: Admit: 2020-09-07 | Discharge: 2020-09-07 | Payer: MEDICARE

## 2020-09-07 DIAGNOSIS — E119 Type 2 diabetes mellitus without complications: Secondary | ICD-10-CM

## 2020-09-07 DIAGNOSIS — B999 Unspecified infectious disease: Secondary | ICD-10-CM

## 2020-09-07 DIAGNOSIS — N400 Enlarged prostate without lower urinary tract symptoms: Secondary | ICD-10-CM

## 2020-09-07 DIAGNOSIS — IMO0002 Squamous cell carcinoma: Secondary | ICD-10-CM

## 2020-09-07 DIAGNOSIS — C4490 Unspecified malignant neoplasm of skin, unspecified: Secondary | ICD-10-CM

## 2020-09-07 DIAGNOSIS — R339 Retention of urine, unspecified: Secondary | ICD-10-CM

## 2020-09-07 DIAGNOSIS — C73 Malignant neoplasm of thyroid gland: Secondary | ICD-10-CM

## 2020-09-07 DIAGNOSIS — F419 Anxiety disorder, unspecified: Secondary | ICD-10-CM

## 2020-09-07 DIAGNOSIS — R270 Ataxia, unspecified: Secondary | ICD-10-CM

## 2020-09-07 DIAGNOSIS — K3184 Gastroparesis: Secondary | ICD-10-CM

## 2020-09-07 DIAGNOSIS — N62 Hypertrophy of breast: Secondary | ICD-10-CM

## 2020-09-07 DIAGNOSIS — N200 Calculus of kidney: Secondary | ICD-10-CM

## 2020-09-07 DIAGNOSIS — M549 Dorsalgia, unspecified: Secondary | ICD-10-CM

## 2020-09-07 DIAGNOSIS — K2270 Barrett's esophagus without dysplasia: Secondary | ICD-10-CM

## 2020-09-07 DIAGNOSIS — Z9889 Other specified postprocedural states: Secondary | ICD-10-CM

## 2020-09-07 DIAGNOSIS — M1990 Unspecified osteoarthritis, unspecified site: Secondary | ICD-10-CM

## 2020-09-07 DIAGNOSIS — K219 Gastro-esophageal reflux disease without esophagitis: Secondary | ICD-10-CM

## 2020-09-07 DIAGNOSIS — E785 Hyperlipidemia, unspecified: Secondary | ICD-10-CM

## 2020-09-07 DIAGNOSIS — I10 Essential (primary) hypertension: Secondary | ICD-10-CM

## 2020-09-07 DIAGNOSIS — M545 Chronic lower back pain: Secondary | ICD-10-CM

## 2020-09-07 DIAGNOSIS — K5732 Diverticulitis of large intestine without perforation or abscess without bleeding: Secondary | ICD-10-CM

## 2020-09-07 DIAGNOSIS — E039 Hypothyroidism, unspecified: Secondary | ICD-10-CM

## 2020-09-07 NOTE — Progress Notes
Date of Service: 09/07/2020     Subjective:               Jimmy Waters is a 65 y.o. male.        History of Present Illness    Our last visit was in December 2021 for disturbance of gait with ataxia.    I ordered MRI of the head done in January 2022 that showed no acute pathology and mild generalized volume loss and white matter change.    He is a poorly controlled diabetic.  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  No evidence of large fiber neuropathy.  Small fiber neuropathy not excluded.    I increased Cymbalta to 60 mg daily and gabapentin continued 1200 twice daily.  We continued physical and occupational therapy.    He thinks balance is worse.  He describes a flash that comes in front of my eyes and I feel off balance.  He does not feel presyncopal and has not lost consciousness.  It is not necessarily associated with positional change.  He denies vertigo.    Uses a cane for support.  He had 1 fall since I last saw him.  He uses the wall for support.    He has no numbness or tingling in the feet or legs.  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    He has no family history of gait ataxia or similar symptoms.    He has had another hip replacement.  Afterward, PCP added Zoloft to already taken Cymbalta because of depressive symptoms.    He has begun to self catheterize because of urinary retention.  He has had periods of incontinence also.    He continues to do physical therapy.  No other new neurological complaints or concerns.               Review of Systems    Chief Complaint:  Chief Complaint   Patient presents with   ? Head - Dizziness   ? Follow Up     Increased dizziness       Past Medical History:  Medical History:   Diagnosis Date   ? Anxiety disorder    ? Back pain    ? Barrett esophagus    ? BPH (benign prostatic hyperplasia)    ? Cataract    ? Chronic lower back pain    ? Degenerative arthritis    ? Depression (disease)    ? Diverticulitis of colon (without mention of hemorrhage)(562.11)     type 2   ? DM type 2 (diabetes mellitus, type 2) (HCC)    ? Dyslipidemia    ? Gastroparesis    ? GERD (gastroesophageal reflux disease)    ? Gynecomastia    ? History of Nissen fundoplication ~1990   ? HLD (hyperlipidemia)    ? HTN (hypertension)    ? Hx of arthroscopy of knee    ? Hypothyroidism    ? Infection    ? Kidney stones    ? Osteoarthritis    ? Skin cancer    ? Squamous cell carcinoma    ? Thyroid cancer [facility]) 2009       Surgical History:  Surgical History:   Procedure Laterality Date   ? BACK SURGERY  1982   ? CYSTOURETHROSCOPY  2012   ? KIDNEY STONE SURGERY  2012   ? COLONOSCOPY  02/14/11   ? HX APPENDECTOMY  12/12   ? REPEAT LEFT LUMBAR 5 TO SACRAL 1 LAMINOTOMY N/A 01/02/2019    Performed by Karie Chimera, MD at [facility] OR   ? INCISION AND DRAINAGE POSTOPERATIVE WOUND INFECTION - COMPLEX N/A 01/20/2019    Performed by [facility]era[facility] at [facility] OR   ? CATARACT REMOVAL     ? CYSTOSCOPY     ? HX HIP SURGERY Bilateral    ? HX KNEE ARTHROSCOPY     ? HX KNEE SURGERY Bilateral    ? HX LAPAROTOMY      possible colon fistula   ? HX LYMPHADENECTOMY     ? HX ROTATOR CUFF REPAIR Bilateral     [id number]   ? HX SHOULDER REPLACEMENT Bilateral    ? HX THYROIDECTOMY     ? HX VASECTOMY     ? THYROIDECTOMY     ? UPPER GASTROINTESTINAL ENDOSCOPY         Social History:   Social History     Socioeconomic History   ? Marital status: Married   Tobacco Use   ? Smoking status: Never Smoker   ? Smokeless tobacco: Never Used   Vaping Use   ? Vaping Use: Never used   Substance and Sexual Activity   ? Alcohol use: Yes     Comment: Occasional   ? Drug use: No   ? Sexual activity: Yes     Partners: Female             Family History:  Family History   Problem Relation Age of Onset   ? Cancer-Lung Mother    ? Melanoma Mother    ? Cancer Mother    ? Diabetes Mother    ? Heart Disease Mother    ? Diabetes Father    ? Hypertension Sister    ? Heart Disease Sister    ? Cancer Sister    ? Diabetes Sister    ? Arthritis Sister    ? Hypertension Brother    ? Diabetes Brother    ? Arthritis Brother        Allergies:  Allergies   Allergen Reactions   ? Ambien [Zolpidem] HALLUCINATIONS   ? Statins-Hmg-Coa Reductase Inhibitors MUSCLE PAIN       Objective:         ? acetaminophen (TYLENOL) 500 mg tablet Take two tablets by mouth every 6 hours. Max of 4,000 mg of acetaminophen in 24 hours.   ? ascorbic acid 1,000 mg tablet Take 1 tablet by mouth daily.   ? aspirin 81 mg chewable tablet Chew 81 mg by mouth daily. Take with food.   ? atorvastatin (LIPITOR) 20 mg tablet Take 20 mg by mouth daily.   ? blood sugar diagnostic test strip Use 1 strip as directed before meals and at bedtime.   ? buPROPion XL (WELLBUTRIN XL) 300 mg tablet Take 300 mg by mouth every morning. Do not crush or chew.   ? cholecalciferol (Vitamin D3) (VITAMIN D-3) 1,000 units tablet Take 1,000 Units by mouth twice daily.     ? cyanocobalamin (VITAMIN B-12) 500 mcg tablet Take 500 mcg by mouth daily.   ? docusate (COLACE) 100 mg capsule Take one capsule by mouth twice daily. (Patient taking differently: Take 100 mg by mouth as Needed.)   ? duloxetine DR (CYMBALTA) 60 mg capsule Take one capsule by  mouth daily. (Patient taking differently: Take 60 mg by mouth daily. Need refill)   ? dutasteride (AVODART) 0.5 mg capsule Take 1 Cap by mouth daily.   ? Fluocinolone 0.01 % oil Apply  topically to affected area.   ? gabapentin (NEURONTIN) 600 mg tablet Take 1,200 mg by mouth twice daily.   ? glimepiride (AMARYL) 4 mg tablet Take 8 mg by mouth twice daily.   ? hydrocortisone 1 % topical cream Apply  topically to affected area twice daily.   ? insulin glargine (LANTUS SOLOSTAR U-100 INSULIN) 100 unit/mL (3 mL) injection PEN Inject 40 Units under the skin at bedtime daily.   ? insulin lispro (U-100) (HUMALOG KWIKPEN INSULIN) 100 unit/mL subcutaneous PEN Inject 15 Units under the skin three times daily with meals.   ? JARDIANCE 25 mg tablet Take 1 tablet by mouth daily.   ? levothyroxine (SYNTHROID) 200 mcg tablet Take 1 Tab by mouth daily. (Patient taking differently: Take 275 mcg by mouth daily.)   ? metFORMIN (GLUCOPHAGE) 1,000 mg tablet Take 1 Tab by mouth twice daily.   ? metoprolol tartrate (LOPRESSOR) 50 mg tablet Take 50 mg by mouth twice daily.   ? omeprazole DR(+) (PRILOSEC) 40 mg capsule Take 40 mg by mouth twice daily.   ? ondansetron (ZOFRAN ODT) 8 mg rapid dissolve tablet Dissolve 1 tablet by mouth as Needed.   ? oxyCODONE (ROXICODONE) 5 mg tablet Take one tablet to two tablets by mouth every 3 hours as needed for Pain   ? senna/docusate (SENOKOT-S) 8.6/50 mg tablet Take one tablet by mouth daily. (Patient taking differently: Take 2 tablets by mouth twice daily.)   ? SITagliptin (JANUVIA) 100 mg tab tablet Take 100 mg by mouth daily.   ? tamsulosin (FLOMAX) 0.4 mg capsule Take 0.4 mg by mouth daily. Do not crush, chew or open capsules. Take 30 minutes following the same meal each day.   ? triamterene-hydrochlorothiazide (MAXZIDE) 37.5-25 mg tablet Take one-half tablet by mouth every morning.   ? vitamins, multiple tablet Take 1 Tab by mouth daily.     ? ZETIA 10 mg tablet TAKE ONE TABLET BY MOUTH ONCE DAILY     Vitals:    09/07/20 1241   BP: 131/69   BP Source: Arm, Right Upper   Pulse: 88   Temp: 36.7 ?C (98.1 ?F)   Resp: 18   SpO2: 96%   TempSrc: Oral   PainSc: Five   Weight: 113.4 kg (250 lb)   Height: 185.4 cm (6' 1)     Body mass index is 32.98 kg/m?Marland Kitchen       Physical Exam    General: WG/WN,?obese, pleasant?demeanor, cooperative, follows commands  HEENT: NC/AT  Cardiac: RRR without murmur  Speech: fluent, no dysarthria or aphasia  Mental status: Alert, oriented x 4, NAD  ?  CN: PERRL, EOMI without restriction or nystagmus, facial sensation symmetric (V1-V3) to LT bilaterally, No facial droop or ptosis, tongue midline  ?  Strength:   UEs:??????5/5 SA/EF/EE/WE/WF  LEs:??????5/5 HF/KE/DF/PF  No involuntary movements, no tremor  ?  Sensation:?PP without gradient in LEs, decreased in patch of right anterolateral thigh?  ?  DTRs: 2/2 in BR/B/P?0/0A, downgoing plantar reflexes bilaterally  ?  Gait:?antalgic, waddle, drags right leg, wide base, uses cane, ataxic, slow       Assessment and Plan:    1.  Gait ataxia with falls  2.  Diabetes mellitus without identified large fiber polyneuropathy  3.  Mild left cubital tunnel syndrome  4.  Possible left carpal tunnel syndrome  5.  Urinary retention  6.  Right meralgia paresthetica  7.  Unspecified dizziness    Plan:  1. Continue physical therapy.  Focus on fall prevention and mobility/balance  2. Order MRI C spine without GAD - r/o myelopathy  3. Order MRI T-spine w/o GAD  4. Order MRI Lspine w/o GAD  5. B12, MMA, HC, folate, Vit E, Cu, Zn, A1c, ESR, ANA, HIV  6. DC Zoloft.  No indication for SSRI and SNRI  7. Continue Cymbalta 60mg /d  8. DC Wellbutrin in 3 weeks - once stable off Zoloft - concern for combo Wellbutrin/Zoloft/Cymbalta  9. RTC 4 months.

## 2020-09-07 NOTE — Patient Instructions
Top Zoloft now.  If mood is stable off of it, stop Wellbutrin also in 3 weeks.  Do not stop both at the same time.  You may continue Cymbalta.

## 2020-09-21 ENCOUNTER — Encounter: Admit: 2020-09-21 | Discharge: 2020-09-21 | Payer: MEDICARE

## 2020-09-22 ENCOUNTER — Encounter: Admit: 2020-09-22 | Discharge: 2020-09-22 | Payer: MEDICARE

## 2020-09-22 MED ORDER — LORAZEPAM 0.5 MG PO TAB
.5 mg | ORAL_TABLET | ORAL | 0 refills | 12.00000 days | Status: AC | PRN
Start: 2020-09-22 — End: ?

## 2020-09-22 NOTE — Telephone Encounter
0.5mg  Ativan for MRI

## 2020-09-22 NOTE — Telephone Encounter
Patient would like sedation for his MRIs. He said he has used Valium in the past.

## 2020-10-05 ENCOUNTER — Encounter: Admit: 2020-10-05 | Discharge: 2020-10-05 | Payer: MEDICARE

## 2020-10-10 ENCOUNTER — Encounter: Admit: 2020-10-10 | Discharge: 2020-10-10 | Payer: MEDICARE

## 2020-10-24 ENCOUNTER — Encounter: Admit: 2020-10-24 | Discharge: 2020-10-24 | Payer: MEDICARE

## 2020-12-29 ENCOUNTER — Encounter: Admit: 2020-12-29 | Discharge: 2020-12-29 | Payer: MEDICARE

## 2021-02-28 ENCOUNTER — Encounter: Admit: 2021-02-28 | Discharge: 2021-02-28 | Payer: MEDICARE

## 2021-03-02 ENCOUNTER — Encounter: Admit: 2021-03-02 | Discharge: 2021-03-02 | Payer: MEDICARE

## 2021-03-02 DIAGNOSIS — R69 Illness, unspecified: Secondary | ICD-10-CM

## 2021-03-02 NOTE — Telephone Encounter
Spoke with patient today regarding incomplete follow up plan. Dr.Southwell placed orders for labs to be complete prior to follow up. I informed patient labs should be complete within next two days in order to keep scheduled follow up. Patient verbalized understanding and stated he would come in tomorrow 03/03/2021 to complete lab work.

## 2021-03-03 ENCOUNTER — Encounter: Admit: 2021-03-03 | Discharge: 2021-03-03 | Payer: MEDICARE

## 2021-03-03 DIAGNOSIS — B999 Unspecified infectious disease: Secondary | ICD-10-CM

## 2021-03-03 DIAGNOSIS — K319 Disease of stomach and duodenum, unspecified: Secondary | ICD-10-CM

## 2021-03-03 DIAGNOSIS — C73 Malignant neoplasm of thyroid gland: Secondary | ICD-10-CM

## 2021-03-03 DIAGNOSIS — N200 Calculus of kidney: Secondary | ICD-10-CM

## 2021-03-03 DIAGNOSIS — IMO0002 Squamous cell carcinoma: Secondary | ICD-10-CM

## 2021-03-03 DIAGNOSIS — N400 Enlarged prostate without lower urinary tract symptoms: Secondary | ICD-10-CM

## 2021-03-03 DIAGNOSIS — A498 Other bacterial infections of unspecified site: Secondary | ICD-10-CM

## 2021-03-03 DIAGNOSIS — K2270 Barrett's esophagus without dysplasia: Secondary | ICD-10-CM

## 2021-03-03 DIAGNOSIS — F419 Anxiety disorder, unspecified: Secondary | ICD-10-CM

## 2021-03-03 DIAGNOSIS — M549 Dorsalgia, unspecified: Secondary | ICD-10-CM

## 2021-03-03 DIAGNOSIS — E039 Hypothyroidism, unspecified: Secondary | ICD-10-CM

## 2021-03-03 DIAGNOSIS — Z9889 Other specified postprocedural states: Secondary | ICD-10-CM

## 2021-03-03 DIAGNOSIS — M545 Chronic lower back pain: Secondary | ICD-10-CM

## 2021-03-03 DIAGNOSIS — D649 Anemia, unspecified: Secondary | ICD-10-CM

## 2021-03-03 DIAGNOSIS — M1990 Unspecified osteoarthritis, unspecified site: Secondary | ICD-10-CM

## 2021-03-03 DIAGNOSIS — C4490 Unspecified malignant neoplasm of skin, unspecified: Secondary | ICD-10-CM

## 2021-03-03 DIAGNOSIS — E119 Type 2 diabetes mellitus without complications: Secondary | ICD-10-CM

## 2021-03-03 DIAGNOSIS — K5732 Diverticulitis of large intestine without perforation or abscess without bleeding: Secondary | ICD-10-CM

## 2021-03-03 DIAGNOSIS — I10 Essential (primary) hypertension: Secondary | ICD-10-CM

## 2021-03-03 DIAGNOSIS — N390 Urinary tract infection, site not specified: Secondary | ICD-10-CM

## 2021-03-03 DIAGNOSIS — E785 Hyperlipidemia, unspecified: Secondary | ICD-10-CM

## 2021-03-03 DIAGNOSIS — K3184 Gastroparesis: Secondary | ICD-10-CM

## 2021-03-03 DIAGNOSIS — K219 Gastro-esophageal reflux disease without esophagitis: Secondary | ICD-10-CM

## 2021-03-03 DIAGNOSIS — G629 Polyneuropathy, unspecified: Secondary | ICD-10-CM

## 2021-03-03 DIAGNOSIS — N62 Hypertrophy of breast: Secondary | ICD-10-CM

## 2021-03-03 LAB — COMPREHENSIVE METABOLIC PANEL
POTASSIUM: 4.2 MMOL/L (ref >60)
SODIUM: 140 MMOL/L (ref 137–147)

## 2021-03-03 LAB — CBC AND DIFF
ABSOLUTE BASO COUNT: 0 K/UL (ref 0–0.20)
ABSOLUTE EOS COUNT: 0.3 K/UL (ref 0–0.45)
ABSOLUTE LYMPH COUNT: 2.8 K/UL (ref 1.0–4.8)
ABSOLUTE MONO COUNT: 0.7 K/UL (ref 0–0.80)
ABSOLUTE NEUTROPHIL: 5.6 K/UL (ref 1.8–7.0)
BASOPHILS %: 1 % (ref 0–2)
EOSINOPHILS %: 3 % (ref >60)
LYMPHOCYTES %: 30 % (ref 24–44)
MONOCYTES %: 8 % (ref 4–12)
NEUTROPHILS %: 58 % (ref 41–77)
RBC COUNT: 3.9 M/UL — ABNORMAL LOW (ref 4.4–5.5)
RDW: 17 % — ABNORMAL HIGH (ref 11–15)
WBC COUNT: 9.4 K/UL (ref 4.5–11.0)

## 2021-03-03 LAB — THYROGLOBULIN & THYROGLOBULIN AB
ANTI THYROGLOB ANTI: 17 [IU]/mL (ref <116)
THYROGLOBULIN: 35 ng/mL (ref 1.7–55.0)

## 2021-03-03 LAB — IRON + BINDING CAPACITY + %SAT+ FERRITIN
FERRITIN: 19 ng/mL — ABNORMAL LOW (ref 30–300)
IRON BINDING: 462 ug/dL — ABNORMAL HIGH (ref >60)
IRON: 52 ug/dL — ABNORMAL LOW (ref 50–185)

## 2021-03-03 LAB — TSH WITH FREE T4 REFLEX: TSH: 1.5 uU/mL — ABNORMAL LOW (ref 0.35–5.00)

## 2021-03-03 LAB — VITAMIN B12: VITAMIN B12: 551 pg/mL — ABNORMAL LOW (ref 180–914)

## 2021-03-07 ENCOUNTER — Encounter: Admit: 2021-03-07 | Discharge: 2021-03-07 | Payer: MEDICARE

## 2021-03-07 DIAGNOSIS — C73 Malignant neoplasm of thyroid gland: Secondary | ICD-10-CM

## 2021-03-07 LAB — POC GLUCOSE: POC GLUCOSE: 207 mg/dL — ABNORMAL HIGH (ref 70–100)

## 2021-03-07 MED ORDER — RP DX F-18 FDG MCI
10 | Freq: Once | INTRAVENOUS | 0 refills | Status: CP
Start: 2021-03-07 — End: ?
  Administered 2021-03-07: 18:00:00 11.8 via INTRAVENOUS

## 2021-03-09 ENCOUNTER — Encounter: Admit: 2021-03-09 | Discharge: 2021-03-09 | Payer: MEDICARE

## 2021-03-09 DIAGNOSIS — K5732 Diverticulitis of large intestine without perforation or abscess without bleeding: Secondary | ICD-10-CM

## 2021-03-09 DIAGNOSIS — M25561 Pain in right knee: Secondary | ICD-10-CM

## 2021-03-09 DIAGNOSIS — M1990 Unspecified osteoarthritis, unspecified site: Secondary | ICD-10-CM

## 2021-03-09 DIAGNOSIS — G629 Polyneuropathy, unspecified: Secondary | ICD-10-CM

## 2021-03-09 DIAGNOSIS — IMO0002 Squamous cell carcinoma: Secondary | ICD-10-CM

## 2021-03-09 DIAGNOSIS — B999 Unspecified infectious disease: Secondary | ICD-10-CM

## 2021-03-09 DIAGNOSIS — D649 Anemia, unspecified: Secondary | ICD-10-CM

## 2021-03-09 DIAGNOSIS — N400 Enlarged prostate without lower urinary tract symptoms: Secondary | ICD-10-CM

## 2021-03-09 DIAGNOSIS — C73 Malignant neoplasm of thyroid gland: Secondary | ICD-10-CM

## 2021-03-09 DIAGNOSIS — K319 Disease of stomach and duodenum, unspecified: Secondary | ICD-10-CM

## 2021-03-09 DIAGNOSIS — N62 Hypertrophy of breast: Secondary | ICD-10-CM

## 2021-03-09 DIAGNOSIS — M549 Dorsalgia, unspecified: Secondary | ICD-10-CM

## 2021-03-09 DIAGNOSIS — A498 Other bacterial infections of unspecified site: Secondary | ICD-10-CM

## 2021-03-09 DIAGNOSIS — E039 Hypothyroidism, unspecified: Secondary | ICD-10-CM

## 2021-03-09 DIAGNOSIS — E785 Hyperlipidemia, unspecified: Secondary | ICD-10-CM

## 2021-03-09 DIAGNOSIS — C4490 Unspecified malignant neoplasm of skin, unspecified: Secondary | ICD-10-CM

## 2021-03-09 DIAGNOSIS — N200 Calculus of kidney: Secondary | ICD-10-CM

## 2021-03-09 DIAGNOSIS — K3184 Gastroparesis: Secondary | ICD-10-CM

## 2021-03-09 DIAGNOSIS — N390 Urinary tract infection, site not specified: Secondary | ICD-10-CM

## 2021-03-09 DIAGNOSIS — K2270 Barrett's esophagus without dysplasia: Secondary | ICD-10-CM

## 2021-03-09 DIAGNOSIS — E119 Type 2 diabetes mellitus without complications: Secondary | ICD-10-CM

## 2021-03-09 DIAGNOSIS — K219 Gastro-esophageal reflux disease without esophagitis: Secondary | ICD-10-CM

## 2021-03-09 DIAGNOSIS — F419 Anxiety disorder, unspecified: Secondary | ICD-10-CM

## 2021-03-09 DIAGNOSIS — Z9889 Other specified postprocedural states: Secondary | ICD-10-CM

## 2021-03-09 DIAGNOSIS — M545 Chronic lower back pain: Secondary | ICD-10-CM

## 2021-03-09 DIAGNOSIS — I10 Essential (primary) hypertension: Secondary | ICD-10-CM

## 2021-03-09 MED ORDER — OXYCODONE-ACETAMINOPHEN 5-325 MG PO TAB
1-2 | ORAL_TABLET | ORAL | 0 refills | 2.00000 days | Status: AC | PRN
Start: 2021-03-09 — End: ?

## 2021-03-09 NOTE — Progress Notes
Name: Jimmy Waters          MRN: [id number]      DOB: 21-Sep-1955      AGE: 65 y.o.   DATE OF SERVICE: 03/09/2021    Subjective:             Reason for Visit:    Cutaneous metastatic papillary thyroid cancer    Jimmy Waters is a 65 y.o. male.       History of Present Illness    Jimmy Waters is self referred for metastatic papillary thyroid cancer.     PCP:  Caren Griffins  Endocrinology:  Dr. Gemma Payor - [facility]  ENT:  Dr. Roselyn Bering  Dermatology: Silver Huguenin, NP        Prior Treatment (XRT, Surgery, Chemotherapy):    2009 Thyroid Cancer - Thyroidectomy, RAI     2017 Neck Mass- 1/7 LN metastatic Thyroid Cancer     Jan 2018 - RAI 154 mCi     2022 Cutaneous metastatic Papillary thyroid Cancer - Neck nodule [facility]      He had initial thyroid carcinoma in 2009. 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 In September of 2017 he underwent neck dissection. 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 He underwent RAI in January of 2018. Post treatment scan showed some uptake on thyroid bed and mediastinal nodes. There were no enlarged nodes on CT scan.     He saw Dr Lujean Rave once 05/02/2016.     He has had ataxia and disturbed gait. Has seen Dr Tandy Gaw in Neurology at [facility], last in June 2022.   MRI of the head done in January 2022 that showed no acute pathology and mild generalized volume loss and white matter change.  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 No evidence of large fiber neuropathy.  Small fiber neuropathy not excluded. He has been on Cymbalta and Gabapentin.     He sees Dr Sunday Corn again Mar 07, 2021.     Patient has been following with Silver Huguenin, NP at [facility]).  She recently removed a nodule near his thyroidectomy scar.  Pathology confirmed metastatic Papillary Thyroid Cancer.        He is just coming out of inpatient rehab at [facility], recovering from Urosepsis and a recent fall.    Recent imaging reviewed:  07/13/2020 CT Neck w/contrast [facility]  1. No CT evidence of a suspicious neck mass or suspicious lymphadenopathy by CT size criteria.  2. Symmetric prominence of bilateral false and true vocal cores at the level of the hyoid bone and correlate with clinical exam.    Recent laboratory reviewed from February 20, 2021: Hemoglobin 9.9, MCV 95, platelets 352,000, white blood cell count 8.8, calcium 10.2, creatinine 1.4.    The area removed from his neck was a cystlike subcutaneous structure.  There are no other new structures around the area that are enlarged.        Past social history: He is a non-smoker.  He is accompanied by his daughter and wife.  His daughter is a Adult nurse, a Lymphedema specialist, at [employer]    Past medical history: Hyperlipidemia, situational depression, insulin-dependent diabetes, hypertension    Past family history: No other endocrine cancers in the family  INTERVAL HISTORY:      PET scan for staging 03/07/21:  Comparison: CT neck 07/13/2020     FINDINGS:     Head/Neck: Prior thyroidectomy with increased nodular FDG uptake adjacent   to a surgical clip within the right hemithyroid resection bed with a   maximum SUV of 4.7 (index 285, CT image 71). Solitary subcentimeter   hypermetabolic supraclavicular lymph node abutting the left   brachiocephalic vein and demonstrating a maximum SUV of 5.7 (index 308, CT   image 78).     Chest: No suspicious hypermetabolic lesion(s). Bilateral shoulder   arthroplasties with symmetric periprosthetic inflammatory uptake.     Abdomen/Pelvis: No suspicious hypermetabolic lesion(s).     Osseous Structures: No suspicious hypermetabolic lesion(s).     Additional CT Findings: Pleural parenchymal scarring in the left lung   base. Coronary and aortic calcific atherosclerosis. Posterior mediastinal surgical clips. Posterior left rib deformity with bridging of the eighth   and ninth ribs. Several hepatic hypodensities are incompletely evaluated   by PET/CT though likely cysts. Left renal cyst. Nonobstructing right renal   calculus. Bilateral hip arthroplasties.     IMPRESSION   Small hypermetabolic right thyroid bed nodule compatible with local recurrence.   Small hypermetabolic suprasternal nodule compatible with metastasis.                 I reviewed the PET images with the patient, his wife and daughter.     He is mostly limited by his proximal muscle weakness.   Breathing is slowly better.     No other skin changes.  No other palpable lymphadenopathy around the neck or supraclavicular or axillary areas.    He denies any active shortness of air although he is tired and winded with certain activities due to his mild anemia.  No cough or hemoptysis.  No pleurisy.  No    No abdominal pain or cramping.  His appetite is somewhat diminished.  He has had some looser stools.  No urinary symptoms.      Laboratory reviewed:     Latest Reference Range & Units 03/03/21 15:28   Hemoglobin 13.5 - 16.5 GM/DL 16.1 (L)   Hematocrit 40 - 50 % 35.2 (L)   Platelet Count 150 - 400 K/UL 247   White Blood Cells 4.5 - 11.0 K/UL 9.4   Neutrophils 41 - 77 % 58      Latest Reference Range & Units 03/03/21 15:28   Vitamin B12 180 - 914 PG/ML 551   Iron 50 - 185 MCG/DL 52   % Saturation 28 - 42 % 11 (L)   Iron Binding-TIBC 270 - 380 MCG/DL 096 (H)   Ferritin 30 - 300 NG/ML 19 (L)   Sodium 137 - 147 MMOL/L 140   Potassium 3.5 - 5.1 MMOL/L 4.2   Chloride 98 - 110 MMOL/L 104   CO2 21 - 30 MMOL/L 25   Anion Gap 3 - 12  11   Blood Urea Nitrogen 7 - 25 MG/DL 28 (H)   Creatinine 0.4 - 1.24 MG/DL 0.45 (H)      Latest Reference Range & Units 03/03/21 15:28   Glucose 70 - 100 MG/DL 409 (H)   Albumin 3.5 - 5.0 G/DL 4.4   Calcium 8.5 - 81.1 MG/DL 91.4   Total Bilirubin 0.3 - 1.2 MG/DL 0.4   Total Protein 6.0 - 8.0 G/DL 9.2 (H)   AST (SGOT) 7 - 40 U/L 15   ALT (SGPT) 7 - 56 U/L  17   Alk Phosphatase 25 - 110 U/L 93   TSH 0.35 - 5.00 MCU/ML 1.56   Thyroglobulin Ab <116 IU/mL 17   Thyroglobulin 1.7 - 55.0 NG/ML 35.8            Review of Systems    No headaches or vision changes  No swallowing problems  No new lymph nodes enlarged  No bruising or petechiae  No breathing difficulty  The patient denies chest pain or pleurisy  No abdominal pain  No change in bowel habits or gastrointestinal bleeding  No hematuria  There has been no change in arthralgias  No new skin rashes  No leg edema noted      Objective:         ? acetaminophen (TYLENOL) 500 mg tablet Take two tablets by mouth every 6 hours. Max of 4,000 mg of acetaminophen in 24 hours. (Patient taking differently: Take 650 mg by mouth every 6 hours as needed. Max of 4,000 mg of acetaminophen in 24 hours.)   ? ascorbic acid 1,000 mg tablet Take 1 tablet by mouth daily.   ? aspirin 81 mg chewable tablet Chew 81 mg by mouth daily. Take with food.   ? atorvastatin (LIPITOR) 20 mg tablet Take 20 mg by mouth daily.   ? blood sugar diagnostic test strip Use 1 strip as directed before meals and at bedtime.   ? buPROPion XL (WELLBUTRIN XL) 300 mg tablet Take 300 mg by mouth daily.   ? cefdinir (OMNICEF) 300 mg capsule Take 300 mg by mouth twice daily.   ? CHOLEcalciferoL (vitamin D3) (VITAMIN D3) 125 mcg (5,000 unit) capsule Take 5,000 Units by mouth daily.     ? codeine/guaiFENesin (CHERATUSSIN AC) 10/100 mg/5 mL oral solution Take 10 mL by mouth every 4 hours as needed. cough   ? cyanocobalamin (VITAMIN B-12) 500 mcg tablet Take 500 mcg by mouth daily.   ? cyclobenzaprine (FLEXERIL) 5 mg tablet Take 5 mg by mouth twice daily.   ? diazePAM (VALIUM) 5 mg tablet Take 5 mg by mouth every 6 hours as needed for Anxiety.   ? docusate (COLACE) 100 mg capsule Take one capsule by mouth twice daily. (Patient taking differently: Take 100 mg by mouth as Needed.)   ? duloxetine DR (CYMBALTA) 60 mg capsule Take one capsule by mouth daily. (Patient taking differently: Take 60 mg by mouth daily. Need refill)   ? dutasteride (AVODART) 0.5 mg capsule Take 1 Cap by mouth daily.   ? Fluocinolone 0.01 % oil Apply  topically to affected area.   ? gabapentin (NEURONTIN) 600 mg tablet Take 1,200 mg by mouth twice daily.   ? glimepiride (AMARYL) 4 mg tablet Take 8 mg by mouth twice daily.   ? hydrocortisone 1 % topical cream Apply  topically to affected area twice daily.   ? hyoscyamine sulfate (LEVSIN/SL) 0.125 mg sublingual tablet Place 0.125 mg under tongue every 4 hours as needed.   ? insulin glargine (LANTUS SOLOSTAR U-100 INSULIN) 100 unit/mL (3 mL) injection PEN Inject 40 Units under the skin at bedtime daily.   ? insulin lispro (U-100) (HUMALOG KWIKPEN INSULIN) 100 unit/mL subcutaneous PEN Inject 16 Units under the skin three times daily with meals.   ? JARDIANCE 25 mg tablet Take 1 tablet by mouth daily.   ? levothyroxine (SYNTHROID) 200 mcg tablet Take 1 Tab by mouth daily. (Patient taking differently: Take 250 mcg by mouth daily 30 minutes before breakfast.)   ? LORazepam (ATIVAN) 0.5  mg tablet Take one tablet by mouth as Needed for Other... (Take 20 minutes prior to MRI. No driving for at leat 6 hours after taking.).   ? losartan (COZAAR) 25 mg tablet Take 25 mg by mouth daily.   ? metFORMIN (GLUCOPHAGE) 1,000 mg tablet Take 1 Tab by mouth twice daily.   ? metoprolol tartrate 25 mg tablet Take 25 mg by mouth twice daily.   ? omeprazole DR(+) (PRILOSEC) 40 mg capsule Take 40 mg by mouth daily.   ? ondansetron (ZOFRAN ODT) 8 mg rapid dissolve tablet Dissolve 1 tablet by mouth as Needed.   ? oxybutynin XL (DITROPAN XL) 15 mg tablet daily.   ? oxyCODONE (ROXICODONE) 5 mg tablet Take one tablet to two tablets by mouth every 3 hours as needed for Pain (Patient taking differently: Take 5 mg by mouth every 6 hours as needed for Pain.)   ? senna/docusate (SENOKOT-S) 8.6/50 mg tablet Take one tablet by mouth daily. (Patient taking differently: Take 2 tablets by mouth twice daily.)   ? SITagliptin (JANUVIA) 100 mg tab tablet Take 100 mg by mouth daily.   ? tamsulosin (FLOMAX) 0.4 mg capsule Take 0.4 mg by mouth daily. Do not crush, chew or open capsules. Take 30 minutes following the same meal each day.   ? triamterene-hydrochlorothiazide (MAXZIDE) 37.5-25 mg tablet Take one-half tablet by mouth every morning.   ? vitamins, B complex tab Take 1 tablet by mouth daily.   ? vitamins, multiple tablet Take 1 Tab by mouth daily.     ? ZETIA 10 mg tablet TAKE ONE TABLET BY MOUTH ONCE DAILY     There were no vitals filed for this visit.  There is no height or weight on file to calculate BMI.                  Pain Addressed:  N/A    Patient Evaluated for a Clinical Trial: No treatment clinical trial available for this patient.     [place] Cooperative Oncology Group performance status is 1, Restricted in physically strenuous activity but ambulatory and able to carry out work of a light or sedentary nature, e.g., light house work, office work.     Physical Exam       In general, the patient is alert and pleasant  No scleral icterus; no injection of the sclera  No neck lymphadenopathy or supraclavicular lymphadenopathy  Well-healed thyroidectomy scar  No particular residual cystic structure at the left neck where the area of resection was performed    No axillary adenopathy  No thyroid masses  No oral thrush or buccal petechiae  Lungs are clear without wheezing or dullness  Heart is regular, with no murmur  Abdomen is soft, non-tender, with normal bowel sounds, and no pain or mass  Extremities show no edema or tenderness  No rash or petechiae  Normal gait; normal cranial nerves         Assessment and Plan:    1. Papillary thyroid cancer:    Recent subcutaneous or cutaneous metastasis resected.    The pathology does not include margins    PET scan for staging 03/07/21:    Shows two small nodules, one at right neck near airway and the second at right suprasternal area    Discussed these in detail. Almost certainly slow moving. No overt positive margin at left neck skin area.     The patient has received radioactive iodine on 2 previous occasions.    I will  consult with Dr Nila Nephew in Endocrinology to see if she thinks repeat RAI is feasible. Most recent RAI was Jan 2018.     I counseled with the patient that the disease may or may not be iodine sensitive.   Surgery in these locations would be difficult.     He has a slightly high thyroglobulin level, and only low titer thyroglobulin antibodies.          2.  Anemia while hospitalized:    CBC repeated last week along with B12 and iron panel    His anemia is improving with a hemoglobin up to 11.2.  He has a normal MCV.    No B12 deficiency.  He has minor iron deficiency.    HGB is getting better relatively quickly.     Started on some mild oral iron supplementation - QOD          3.  Status post thyroidectomy.  His TSH is normal at 1.56.      I will see him in back at least in 3 months with labs  Endocrinology consult placed.       4. OA knee pain    His PCP tried to put him on oxycodone/apap but the [facility] did not have it until the end of the month and would not transfer his script. I filled #[phone]        Thank you for the opportunity to participate in the care of this nice gentleman.

## 2021-03-23 ENCOUNTER — Encounter: Admit: 2021-03-23 | Discharge: 2021-03-23 | Payer: MEDICARE

## 2021-03-30 ENCOUNTER — Encounter: Admit: 2021-03-30 | Discharge: 2021-03-30 | Payer: MEDICARE

## 2021-03-30 ENCOUNTER — Ambulatory Visit: Admit: 2021-03-30 | Discharge: 2021-03-30 | Payer: MEDICARE

## 2021-03-30 DIAGNOSIS — K2270 Barrett's esophagus without dysplasia: Secondary | ICD-10-CM

## 2021-03-30 DIAGNOSIS — E119 Type 2 diabetes mellitus without complications: Secondary | ICD-10-CM

## 2021-03-30 DIAGNOSIS — E1165 Type 2 diabetes mellitus with hyperglycemia: Secondary | ICD-10-CM

## 2021-03-30 DIAGNOSIS — K3184 Gastroparesis: Secondary | ICD-10-CM

## 2021-03-30 DIAGNOSIS — N62 Hypertrophy of breast: Secondary | ICD-10-CM

## 2021-03-30 DIAGNOSIS — M1990 Unspecified osteoarthritis, unspecified site: Secondary | ICD-10-CM

## 2021-03-30 DIAGNOSIS — K5732 Diverticulitis of large intestine without perforation or abscess without bleeding: Secondary | ICD-10-CM

## 2021-03-30 DIAGNOSIS — I10 Essential (primary) hypertension: Secondary | ICD-10-CM

## 2021-03-30 DIAGNOSIS — C4490 Unspecified malignant neoplasm of skin, unspecified: Secondary | ICD-10-CM

## 2021-03-30 DIAGNOSIS — Z794 Long term (current) use of insulin: Secondary | ICD-10-CM

## 2021-03-30 DIAGNOSIS — E890 Postprocedural hypothyroidism: Secondary | ICD-10-CM

## 2021-03-30 DIAGNOSIS — F419 Anxiety disorder, unspecified: Secondary | ICD-10-CM

## 2021-03-30 DIAGNOSIS — IMO0002 Squamous cell carcinoma: Secondary | ICD-10-CM

## 2021-03-30 DIAGNOSIS — N200 Calculus of kidney: Secondary | ICD-10-CM

## 2021-03-30 DIAGNOSIS — B999 Unspecified infectious disease: Secondary | ICD-10-CM

## 2021-03-30 DIAGNOSIS — C73 Malignant neoplasm of thyroid gland: Secondary | ICD-10-CM

## 2021-03-30 DIAGNOSIS — M549 Dorsalgia, unspecified: Secondary | ICD-10-CM

## 2021-03-30 DIAGNOSIS — N400 Enlarged prostate without lower urinary tract symptoms: Secondary | ICD-10-CM

## 2021-03-30 DIAGNOSIS — G629 Polyneuropathy, unspecified: Secondary | ICD-10-CM

## 2021-03-30 DIAGNOSIS — E039 Hypothyroidism, unspecified: Secondary | ICD-10-CM

## 2021-03-30 DIAGNOSIS — K319 Disease of stomach and duodenum, unspecified: Secondary | ICD-10-CM

## 2021-03-30 DIAGNOSIS — M545 Chronic lower back pain: Secondary | ICD-10-CM

## 2021-03-30 DIAGNOSIS — N390 Urinary tract infection, site not specified: Secondary | ICD-10-CM

## 2021-03-30 DIAGNOSIS — K219 Gastro-esophageal reflux disease without esophagitis: Secondary | ICD-10-CM

## 2021-03-30 DIAGNOSIS — E785 Hyperlipidemia, unspecified: Secondary | ICD-10-CM

## 2021-03-30 DIAGNOSIS — Z9889 Other specified postprocedural states: Secondary | ICD-10-CM

## 2021-03-30 DIAGNOSIS — A498 Other bacterial infections of unspecified site: Secondary | ICD-10-CM

## 2021-03-30 LAB — COMPREHENSIVE METABOLIC PANEL
ALBUMIN: 4 g/dL (ref 3.5–5.0)
ALK PHOSPHATASE: 103 U/L (ref 25–110)
ALT: 17 U/L (ref 7–56)
ANION GAP: 12 (ref 3–12)
AST: 17 U/L (ref 7–40)
BLD UREA NITROGEN: 27 mg/dL — ABNORMAL HIGH (ref 7–25)
CALCIUM: 9.9 mg/dL (ref 8.5–10.6)
CO2: 26 MMOL/L (ref 21–30)
CREATININE: 1.3 mg/dL — ABNORMAL HIGH (ref 0.4–1.24)
EGFR: >60 mL/min (ref >60)
TOTAL BILIRUBIN: 0.3 mg/dL (ref 0.3–1.2)
TOTAL PROTEIN: 8.2 g/dL — ABNORMAL HIGH (ref 6.0–8.0)

## 2021-03-30 LAB — LIPID PROFILE
CHOLESTEROL: 123 mg/dL (ref <200)
HDL: 43 mg/dL (ref >40)
LDL: 74 mg/dL (ref <100)
NON HDL CHOLESTEROL: 80 mg/dL — ABNORMAL HIGH (ref 70–100)
TRIGLYCERIDES: 151 mg/dL — ABNORMAL HIGH (ref <150)

## 2021-03-30 LAB — THYROID STIMULATING HORMONE-TSH: TSH: 9.2 uU/mL — ABNORMAL HIGH (ref 0.35–5.00)

## 2021-03-30 MED ORDER — JARDIANCE 25 MG PO TAB
25 mg | ORAL_TABLET | Freq: Every day | ORAL | 3 refills | Status: AC
Start: 2021-03-30 — End: ?
  Filled 2021-03-30: qty 30, 30d supply, fill #1

## 2021-03-30 MED ORDER — XULTOPHY 100/3.6 100 UNIT-3.6 MG /ML (3 ML) SC INPN
30 [IU] | Freq: Every day | SUBCUTANEOUS | 1 refills | Status: AC
Start: 2021-03-30 — End: ?
  Filled 2021-04-10: qty 15, 50d supply, fill #1

## 2021-03-30 MED ORDER — JARDIANCE 25 MG PO TAB
25 mg | ORAL_TABLET | Freq: Every day | ORAL | 3 refills | Status: DC
Start: 2021-03-30 — End: 2021-03-30

## 2021-03-30 NOTE — Progress Notes
Chief Complaint   Patient presents with   ? Diabetes   ? Hypothyroidism   ? Cancer Surveillance        Date of Service: 03/30/2021    Note to patient: The 21st Century Cures Act makes medical notes like these available to patients in the interest of transparency. However, be advised this is a medical document. It is intended as peer to peer communication. It is written in medical language and may contain abbreviations or verbiage that are unfamiliar. It may appear blunt or direct. Medical documents are intended to carry relevant information, facts as evident, and the clinical opinion of the practitioner.    Referring physician:  Gevena Barre, MD  [address]  [facility] [address],  [place] [address]    Jimmy Waters is a 66 y.o. male. DOB: May 19, 1955   MRN#: [id number]    HPI:  Seen today for thyroid cancer.  Reviewed oncology notes.  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  Jan 2018 - RAI?154 mCi?Post treatment scan showed some uptake on thyroid bed and mediastinal nodes.  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 She removed a nodule near his thyroidectomy scar. ?Pathology confirmed metastatic Papillary Thyroid Cancer.  ?  ?  07/13/2020?CT Neck w/contrast?[facility]  1. No CT evidence of a suspicious neck mass or suspicious lymphadenopathy by CT size criteria.  2. Symmetric prominence of bilateral false and true vocal cores at the level of the hyoid bone and correlate with clinical exam.  ?  He is on levothyroxine 275 mcg daily.  He does not remember when the last time dose was changed.  We do not have recent TSH.    He has Type 2 DM for many years.  Current treatment:  Lantus 40 units daily.  Humalog 16 units with meals.    He was on Trulicity in the past but it was stopped due to thyroid cancer diagnosis.  He was on Jardiance but it was stopped when he developed a kidney problem.  He has nausea off and on.    Glucose readings are elevated especially after meals.  Fasting glucose readings are okay.    Review of Systems   Constitutional: Negative for fever.   HENT: Negative for congestion.    Eyes: Negative for blurred vision.   Respiratory: Negative for cough.    Cardiovascular: Negative for chest pain.   Gastrointestinal: Positive for nausea.   Genitourinary: Negative for frequency.   Musculoskeletal: Negative for myalgias.   Skin: Negative for rash.   Neurological: Negative for headaches.   Endo/Heme/Allergies: Does not bruise/bleed easily.   Psychiatric/Behavioral: Negative for depression.       Medical History:   Diagnosis Date   ? Anxiety disorder    ? Back pain    ? Barrett esophagus    ? BPH (benign prostatic hyperplasia)    ? Cancer of skin    ? Cataract    ? Chronic lower back pain    ? Chronic UTI    ? Degenerative arthritis    ? Depression (disease)    ? Diverticulitis of colon (without mention of hemorrhage)(562.11)     type  2   ? DJD (degenerative joint disease)    ? DM type 2 (diabetes mellitus, type 2) (HCC)    ? Dyslipidemia    ? Gastroparesis    ? GERD (gastroesophageal reflux disease)    ? Gynecomastia    ? History of Nissen fundoplication ~1990   ? HLD (hyperlipidemia)    ? HTN (hypertension)    ? Hx of arthroscopy of knee    ? Hypothyroidism    ? Infection    ? Kidney stones    ? Neuropathy    ? Osteoarthritis    ? Shiga toxin-producing Escherichia coli infection    ? Skin cancer    ? Squamous cell carcinoma    ? Stomach disorder    ? Thyroid cancer [facility]) 2009     Surgical History:   Procedure Laterality Date   ? BACK SURGERY  1982   ? HX THYROIDECTOMY  09/2007   ? CYSTOURETHROSCOPY  2012   ? KIDNEY STONE SURGERY  2012   ? COLONOSCOPY  02/14/2011   ? HX APPENDECTOMY  02/2011 ? REPEAT LEFT LUMBAR 5 TO SACRAL 1 LAMINOTOMY N/A 01/02/2019    Performed by Karie Chimera, MD at [facility] OR   ? INCISION AND DRAINAGE POSTOPERATIVE WOUND INFECTION - COMPLEX N/A 01/20/2019    Performed by Karie Chimera, MD at [facility] OR   ? CATARACT REMOVAL     ? CYSTOSCOPY     ? HX HIP SURGERY Bilateral    ? HX KNEE ARTHROSCOPY     ? HX KNEE SURGERY Bilateral    ? HX LAPAROTOMY      possible colon fistula   ? HX LYMPHADENECTOMY     ? HX ROTATOR CUFF REPAIR Bilateral     [id number]   ? HX SHOULDER REPLACEMENT Bilateral    ? HX VASECTOMY     ? THYROIDECTOMY     ? UPPER GASTROINTESTINAL ENDOSCOPY       Family History   Problem Relation Age of Onset   ? Hypertension Mother    ? Cancer-Lung Mother 93   ? Melanoma Mother    ? Cancer Mother    ? Diabetes Mother    ? Heart Disease Mother    ? Hyperlipidemia Mother    ? Diabetes Father    ? Arthritis-osteo Sister    ? Hypertension Sister    ? Heart Disease Sister    ? Cancer Sister    ? Diabetes Sister    ? Arthritis Sister    ? Cancer Sister    ? Cancer-Lung Brother 80   ? Hyperlipidemia Brother    ? Hypertension Brother    ? Diabetes Brother    ? Arthritis Brother    ? Cancer-Colon Maternal Aunt    ? Cancer Maternal Uncle    ? Unknown to Patient Paternal Aunt    ? Unknown to Patient Paternal Uncle    ? Unknown to Patient Maternal Grandmother    ? Unknown to Patient Maternal Grandfather    ? Unknown to Patient Paternal Grandmother    ? Unknown to Patient Paternal Grandfather      Social History     Socioeconomic History   ? Marital status: Married   Tobacco Use   ? Smoking status: Never   ? Smokeless tobacco: Never   Vaping Use   ? Vaping Use: Never used   Substance and Sexual Activity   ? Alcohol use: Yes     Alcohol/week: 1.0 standard  drink     Types: 1 Drinks containing 0.5 oz of alcohol per week   ? Drug use: No   ? Sexual activity: Yes     Partners: Female         Objective:     ? acetaminophen (TYLENOL) 500 mg tablet Take two tablets by mouth every 6 hours. Max of 4,000 mg of acetaminophen in 24 hours. (Patient taking differently: Take 650 mg by mouth every 6 hours as needed. Max of 4,000 mg of acetaminophen in 24 hours.)   ? ascorbic acid 1,000 mg tablet Take 1 tablet by mouth daily.   ? aspirin 81 mg chewable tablet Chew 81 mg by mouth daily. Take with food.   ? atorvastatin (LIPITOR) 20 mg tablet Take 20 mg by mouth daily.   ? buPROPion XL (WELLBUTRIN XL) 300 mg tablet Take 300 mg by mouth daily.   ? CHOLEcalciferoL (vitamin D3) (VITAMIN D3) 125 mcg (5,000 unit) capsule Take 5,000 Units by mouth daily.     ? cyanocobalamin (VITAMIN B-12) 500 mcg tablet Take 500 mcg by mouth daily.   ? docusate (COLACE) 100 mg capsule Take one capsule by mouth twice daily. (Patient taking differently: Take 100 mg by mouth as Needed.)   ? duloxetine DR (CYMBALTA) 60 mg capsule Take one capsule by mouth daily. (Patient taking differently: Take 60 mg by mouth daily. Need refill)   ? dutasteride (AVODART) 0.5 mg capsule Take 1 Cap by mouth daily.   ? empagliflozin (JARDIANCE) 25 mg tablet Take one tablet by mouth daily.   ? Fluocinolone 0.01 % oil Apply  topically to affected area.   ? gabapentin (NEURONTIN) 600 mg tablet Take 1,200 mg by mouth twice daily.   ? hydrocortisone 1 % topical cream Apply  topically to affected area twice daily.   ? hyoscyamine sulfate (LEVSIN/SL) 0.125 mg sublingual tablet Place 0.125 mg under tongue every 4 hours as needed.   ? insulin degludec-liraglutide (XULTOPHY 100/3.6) 100 unit-3.6 mg /mL (3 mL) syringe Inject 30 units under the skin daily.   ? insulin glargine (LANTUS SOLOSTAR U-100 INSULIN) 100 unit/mL (3 mL) injection PEN Inject 30 Units under the skin at bedtime daily.   ? insulin lispro (U-100) (HUMALOG KWIKPEN INSULIN) 100 unit/mL subcutaneous PEN Inject 16 Units under the skin three times daily with meals.   ? losartan (COZAAR) 25 mg tablet Take 25 mg by mouth daily.   ? metoprolol tartrate 25 mg tablet Take 25 mg by mouth twice daily.   ? omeprazole DR(+) (PRILOSEC) 40 mg capsule Take 40 mg by mouth daily.   ? ondansetron (ZOFRAN ODT) 8 mg rapid dissolve tablet Dissolve 1 tablet by mouth as Needed.   ? oxybutynin XL (DITROPAN XL) 15 mg tablet daily.   ? senna/docusate (SENOKOT-S) 8.6/50 mg tablet Take one tablet by mouth daily. (Patient taking differently: Take 2 tablets by mouth twice daily.)   ? tamsulosin (FLOMAX) 0.4 mg capsule Take 0.4 mg by mouth daily. Do not crush, chew or open capsules. Take 30 minutes following the same meal each day.   ? triamterene-hydrochlorothiazide (MAXZIDE) 37.5-25 mg tablet Take one-half tablet by mouth every morning.   ? vitamins, B complex tab Take 1 tablet by mouth daily.   ? vitamins, multiple tablet Take 1 Tab by mouth daily.     ? ZETIA 10 mg tablet TAKE ONE TABLET BY MOUTH ONCE DAILY     Vitals:    03/30/21 0902   BP: (!) 146/69  Pulse: 89   Temp: 36.4 ?C (97.5 ?F)   Resp: 18   SpO2: 100%   TempSrc: Temporal   PainSc: Six   Weight: 119.3 kg (263 lb)   Height: 180.3 cm (5' 11)     Body mass index is 36.68 kg/m?Marland Kitchen     Physical Exam  Vitals and nursing note reviewed.   Constitutional:       General: He is not in acute distress.     Appearance: Normal appearance. He is not ill-appearing, toxic-appearing or diaphoretic.   HENT:      Head: Normocephalic and atraumatic.      Nose: Nose normal.   Eyes:      Conjunctiva/sclera: Conjunctivae normal.   Pulmonary:      Effort: Pulmonary effort is normal. No respiratory distress.   Abdominal:      General: Abdomen is flat.   Musculoskeletal:         General: No swelling. Normal range of motion.      Cervical back: Normal range of motion.   Skin:     Coloration: Skin is not jaundiced.   Neurological:      Mental Status: He is alert and oriented to person, place, and time.   Psychiatric:         Mood and Affect: Mood normal.               Lab   Thyroid Studies    Lab Results   Component Value Date/Time    TSH 9.27 (H) 03/30/2021 09:58 AM    No results found for: Edsel Petrin, THYBINDGLB         Assessment and Plan:    Jimmy Waters was seen today for diabetes, hypothyroidism and cancer surveillance.    Diagnoses and all orders for this visit:    Thyroid cancer (HCC)  -     [place] HEAD AND NECK; Future; Expected date: 03/30/2021  -     THYROGLOBULIN & THYROGLOBULIN AB; Future; Expected date: 04/26/2021    Postoperative hypothyroidism  -     THYROID STIMULATING HORMONE-TSH; Future; Expected date: 03/30/2021  -     TSH WITH FREE T4 REFLEX; Future; Expected date: 04/26/2021    Uncontrolled type 2 diabetes mellitus with hyperglycemia, with long-term current use of insulin (HCC)  -     COMPREHENSIVE METABOLIC PANEL; Future; Expected date: 03/30/2021  -     LIPID PROFILE; Future; Expected date: 03/30/2021  -     MICROALB/CR RATIO-URINE RANDOM; Future; Expected date: 03/30/2021    Other orders  -     Discontinue: empagliflozin (JARDIANCE) 25 mg tablet; Take one tablet by mouth daily.  -     empagliflozin (JARDIANCE) 25 mg tablet; Take one tablet by mouth daily.  -     insulin degludec-liraglutide (XULTOPHY 100/3.6) 100 unit-3.6 mg /mL (3 mL) syringe; Inject 30 units under the skin daily.      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    Will get ultrasound of neck to look at the nodules.  May consider CT scan.  Goal TSH to be suppressed.  We will check TSH today and if detectable then increase the dose of levothyroxine.    Will discuss the case at thyroid tumor board and reviewed the images with the radiologist to understand the extent of the disease and then decide treatment based on that.  Treatment options include: Surgery to remove the  neck nodules versus suppressing the TSH and monitoring versus radioactive iodine treatment.  It is possible that the thyroid cancer is radioactive iodine refractory.    Continue levothyroxine 275 mcg daily for now.    Uncontrolled type 2 diabetes: Postprandial hyperglycemia.  We will change the treatment to Xultophy: 30 units daily.  It has Victoza.  Please let me know if you have any side effects. This medicine belongs to class of medicine called GLP-1 class.  Medicine can cause nausea, vomiting, fullness in stomach.  Rare episode of pancreatitis has happened with this class of medicine.    Restart Jardiance.  Continue NovoLog 16 units with meals.    He will connect his freestyle libre with [place].      60 minutes total time spent.  Reviewing extensive past history, discussing treatment options, making plan and documentation.  Electronically signed by Lucrezia Starch, MD 03/30/2021

## 2021-03-31 ENCOUNTER — Encounter: Admit: 2021-03-31 | Discharge: 2021-03-31 | Payer: MEDICARE

## 2021-03-31 DIAGNOSIS — E890 Postprocedural hypothyroidism: Secondary | ICD-10-CM

## 2021-04-02 ENCOUNTER — Encounter: Admit: 2021-04-02 | Discharge: 2021-04-02 | Payer: MEDICARE

## 2021-04-02 ENCOUNTER — Ambulatory Visit: Admit: 2021-04-02 | Discharge: 2021-04-02 | Payer: MEDICARE

## 2021-04-02 DIAGNOSIS — C73 Malignant neoplasm of thyroid gland: Secondary | ICD-10-CM

## 2021-04-07 ENCOUNTER — Encounter: Admit: 2021-04-07 | Discharge: 2021-04-07 | Payer: MEDICARE

## 2021-04-10 ENCOUNTER — Encounter: Admit: 2021-04-10 | Discharge: 2021-04-10 | Payer: MEDICARE

## 2021-04-11 ENCOUNTER — Encounter: Admit: 2021-04-11 | Discharge: 2021-04-11 | Payer: MEDICARE

## 2021-04-11 DIAGNOSIS — C73 Malignant neoplasm of thyroid gland: Secondary | ICD-10-CM

## 2021-04-12 ENCOUNTER — Encounter: Admit: 2021-04-12 | Discharge: 2021-04-12 | Payer: MEDICARE

## 2021-04-12 DIAGNOSIS — E0789 Other specified disorders of thyroid: Secondary | ICD-10-CM

## 2021-04-12 DIAGNOSIS — C73 Malignant neoplasm of thyroid gland: Secondary | ICD-10-CM

## 2021-04-12 NOTE — Telephone Encounter
Phone call to patient and his wife Jerrye Beavers.  Discussed today TB recommendations.      CT Neck w/contrast scheduled for 04/14/2021 @ 4p.  Confirmed date/time/location.    IR biopsy pending scheduling.  I will scheduled consult with Dr. Loletta Parish once biopsy scheduled.

## 2021-04-14 ENCOUNTER — Encounter: Admit: 2021-04-14 | Discharge: 2021-04-14 | Payer: MEDICARE

## 2021-04-14 MED ORDER — SODIUM CHLORIDE 0.9 % IJ SOLN
50 mL | Freq: Once | INTRAVENOUS | 0 refills | Status: CP
Start: 2021-04-14 — End: ?
  Administered 2021-04-14: 21:00:00 50 mL via INTRAVENOUS

## 2021-04-14 MED ORDER — IOHEXOL 350 MG IODINE/ML IV SOLN
80 mL | Freq: Once | INTRAVENOUS | 0 refills | Status: CP
Start: 2021-04-14 — End: ?
  Administered 2021-04-14: 21:00:00 80 mL via INTRAVENOUS

## 2021-04-17 ENCOUNTER — Encounter: Admit: 2021-04-17 | Discharge: 2021-04-17 | Payer: MEDICARE

## 2021-04-18 NOTE — Patient Education
Dear Mr Polinski,    Thank you for choosing [facility] for your procedure. Your appointment information is listed below:    Appointment Date: 04/25/2021  Appointment Time:    3PM  Arrival Time:       2PM  Location:     ? [employer]: [address], [place], [place]  [address]  Parking: [address]age    INTERVENTIONAL RADIOLOGY  PRE-PROCEDURE [place] are scheduled for a procedure in Interventional Radiology.  Please follow these instructions and any direction from your Primary Care/Managing Physician.  If you have questions about your procedure or need to reschedule please call [phone].      Medication Instructions:  Continue scheduled morning medications.       Diet Instructions: Maintain regular diet with no restrictions.     Day of Exam Instructions:  1. Bathe or shower with an antibacterial soap prior to your appointment.  2. Bring a list of your current medications and the dosages.  3. Wear comfortable clothing and leave valuables at home.  4. Arrive (1) hour prior to your appointment.  This time will be spent registering, interviewing, assessing, educating, and preparing you for the test.   You will be with [place] anywhere from 30 minutes to 2 hours after your exam depending on your procedure.  5. Depending on the procedure and as instructed by the nurse a responsible adult may be required to drive you home (no Mel[place] Popper, taxis or buses are allowed). If a driver is required and you do not have one  we will be unable to perform your procedure.   6. The nurse will give you discharge instructions about your care and activities after the procedure.

## 2021-04-18 NOTE — Progress Notes
Interventional Radiology Outpatient Scheduling Checklist      1.  Name of Procedure(s):   US Guided Left Supraclavicular Lymph Node Biopsy    04/17/2021 2:42 PM CST by Jasmine Pang, MD     Approved? Yes   Attending Provider Reviewing the Case: Madarang   Case Type: Neuro   Modality: Ultrasound   Sedation Type: Local   Preferred Location: [place]   Physician: Any   Recommended Specimen FNA   IR Assessment & plan note completed No           2.  Date of Procedure:   04/25/2021      3.  Arrival Time:        1400      4.  Procedure Time:   1500      5.  Correct Procedural Room Assignment:  IR CA Room #2      6.  Blood Thinners Triaged and instructed per protocol: Y/N/NA:  NA  Confirmed accurate instructions sent to patient: Y/N:  NA       7.  Procedure Order Verified: Y/N:  Yes      9.  Patient instructed to have a driver: Y/N/NA:  NO Local Only    10.  Patient instructed on NPO status: Y/N/NA:  NO Restrictions  Confirmed accurate instructions sent to patient: Y/N:  Yes    11.  Specimen needed: Y/N/NA:  Yes   Verified Order placed: Y/N:  Yes    12.  Allergies Verified:  Y/N:  Yes    13.  Is there an Iodine Allergy: Y/N:  No  Does the Procedure Require contrast: Y/N:  NO  If so, was the IR- Contrast Allergy Pre-Procedure Medication protocol ordered: Y/NA:  NA    14.  Does the patient have labs according to IR Pre-procedure Laboratory Parameter policy: Y/N/NA:  N/A  If No, was the patient instructed to obtain labs prior to procedure: Y/N/NA:  NA     15.  Will the patient need to be admitted or have a possible admission: Y/N:  No  If yes, confirmed accurate instructions sent to patient: Y/N/NA:  NA     16.  Patient States Understanding: Y/N:  Yes    17.  History of OSA:  Y/N:  No  If yes, confirm request to bring CPAP sent to patient: Y/N/NA:  NA    18. Does the patient have an insulin pump or continuous glucose monitor? Y/N  N/A   If yes, was the patient instructed that this will need to be removed for this procedure and to bring supplies to reapply once the procedure is complete? Y/N    19. Patient was sent electronic procedure instructions: Y/N:  Yes

## 2021-04-19 ENCOUNTER — Encounter: Admit: 2021-04-19 | Discharge: 2021-04-19 | Payer: MEDICARE

## 2021-04-19 ENCOUNTER — Ambulatory Visit: Admit: 2021-04-19 | Discharge: 2021-04-19 | Payer: MEDICARE

## 2021-04-19 DIAGNOSIS — R079 Chest pain, unspecified: Secondary | ICD-10-CM

## 2021-04-25 ENCOUNTER — Ambulatory Visit: Admit: 2021-04-25 | Discharge: 2021-04-25 | Payer: MEDICARE

## 2021-04-25 ENCOUNTER — Encounter: Admit: 2021-04-25 | Discharge: 2021-04-25 | Payer: MEDICARE

## 2021-04-25 DIAGNOSIS — C73 Malignant neoplasm of thyroid gland: Secondary | ICD-10-CM

## 2021-04-25 DIAGNOSIS — B999 Unspecified infectious disease: Secondary | ICD-10-CM

## 2021-04-25 DIAGNOSIS — M549 Dorsalgia, unspecified: Secondary | ICD-10-CM

## 2021-04-25 DIAGNOSIS — K5732 Diverticulitis of large intestine without perforation or abscess without bleeding: Secondary | ICD-10-CM

## 2021-04-25 DIAGNOSIS — N400 Enlarged prostate without lower urinary tract symptoms: Secondary | ICD-10-CM

## 2021-04-25 DIAGNOSIS — Z9889 Other specified postprocedural states: Secondary | ICD-10-CM

## 2021-04-25 DIAGNOSIS — N200 Calculus of kidney: Secondary | ICD-10-CM

## 2021-04-25 DIAGNOSIS — K3184 Gastroparesis: Secondary | ICD-10-CM

## 2021-04-25 DIAGNOSIS — E039 Hypothyroidism, unspecified: Secondary | ICD-10-CM

## 2021-04-25 DIAGNOSIS — E119 Type 2 diabetes mellitus without complications: Secondary | ICD-10-CM

## 2021-04-25 DIAGNOSIS — I10 Essential (primary) hypertension: Secondary | ICD-10-CM

## 2021-04-25 DIAGNOSIS — A498 Other bacterial infections of unspecified site: Secondary | ICD-10-CM

## 2021-04-25 DIAGNOSIS — F419 Anxiety disorder, unspecified: Secondary | ICD-10-CM

## 2021-04-25 DIAGNOSIS — K2270 Barrett's esophagus without dysplasia: Secondary | ICD-10-CM

## 2021-04-25 DIAGNOSIS — M1990 Unspecified osteoarthritis, unspecified site: Secondary | ICD-10-CM

## 2021-04-25 DIAGNOSIS — N62 Hypertrophy of breast: Secondary | ICD-10-CM

## 2021-04-25 DIAGNOSIS — IMO0002 Squamous cell carcinoma: Secondary | ICD-10-CM

## 2021-04-25 DIAGNOSIS — G629 Polyneuropathy, unspecified: Secondary | ICD-10-CM

## 2021-04-25 DIAGNOSIS — K319 Disease of stomach and duodenum, unspecified: Secondary | ICD-10-CM

## 2021-04-25 DIAGNOSIS — K219 Gastro-esophageal reflux disease without esophagitis: Secondary | ICD-10-CM

## 2021-04-25 DIAGNOSIS — C4490 Unspecified malignant neoplasm of skin, unspecified: Secondary | ICD-10-CM

## 2021-04-25 DIAGNOSIS — M545 Chronic lower back pain: Secondary | ICD-10-CM

## 2021-04-25 DIAGNOSIS — N390 Urinary tract infection, site not specified: Secondary | ICD-10-CM

## 2021-04-25 DIAGNOSIS — E785 Hyperlipidemia, unspecified: Secondary | ICD-10-CM

## 2021-04-25 LAB — POC GLUCOSE
POC GLUCOSE: 69 mg/dL — ABNORMAL LOW (ref 70–100)
POC GLUCOSE: 91 mg/dL (ref 70–100)

## 2021-04-25 MED ORDER — DIAZEPAM 5 MG PO TAB
10 mg | Freq: Once | ORAL | 0 refills | Status: CP
Start: 2021-04-25 — End: ?
  Administered 2021-04-25: 21:00:00 10 mg via ORAL

## 2021-04-25 NOTE — Progress Notes
Pt recovery complete.     Ambulatory status: Wheelchair- Assisted Transfers.   Vitals stable. Dressing is clean, dry, and intact.  Pain level returned to baseline.  Patient and wife and wife aware that there was no procedure done/necessary.    Pt discharged to main lobby via wheelchair transport.

## 2021-04-25 NOTE — Progress Notes
Pt arrived to IR pre/post for procedure work up.     Mobility status: Ambulatory with walker  Procedure and discharge instructions reviewed and pt verbalizes understanding.   Pt cart locked in low position, call light within reach.   See doc flowsheets for further details.    Will continue to monitor patient.

## 2021-04-25 NOTE — H&P (View-Only)
IR Pre-Procedure History and Physical/Sedation Plan    Procedure Date: 04/25/2021     Planned Procedure(s):  Left supraclavicular lymph node biopsy     Indication:  Tissue diagnosis   __________________________________________________________________    Chief Complaint:  Lymphadenopathy     History of Present Illness: Jimmy Waters is a 66 y.o. male with a history as listed below who presents today for procedure.    Patient Active Problem List    Diagnosis Date Noted   ? Postoperative infection, initial encounter 01/20/2019   ? Lumbar stenosis with neurogenic claudication 01/02/2019   ? Lumbar radiculopathy 01/02/2019   ? Gastroesophageal reflux disease 05/06/2016   ? Chronic low back pain 05/06/2016   ? Dry mouth 12/01/2015   ? Postural dizziness with presyncope 12/01/2015   ? Medication side effects 10/06/2015   ? Pure hypercholesterolemia 10/06/2015   ? Agatston coronary artery calcium score greater than 400 09/14/2015   ? Obesity (BMI 30.0-34.9) 10/22/2014   ? Autonomic instability 09/20/2014   ? Chronic neck pain 05/02/2014   ? Low back pain with sciatica 05/02/2014   ? Numbness in both hands 05/02/2014   ? Insomnia 11/06/2013   ? Nephrolithiasis 05/13/2013   ? Balanitis 09/16/2012   ? Lower urinary tract infectious disease 08/19/2012   ? 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 E coli enteritis 12/23/2010   ? DM (diabetes mellitus) (HCC) 12/23/2010   ? HTN (hypertension) 12/23/2010   ? Hyperlipidemia 12/23/2010   ? Hypothyroidism 12/23/2010     Medical History:   Diagnosis Date   ? Anxiety disorder    ? Back pain    ? Barrett esophagus    ? BPH (benign prostatic hyperplasia)    ? Cancer of skin    ? Cataract    ? Chronic lower back pain    ? Chronic UTI    ? Degenerative arthritis    ? Depression (disease)    ? Diverticulitis of colon (without mention of hemorrhage)(562.11)     type 2   ? DJD (degenerative joint disease)    ? DM type 2 (diabetes mellitus, type 2) (HCC)    ? Dyslipidemia    ? Gastroparesis    ? GERD (gastroesophageal reflux disease)    ? Gynecomastia    ? History of Nissen fundoplication ~1990   ? HLD (hyperlipidemia)    ? HTN (hypertension)    ? Hx of arthroscopy of knee    ? Hypothyroidism    ? Infection    ? Kidney stones    ? Neuropathy    ? Osteoarthritis    ? Shiga toxin-producing Escherichia coli infection    ? Skin cancer    ? Squamous cell carcinoma    ? Stomach disorder    ? Thyroid cancer [facility]) 2009      Surgical History:   Procedure Laterality Date   ? BACK SURGERY  1982   ? HX THYROIDECTOMY  09/2007   ? CYSTOURETHROSCOPY  2012   ? KIDNEY STONE SURGERY  2012   ? COLONOSCOPY  02/14/2011   ? HX APPENDECTOMY  02/2011   ? REPEAT LEFT LUMBAR 5 TO SACRAL 1 LAMINOTOMY N/A 01/02/2019    Performed by Karie Chimera, MD at [facility] OR   ? INCISION AND DRAINAGE POSTOPERATIVE WOUND INFECTION - COMPLEX N/A 01/20/2019    Performed by Karie Chimera, MD at [facility] OR   ? CATARACT REMOVAL     ?  CYSTOSCOPY     ? HX HIP SURGERY Bilateral    ? HX KNEE ARTHROSCOPY     ? HX KNEE SURGERY Bilateral    ? HX LAPAROTOMY      possible colon fistula   ? HX LYMPHADENECTOMY     ? HX ROTATOR CUFF REPAIR Bilateral     [id number]   ? HX SHOULDER REPLACEMENT Bilateral    ? HX VASECTOMY     ? THYROIDECTOMY     ? UPPER GASTROINTESTINAL ENDOSCOPY        Social History     Tobacco Use   ? Smoking status: Never   ? Smokeless tobacco: Never   Substance Use Topics   ? Alcohol use: Yes     Alcohol/week: 1.0 standard drink     Types: 1 Drinks containing 0.5 oz of alcohol per week      Family History   Problem Relation Age of Onset   ? Hypertension Mother    ? Cancer-Lung Mother 56   ? Melanoma Mother    ? Cancer Mother    ? Diabetes Mother    ? Heart Disease Mother    ? Hyperlipidemia Mother    ? Diabetes Father    ? Arthritis-osteo Sister    ? Hypertension Sister    ? Heart Disease Sister    ? Cancer Sister    ? Diabetes Sister    ? Arthritis Sister    ? Cancer Sister    ? Cancer-Lung Brother 80   ? Hyperlipidemia Brother    ? Hypertension Brother    ? Diabetes Brother    ? Arthritis Brother    ? Cancer-Colon Maternal Aunt    ? Cancer Maternal Uncle    ? Unknown to Patient Paternal Aunt    ? Unknown to Patient Paternal Uncle    ? Unknown to Patient Maternal Grandmother    ? Unknown to Patient Maternal Grandfather    ? Unknown to Patient Paternal Grandmother    ? Unknown to Patient Paternal Grandfather       Medications Prior to Admission   Medication Sig Dispense Refill Last Dose   ? acetaminophen (TYLENOL) 500 mg tablet Take two tablets by mouth every 6 hours. Max of 4,000 mg of acetaminophen in 24 hours. (Patient taking differently: Take 650 mg by mouth every 6 hours as needed. Max of 4,000 mg of acetaminophen in 24 hours.) 180 tablet 0 Past Week   ? ascorbic acid 1,000 mg tablet Take 1 tablet by mouth daily.   04/24/2021   ? aspirin 81 mg chewable tablet Chew 81 mg by mouth daily. Take with food.   04/25/2021   ? atorvastatin (LIPITOR) 20 mg tablet Take 20 mg by mouth daily.   04/25/2021   ? buPROPion XL (WELLBUTRIN XL) 300 mg tablet Take 300 mg by mouth daily.   04/25/2021   ? CHOLEcalciferoL (vitamin D3) (VITAMIN D3) 125 mcg (5,000 unit) capsule Take 5,000 Units by mouth daily.     04/25/2021   ? dutasteride (AVODART) 0.5 mg capsule Take 1 Cap by mouth daily. 90 Cap 3 04/24/2021   ? empagliflozin (JARDIANCE) 25 mg tablet Take one tablet by mouth daily. 90 tablet 3 04/25/2021   ? Fluocinolone 0.01 % oil Apply  topically to affected area.   04/24/2021   ? gabapentin (NEURONTIN) 600 mg tablet Take 1,200 mg by mouth twice daily.   04/25/2021   ? hydrocortisone 1 % topical cream Apply  topically to affected  area twice daily.   04/25/2021   ? insulin degludec-liraglutide (XULTOPHY 100/3.6) 100 unit-3.6 mg /mL (3 mL) syringe Inject 30 units under the skin daily. 30 mL 1 04/24/2021   ? insulin lispro (U-100) (HUMALOG KWIKPEN INSULIN) 100 unit/mL subcutaneous PEN Inject 16 Units under the skin three times daily with meals.   04/25/2021   ? metoprolol tartrate 25 mg tablet Take 25 mg by mouth twice daily.   04/25/2021   ? omeprazole DR(+) (PRILOSEC) 40 mg capsule Take 40 mg by mouth daily.   04/25/2021   ? ondansetron (ZOFRAN ODT) 8 mg rapid dissolve tablet Dissolve 1 tablet by mouth as Needed.   04/24/2021   ? oxybutynin XL (DITROPAN XL) 15 mg tablet daily.   04/25/2021   ? senna/docusate (SENOKOT-S) 8.6/50 mg tablet Take one tablet by mouth daily. (Patient taking differently: Take 2 tablets by mouth twice daily.) 20 tablet 0 04/25/2021   ? tamsulosin (FLOMAX) 0.4 mg capsule Take 0.4 mg by mouth daily. Do not crush, chew or open capsules. Take 30 minutes following the same meal each day.   04/25/2021   ? vitamins, multiple tablet Take 1 Tab by mouth daily.        ? ZETIA 10 mg tablet TAKE ONE TABLET BY MOUTH ONCE DAILY 90 Tab 1      Allergies   Allergen Reactions   ? Ambien [Zolpidem] HALLUCINATIONS   ? Statins-Hmg-Coa Reductase Inhibitors MUSCLE PAIN       Review of Systems  Constitutional: negative  Respiratory: negative  Cardiovascular: negative    Physical Exam:  Vital Signs: Last Filed In 24 Hours Vital Signs: 24 Hour Range                General appearance: Alert and no distress noted.  Neurologic: Grossly normal.  Lungs: Non labored at rest  Heart: Regular rate and rhythm  Abdomen: Non-distended    Pre-procedure anxiolysis plan: Valium PO  Sedation/Medication Plan: Local anesthetic  Personal history of sedation complications: Denies adverse event.   Family history of sedation complications: Denies adverse event.   Medications for Reversal: NA  Discussion/Reviews:  Physician has discussed risks and alternatives of this type of sedation and above planned procedures with patient    NPO Status: NA  Airway:  NA  Head and Neck: NA  Mouth: NA   Pregnancy Status: N/A    Lab/Radiology/Other Diagnostic Tests:  Labs:  Pertinent labs reviewed           Esha Fincher P Divine-Thiele, APRN-NP  Pager [phone]

## 2021-04-25 NOTE — Other
Immediate Post Procedure Note    Date:  04/25/2021                                           Attending Physician:   Alene Mires  Assistant(s):  Colletta Maryland    Procedure(s):  Aborted supra-sternal lymph node bx  Indications:  Poss lymph node  Findings:  Area scanned extensively with U/S - lymph node or mass lesion seen  Anesthesia: Local 5 mL 1% lidocaine without epinephrine  Sedation/Medication Plan: Other    Time out performed: Consent obtained, correct patient verified, correct procedure verified, correct site verified, patient marked as necessary.  Estimated Blood Loss:  None/Negligible  Specimen(s) Removed/Disposition:  None  Complications: None  Comments:    Etter Sjogren, MD

## 2021-04-26 ENCOUNTER — Encounter: Admit: 2021-04-26 | Discharge: 2021-04-26 | Payer: MEDICARE

## 2021-05-05 ENCOUNTER — Encounter: Admit: 2021-05-05 | Discharge: 2021-05-05 | Payer: MEDICARE

## 2021-05-05 ENCOUNTER — Ambulatory Visit: Admit: 2021-05-05 | Discharge: 2021-05-05 | Payer: MEDICARE

## 2021-05-05 ENCOUNTER — Inpatient Hospital Stay: Admit: 2021-05-05 | Discharge: 2021-05-05 | Payer: MEDICARE

## 2021-05-05 DIAGNOSIS — K3184 Gastroparesis: Secondary | ICD-10-CM

## 2021-05-05 DIAGNOSIS — I10 Essential (primary) hypertension: Secondary | ICD-10-CM

## 2021-05-05 DIAGNOSIS — K219 Gastro-esophageal reflux disease without esophagitis: Secondary | ICD-10-CM

## 2021-05-05 DIAGNOSIS — C73 Malignant neoplasm of thyroid gland: Secondary | ICD-10-CM

## 2021-05-05 DIAGNOSIS — M1990 Unspecified osteoarthritis, unspecified site: Secondary | ICD-10-CM

## 2021-05-05 DIAGNOSIS — F419 Anxiety disorder, unspecified: Secondary | ICD-10-CM

## 2021-05-05 DIAGNOSIS — M545 Chronic lower back pain: Secondary | ICD-10-CM

## 2021-05-05 DIAGNOSIS — K2270 Barrett's esophagus without dysplasia: Secondary | ICD-10-CM

## 2021-05-05 DIAGNOSIS — K319 Disease of stomach and duodenum, unspecified: Secondary | ICD-10-CM

## 2021-05-05 DIAGNOSIS — N400 Enlarged prostate without lower urinary tract symptoms: Secondary | ICD-10-CM

## 2021-05-05 DIAGNOSIS — A498 Other bacterial infections of unspecified site: Secondary | ICD-10-CM

## 2021-05-05 DIAGNOSIS — G629 Polyneuropathy, unspecified: Secondary | ICD-10-CM

## 2021-05-05 DIAGNOSIS — B999 Unspecified infectious disease: Secondary | ICD-10-CM

## 2021-05-05 DIAGNOSIS — N200 Calculus of kidney: Secondary | ICD-10-CM

## 2021-05-05 DIAGNOSIS — E039 Hypothyroidism, unspecified: Secondary | ICD-10-CM

## 2021-05-05 DIAGNOSIS — IMO0002 Squamous cell carcinoma: Secondary | ICD-10-CM

## 2021-05-05 DIAGNOSIS — E785 Hyperlipidemia, unspecified: Secondary | ICD-10-CM

## 2021-05-05 DIAGNOSIS — K5732 Diverticulitis of large intestine without perforation or abscess without bleeding: Secondary | ICD-10-CM

## 2021-05-05 DIAGNOSIS — N62 Hypertrophy of breast: Secondary | ICD-10-CM

## 2021-05-05 DIAGNOSIS — C4490 Unspecified malignant neoplasm of skin, unspecified: Secondary | ICD-10-CM

## 2021-05-05 DIAGNOSIS — M549 Dorsalgia, unspecified: Secondary | ICD-10-CM

## 2021-05-05 DIAGNOSIS — N390 Urinary tract infection, site not specified: Secondary | ICD-10-CM

## 2021-05-05 DIAGNOSIS — Z9889 Other specified postprocedural states: Secondary | ICD-10-CM

## 2021-05-05 DIAGNOSIS — E119 Type 2 diabetes mellitus without complications: Secondary | ICD-10-CM

## 2021-05-05 NOTE — Patient Instructions
Head and Neck Endocrine Glands    Thyroid Gland     The thyroid gland is a butterfly shaped endocrine organ that produces and releases Thyroid hormones (T3 & T4) from the lower midline portion of your neck.     Thyroid hormones are responsible for performing a variety of functions, including:    Overall Body Homeostasis Metabolism (Contribute to Weight Gain/Loss)   Heart Rate & Heart Contractability Respiratory Rate    Reproductive Health Body Temperature   Bone Growth and Development Mood     parathyroid glands     The parathyroid glands are endocrine organs that produce and release parathyroid hormone (PTH). There are four parathyroid glands (2 on each side) located on the back surface of your thyroid gland.    Parathyroid hormone is responsible for maintaining the correct amount of calcium in the body.   If one or more gland produces too much PTH = elevated calcium - too little PTH = lower calcium.    Elevated calcium in the bloodstream can lead to memory fog, fatigue, kidney stones, excess urination, osteoporosis and abnormal bone fractures, abdominal pain, nausea/vomiting, constipation, mood fluctuations/depression, body aches/muscle pain.     Low calcium can lead to tingling/numbness around the mouth and finger tips, muscle spasms, heart arrhythmiax, and coma.         General Instructions for Surgery Patients    You are scheduled for surgery on: 06/07/21    Your surgeon is:  Dr. Jeanett Schlein                                  Your nurse coordinator is:  Louie Bun RN     Contact Information:  Main Office: [phone], hours Monday thru Friday 8:00am-4:30pm option #1.      If you are unable to contact your physician, please contact same day surgery [phone]    When to arrive  A representative from the operating room will call after 3pm on the last business day before your procedure with information about when you should arrive at the hospital. If your procedure is scheduled for Monday, you will receive this call on Friday.    If you do not receive a call from the operating room representative about your arrival time by 4pm, call the Preoperative Evaluation Clinic.  Between 3:30pm and 6pm - Call [phone] or [phone] (from [place] only). After 6pm - Call [phone].    If you are coming from out of town and staying overnight in [place], please provide [place] with your local number. It is very important that we are able to contact you.    Parking    Park in the P5 parking garage off of [address].    Enter the [facility] through the 1st floor main Entrance and check in with    admitting on 1st floor.     Bringing Visitors   As a courtesy to other families and visitors, please limit the number of your waiting room  visitors to ONE.    What to bring?   Bring a list of all medications you are currently taking. Bring your insurance information or insurance/prescription cards. If you have a living will or a durable power of attorney, please bring a copy to be included in your chart. Please bring appropriate containers for glasses, contacts, dentures or partials. Do not bring money, jewelry or other valuables. The hospital is not  responsible for loss or breakage of personal items.    Preparing for Your Procedure  Eating and Drinking   DO NOT eat or drink anything after 12:00 midnight on the night before your surgery. You may brush your teeth and rinse your mouth, but your stomach should be completely empty. (Water is allowed, No coffee, No gum, candy or mints.)    Hygiene  Bathe or shower the night before or morning of your procedure.    What to Wear  Dress comfortably. A gown and slippers will be provided for you. Remove all jewelry and body piercings. Do not wear make-up or fingernail polish.      Medications   Please make [place] aware of any and all prescription, over-the-counter, or supplements you are taking. The surgical team will let you know know whether to take any doses of your prescription medications the morning of your procedure. Do not take diabetic medication (pills as well as insulin shots).    These medicines that can thin the blood should be avoided 7 days before your surqery:   Blood thinners: Warfarin (Coumadin), Heparin, Lovenox, Plavix, Ticlid, Xarelto, Pradaxa, Persantine, Aspirin (Ecotrin, Excedrin, Pamprin), or any other blood thinner.     If you take aspirin or any blood thinners, please contact the prescribing physician for  recommendation regarding holding these medications prior to surgery.     Do not stop taking any heart medications or aspirin without seeking the    Approval of your Cardiologist.     Non-Steroidal Anti-Inflammatory Drugs (NSAIDS): such as: Aspirin (Ecotrin, Excedrin, and Pamprin), Ibuprofen (Advil, Motrin), Naproxyn (Aleve) or any other anti-inflammatory medication  Other medications to avoid: Lortab, Darvocent-N, or cold medications such as Alka Seltzer, Sine-Off, Vitamin E (more than 400units).    If scheduling permits please stop vitamins, minerals and herbal supplements, including Garlic, Ginkgo, [place], Feverfew and Fish Oil two weeks before your procedure.  Going Home     You must have someone available to drive you home after your surgery.  Notify Your Physician If you become ill the day before your procedure with fever, cold symptoms, sore throat, nausea, vomiting or other illness.        [facility] Address and Instructions    [address]  [place], [place] [address]    [facility] is on the main hospital campus at the [employer] [address] and [employer].     We are pleased to serve you and appreciate your trust in our care. On the day of your procedure:  Park in the P5 parking garage on the corner of [address] and [address]. The fee is $3 with validation; bring your ticket to any information desk for validation.  Enter [facility] through the main entrance on the street level.  Check in at the admitting desk on Level 1.  When your registration is complete, check in with the waiting room attendant. You will be escorted to the location of your procedure.  Your family or friends may wait comfortably in the common areas on Level 1.

## 2021-05-08 ENCOUNTER — Encounter: Admit: 2021-05-08 | Discharge: 2021-05-08 | Payer: MEDICARE

## 2021-05-08 DIAGNOSIS — C799 Secondary malignant neoplasm of unspecified site: Secondary | ICD-10-CM

## 2021-05-08 NOTE — Progress Notes
H&E Slides of [facility] # [id number], Path Date 02/07/2021, requested on 05/08/2021 from [facility].        [facility] shipping label faxed (fax # [phone]) with shipment tracking # [id number].

## 2021-05-10 ENCOUNTER — Encounter: Admit: 2021-05-10 | Discharge: 2021-05-10 | Payer: MEDICARE

## 2021-05-10 DIAGNOSIS — E119 Type 2 diabetes mellitus without complications: Secondary | ICD-10-CM

## 2021-05-11 ENCOUNTER — Encounter: Admit: 2021-05-11 | Discharge: 2021-05-11 | Payer: MEDICARE

## 2021-05-11 DIAGNOSIS — C73 Malignant neoplasm of thyroid gland: Secondary | ICD-10-CM

## 2021-05-11 DIAGNOSIS — C799 Secondary malignant neoplasm of unspecified site: Secondary | ICD-10-CM

## 2021-05-12 ENCOUNTER — Ambulatory Visit: Admit: 2021-05-12 | Discharge: 2021-05-12 | Payer: MEDICARE

## 2021-05-12 ENCOUNTER — Encounter: Admit: 2021-05-12 | Discharge: 2021-05-12 | Payer: MEDICARE

## 2021-05-12 DIAGNOSIS — I10 Essential (primary) hypertension: Secondary | ICD-10-CM

## 2021-05-12 DIAGNOSIS — Z9889 Other specified postprocedural states: Secondary | ICD-10-CM

## 2021-05-12 DIAGNOSIS — M549 Dorsalgia, unspecified: Secondary | ICD-10-CM

## 2021-05-12 DIAGNOSIS — C73 Malignant neoplasm of thyroid gland: Secondary | ICD-10-CM

## 2021-05-12 DIAGNOSIS — K5732 Diverticulitis of large intestine without perforation or abscess without bleeding: Secondary | ICD-10-CM

## 2021-05-12 DIAGNOSIS — N400 Enlarged prostate without lower urinary tract symptoms: Secondary | ICD-10-CM

## 2021-05-12 DIAGNOSIS — M545 Chronic lower back pain: Secondary | ICD-10-CM

## 2021-05-12 DIAGNOSIS — B999 Unspecified infectious disease: Secondary | ICD-10-CM

## 2021-05-12 DIAGNOSIS — A498 Other bacterial infections of unspecified site: Secondary | ICD-10-CM

## 2021-05-12 DIAGNOSIS — E039 Hypothyroidism, unspecified: Secondary | ICD-10-CM

## 2021-05-12 DIAGNOSIS — M1990 Unspecified osteoarthritis, unspecified site: Secondary | ICD-10-CM

## 2021-05-12 DIAGNOSIS — C4490 Unspecified malignant neoplasm of skin, unspecified: Secondary | ICD-10-CM

## 2021-05-12 DIAGNOSIS — E119 Type 2 diabetes mellitus without complications: Secondary | ICD-10-CM

## 2021-05-12 DIAGNOSIS — N62 Hypertrophy of breast: Secondary | ICD-10-CM

## 2021-05-12 DIAGNOSIS — IMO0002 Squamous cell carcinoma: Secondary | ICD-10-CM

## 2021-05-12 DIAGNOSIS — N200 Calculus of kidney: Secondary | ICD-10-CM

## 2021-05-12 DIAGNOSIS — E034 Atrophy of thyroid (acquired): Secondary | ICD-10-CM

## 2021-05-12 DIAGNOSIS — N390 Urinary tract infection, site not specified: Secondary | ICD-10-CM

## 2021-05-12 DIAGNOSIS — G629 Polyneuropathy, unspecified: Secondary | ICD-10-CM

## 2021-05-12 DIAGNOSIS — F419 Anxiety disorder, unspecified: Secondary | ICD-10-CM

## 2021-05-12 DIAGNOSIS — K319 Disease of stomach and duodenum, unspecified: Secondary | ICD-10-CM

## 2021-05-12 DIAGNOSIS — E1165 Type 2 diabetes mellitus with hyperglycemia: Secondary | ICD-10-CM

## 2021-05-12 DIAGNOSIS — K3184 Gastroparesis: Secondary | ICD-10-CM

## 2021-05-12 DIAGNOSIS — K2270 Barrett's esophagus without dysplasia: Secondary | ICD-10-CM

## 2021-05-12 DIAGNOSIS — E785 Hyperlipidemia, unspecified: Secondary | ICD-10-CM

## 2021-05-12 DIAGNOSIS — K219 Gastro-esophageal reflux disease without esophagitis: Secondary | ICD-10-CM

## 2021-05-12 MED ORDER — TRULICITY 0.75 MG/0.5 ML SC PNIJ
.75 mg | SUBCUTANEOUS | 1 refills | Status: AC
Start: 2021-05-12 — End: ?

## 2021-05-12 NOTE — Progress Notes
Chief Complaint   Patient presents with   ? Hypothyroidism   ? Cancer Surveillance        Date of Service: 05/12/2021    Note to patient: The 21st Century Cures Act makes medical notes like these available to patients in the interest of transparency. However, be advised this is a medical document. It is intended as peer to peer communication. It is written in medical language and may contain abbreviations or verbiage that are unfamiliar. It may appear blunt or direct. Medical documents are intended to carry relevant information, facts as evident, and the clinical opinion of the practitioner.    Jimmy Waters is a 66 y.o. male. DOB: 27-Oct-1955   MRN#: [id number]    HPI:  Seen today for thyroid cancer.  Reviewed oncology notes.  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  Jan 2018 - RAI?154 mCi?Post treatment scan showed some uptake on thyroid bed and mediastinal nodes.  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 She removed a nodule near his thyroidectomy scar. ?Pathology confirmed metastatic Papillary Thyroid Cancer.  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  Plan for surgery by ENT.  ?  ?  07/13/2020?CT Neck w/contrast?[facility]  1. No CT evidence of a suspicious neck mass or suspicious lymphadenopathy by CT size criteria.  2. Symmetric prominence of bilateral false and true vocal cores at the level of the hyoid bone and correlate with clinical exam.  ?  He is on levothyroxine 300 mcg daily.     He has Type 2 DM for many years.  Current treatment:  Jardiance daily.  Humalog 18 units with meals.  Xultophy: 30 units daily.  He has nausea with Xultophy.    He was on Trulicity in the past but it was stopped due to thyroid cancer diagnosis.  He was on Jardiance but it was stopped when he developed a kidney problem.  He has nausea off and on.    Glucose readings are elevated especially after meals.  Fasting glucose readings are okay.    Review of Systems   Constitutional: Negative for fever.   Cardiovascular: Negative for chest pain.       Medical History:   Diagnosis Date   ? Anxiety disorder    ? Back pain    ? Barrett esophagus    ? BPH (benign prostatic hyperplasia)    ? Cancer of skin    ? Cataract    ? Chronic lower back pain    ? Chronic UTI    ? Degenerative arthritis    ? Depression (disease)    ? Diverticulitis of colon (without mention of hemorrhage)(562.11)     type 2   ? DJD (degenerative joint disease)    ? DM type 2 (diabetes mellitus, type 2) (HCC)    ? Dyslipidemia    ? Gastroparesis    ? GERD (gastroesophageal reflux disease)    ? Gynecomastia    ? History of Nissen fundoplication ~1990   ? HLD (hyperlipidemia)    ? HTN (hypertension)    ? Hx of arthroscopy of knee    ? Hypothyroidism    ? Infection    ? Kidney stones    ?  Neuropathy    ? Osteoarthritis    ? Shiga toxin-producing Escherichia coli infection    ? Skin cancer    ? Squamous cell carcinoma    ? Stomach disorder    ? Thyroid cancer [facility]) 2009     Surgical History:   Procedure Laterality Date   ? BACK SURGERY  1982   ? HX THYROIDECTOMY  09/2007   ? CYSTOURETHROSCOPY  2012   ? KIDNEY STONE SURGERY  2012   ? COLONOSCOPY  02/14/2011   ? HX APPENDECTOMY  02/2011   ? REPEAT LEFT LUMBAR 5 TO SACRAL 1 LAMINOTOMY N/A 01/02/2019    Performed by Karie Chimera, MD at [facility] OR   ? INCISION AND DRAINAGE POSTOPERATIVE WOUND INFECTION - COMPLEX N/A 01/20/2019    Performed by Karie Chimera, MD at [facility] OR   ? CATARACT REMOVAL     ? CYSTOSCOPY     ? HX HIP SURGERY Bilateral    ? HX KNEE ARTHROSCOPY     ? HX KNEE SURGERY Bilateral    ? HX LAPAROTOMY      possible colon fistula   ? HX LYMPHADENECTOMY     ? HX ROTATOR CUFF REPAIR Bilateral [id number]   ? HX SHOULDER REPLACEMENT Bilateral    ? HX VASECTOMY     ? THYROIDECTOMY     ? UPPER GASTROINTESTINAL ENDOSCOPY       Family History   Problem Relation Age of Onset   ? Hypertension Mother    ? Cancer-Lung Mother 74   ? Melanoma Mother    ? Cancer Mother    ? Diabetes Mother    ? Heart Disease Mother    ? Hyperlipidemia Mother    ? Diabetes Father    ? Arthritis-osteo Sister    ? Hypertension Sister    ? Heart Disease Sister    ? Cancer Sister    ? Diabetes Sister    ? Arthritis Sister    ? Cancer Sister    ? Cancer-Lung Brother 80   ? Hyperlipidemia Brother    ? Hypertension Brother    ? Diabetes Brother    ? Arthritis Brother    ? Cancer-Colon Maternal Aunt    ? Cancer Maternal Uncle    ? Unknown to Patient Paternal Aunt    ? Unknown to Patient Paternal Uncle    ? Unknown to Patient Maternal Grandmother    ? Unknown to Patient Maternal Grandfather    ? Unknown to Patient Paternal Grandmother    ? Unknown to Patient Paternal Grandfather      Social History     Socioeconomic History   ? Marital status: Married   Tobacco Use   ? Smoking status: Never   ? Smokeless tobacco: Never   Vaping Use   ? Vaping Use: Never used   Substance and Sexual Activity   ? Alcohol use: Yes     Alcohol/week: 1.0 standard drink     Types: 1 Drinks containing 0.5 oz of alcohol per week   ? Drug use: No   ? Sexual activity: Yes     Partners: Female         Objective:     ? acetaminophen (TYLENOL) 500 mg tablet Take two tablets by mouth every 6 hours. Max of 4,000 mg of acetaminophen in 24 hours. (Patient taking differently: Take 650 mg by mouth every 6 hours as needed. Max of 4,000 mg of acetaminophen in 24 hours.)   ?  ascorbic acid 1,000 mg tablet Take one tablet by mouth daily.   ? aspirin 81 mg chewable tablet Chew one tablet by mouth daily. Take with food.   ? atorvastatin (LIPITOR) 20 mg tablet Take one tablet by mouth daily.   ? blood-glucose meter,continuous (DEXCOM G6 RECEIVER MISC)   Dexcom G6 Receiver, See Instructions, 1 EA, Use as directed., 0 Number of Refills, 0, Route to Pharmacy Electronically, [facility], [facility] (New Address), Instructions Replace Required Details, NCPDP_[id number], 185, cm, 04/25/20 11:38:00 CST, Height   ? blood-glucose sensor (DEXCOM G6 SENSOR MISC)   Dexcom G6 Sensor, See Instructions, 9 EA, Use as directed.  Change every 10 days., 3 Number of Refills, 3, Route to Pharmacy Electronically, [facility], [facility] (New Address), Instructions Replace Required Details, [id number]_[id number], 185, cm, 04/25/20...   ? blood-glucose transmitter (DEXCOM G6 TRANSMITTER MISC)   Dexcom G6 Transmitter, See Instructions, 1 EA, Use as directed., 3 Number of Refills, 3, Route to Pharmacy Electronically, [facility], [facility] (New Address), Instructions Replace Required Details, [id number]_[id number], 185, cm, 04/25/20 11:38:00 CST, Height   ? buPROPion XL (WELLBUTRIN XL) 300 mg tablet Take one tablet by mouth daily.   ? CHOLEcalciferoL (vitamin D3) (VITAMIN D3) 125 mcg (5,000 unit) capsule Take one capsule by mouth daily.     ? diclofenac sodium (VOLTAREN) 1 % topical gel    ? doxycycline hyclate (VIBRAMYCIN) 100 mg capsule Take one capsule by mouth twice daily.   ? dulaglutide (TRULICITY) 0.75 mg/0.5 mL injection pen Inject 0.5 mL under the skin every 7 days.   ? dutasteride (AVODART) 0.5 mg capsule Take 1 Cap by mouth daily.   ? empagliflozin (JARDIANCE) 25 mg tablet Take one tablet by mouth daily.   ? Fluocinolone 0.01 % oil Apply  topically to affected area.   ? gabapentin (NEURONTIN) 600 mg tablet Take two tablets by mouth twice daily.   ? hydrocortisone 1 % topical cream Apply  topically to affected area twice daily.   ? insulin lispro (U-100) (HUMALOG KWIKPEN INSULIN) 100 unit/mL subcutaneous PEN Inject sixteen Units under the skin three times daily with meals.   ? levothyroxine (SYNTHROID) 300 mcg tablet Take one tablet by mouth daily 30 minutes before breakfast.   ? meclizine (ANTIVERT) 25 mg tablet Take one tablet by mouth three times daily as needed. Indications: nausea   ? MELATONIN PO Take 5 mg by mouth at bedtime daily.   ? metoprolol tartrate 25 mg tablet Take one tablet by mouth twice daily.   ? omeprazole DR(+) (PRILOSEC) 40 mg capsule Take one capsule by mouth daily.   ? ondansetron (ZOFRAN ODT) 8 mg rapid dissolve tablet Dissolve one tablet by mouth as Needed.   ? oxybutynin XL (DITROPAN XL) 15 mg tablet daily.   ? oxyCODONE/acetaminophen (PERCOCET) 5/325 mg tablet Take one tablet by mouth every 8 hours as needed for Pain.   ? pantoprazole DR (PROTONIX) 40 mg tablet    ? senna/docusate (SENOKOT-S) 8.6/50 mg tablet Take one tablet by mouth daily. (Patient taking differently: Take two tablets by mouth twice daily.)   ? sertraline (ZOLOFT) 25 mg tablet Take one tablet by mouth daily.   ? tamsulosin (FLOMAX) 0.4 mg capsule Take one capsule by mouth daily. Do not crush, chew or open capsules. Take 30 minutes following the same meal each day.   ? vitamins, multiple tablet Take one tablet by mouth daily.     ? ZETIA 10 mg tablet TAKE ONE TABLET BY MOUTH ONCE DAILY  Vitals:     There is no height or weight on file to calculate BMI.     Physical Exam  Vitals and nursing note reviewed.   Constitutional:       General: He is not in acute distress.     Appearance: Normal appearance. He is not ill-appearing, toxic-appearing or diaphoretic.   HENT:      Head: Normocephalic and atraumatic.      Nose: Nose normal.   Eyes:      Conjunctiva/sclera: Conjunctivae normal.   Pulmonary:      Effort: Pulmonary effort is normal. No respiratory distress.   Abdominal:      General: Abdomen is flat.   Musculoskeletal:         General: No swelling. Normal range of motion.      Cervical back: Normal range of motion.   Skin:     Coloration: Skin is not jaundiced.   Neurological:      Mental Status: He is alert and oriented to person, place, and time.   Psychiatric:         Mood and Affect: Mood normal.               Lab   Thyroid Studies Lab Results   Component Value Date/Time    TSH 9.27 (H) 03/30/2021 09:58 AM    No results found for: Edsel Petrin, THYBINDGLB         Assessment and Plan:    Jimmy Waters was seen today for hypothyroidism and cancer surveillance.    Diagnoses and all orders for this visit:    Type 2 diabetes mellitus with hyperglycemia, with long-term current use of insulin (HCC)    Hypothyroidism due to acquired atrophy of thyroid    Other orders  -     dulaglutide (TRULICITY) 0.75 mg/0.5 mL injection pen; Inject 0.5 mL under the skin every 7 days.      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    Plan for surgery.  Goal TSH to be suppressed.      Pathology indicates Hurthle cell carcinoma.  Likely plan another RAI dose after surgery.    Continue levothyroxine 300 mcg daily for now.  TSH again in March.    Uncontrolled type 2 diabetes: Postprandial hyperglycemia.  He has nausea with Xultophy.    Continue Jardiance daily.  Increase Humalog 24 units with meals.  Humalog or Novolog:    If blood sugar 151-200: Take extra 2 unit.  If blood sugar 201-250: Take extra 4 units.  If blood sugar 251-300: Take extra 6 units.  If blood sugar is 301-350: Take extra 8 units.  If blood sugars > 350: Take extra 10 units.    Increase Lantus 40 units daily.    Start Trulicity 0.75 mg weekly.  This medicine belongs to class of medicine called GLP-1 class.  Medicine can cause nausea, vomiting, fullness in stomach.  Rare episode of pancreatitis has happened with this class of medicine.          40 minutes spent connecting via Zoom, been Doximity with the patient.  Reviewing ENT notes.  Pathology records.  Discussing diabetes and making plan for him, documentation.    Electronically signed by Lucrezia Starch, MD 05/12/2021

## 2021-05-22 ENCOUNTER — Encounter: Admit: 2021-05-22 | Discharge: 2021-05-22 | Payer: MEDICARE

## 2021-05-24 ENCOUNTER — Encounter: Admit: 2021-05-24 | Discharge: 2021-05-24 | Payer: MEDICARE

## 2021-05-24 NOTE — Telephone Encounter
Contacted pt's wife regarding pt's IV antibiotics and how he is feeling. After confirming pt's name/DOB, pt's wife states that pt is scheduled for 12 doses of outpatient IV antibiotics with last dose scheduled for 05/30/21. Pt's wife states also on that day, 05/30/21, pt has pre-anesthesia and PCP appointment and then will follow up with the infectious disease doctor on 05/31/21. Pt's wife states that after the IV antibiotics are complete, it is expected that pt will start on oral antibiotics long term. Pt's wife state that pt now has a nephrostomy tube and is starting to feel better and is getting up and about but is having some weakness. Informed pt's wife that this RN will update Dr. Loletta Parish. Pt's wife verbalizes understanding and states she will inform this office about the opinions of providers whose appointments are upcoming. Denies further needs/requests.

## 2021-05-25 ENCOUNTER — Encounter: Admit: 2021-05-25 | Discharge: 2021-05-25 | Payer: MEDICARE

## 2021-05-25 NOTE — Telephone Encounter
Contacted pt regarding upcoming surgery. After confirming pt name/DOB, informed pt that Dr. Loletta Parish thinks it would be for the best to postpone surgery until 07/06/21 d/t recent history of MRSA of blood and urine and being on IV antibiotics to give his body time to recover. Pt states he is feeling fine and is looking forward to surgery on 06/07/21 and prefers to keep that date. Pt states that he has had 3 family members die of cancer and it is important to him to have this surgery as soon as possible and that he has tried to do everything he can to avoid having to postpone surgery. Assured pt that Dr. Loletta Parish wants what is best and safest for pt. Pt still prefers to keep surgery date. Informed pt will speak to Dr. Loletta Parish today and call him back but that the plan may likely remain to postpone until April 13th. Pt verbalizes understanding. Denies needs/requests.

## 2021-05-30 ENCOUNTER — Encounter: Admit: 2021-05-30 | Discharge: 2021-05-30 | Payer: MEDICARE

## 2021-05-30 ENCOUNTER — Ambulatory Visit: Admit: 2021-05-30 | Discharge: 2021-05-30 | Payer: MEDICARE

## 2021-05-30 DIAGNOSIS — K3184 Gastroparesis: Secondary | ICD-10-CM

## 2021-05-30 DIAGNOSIS — E119 Type 2 diabetes mellitus without complications: Secondary | ICD-10-CM

## 2021-05-30 DIAGNOSIS — M545 Chronic lower back pain: Secondary | ICD-10-CM

## 2021-05-30 DIAGNOSIS — E039 Hypothyroidism, unspecified: Secondary | ICD-10-CM

## 2021-05-30 DIAGNOSIS — Z8719 Personal history of other diseases of the digestive system: Secondary | ICD-10-CM

## 2021-05-30 DIAGNOSIS — N400 Enlarged prostate without lower urinary tract symptoms: Secondary | ICD-10-CM

## 2021-05-30 DIAGNOSIS — E785 Hyperlipidemia, unspecified: Secondary | ICD-10-CM

## 2021-05-30 DIAGNOSIS — I10 Essential (primary) hypertension: Secondary | ICD-10-CM

## 2021-05-30 DIAGNOSIS — IMO0002 Squamous cell carcinoma: Secondary | ICD-10-CM

## 2021-05-30 DIAGNOSIS — K319 Disease of stomach and duodenum, unspecified: Secondary | ICD-10-CM

## 2021-05-30 DIAGNOSIS — N62 Hypertrophy of breast: Secondary | ICD-10-CM

## 2021-05-30 DIAGNOSIS — C4490 Unspecified malignant neoplasm of skin, unspecified: Secondary | ICD-10-CM

## 2021-05-30 DIAGNOSIS — F419 Anxiety disorder, unspecified: Secondary | ICD-10-CM

## 2021-05-30 DIAGNOSIS — Z01818 Encounter for other preprocedural examination: Secondary | ICD-10-CM

## 2021-05-30 DIAGNOSIS — A498 Other bacterial infections of unspecified site: Secondary | ICD-10-CM

## 2021-05-30 DIAGNOSIS — K2270 Barrett's esophagus without dysplasia: Secondary | ICD-10-CM

## 2021-05-30 DIAGNOSIS — C73 Malignant neoplasm of thyroid gland: Secondary | ICD-10-CM

## 2021-05-30 DIAGNOSIS — N200 Calculus of kidney: Secondary | ICD-10-CM

## 2021-05-30 DIAGNOSIS — G629 Polyneuropathy, unspecified: Secondary | ICD-10-CM

## 2021-05-30 DIAGNOSIS — K219 Gastro-esophageal reflux disease without esophagitis: Secondary | ICD-10-CM

## 2021-05-30 DIAGNOSIS — M549 Dorsalgia, unspecified: Secondary | ICD-10-CM

## 2021-05-30 DIAGNOSIS — K5732 Diverticulitis of large intestine without perforation or abscess without bleeding: Secondary | ICD-10-CM

## 2021-05-30 DIAGNOSIS — N390 Urinary tract infection, site not specified: Secondary | ICD-10-CM

## 2021-05-30 DIAGNOSIS — Z9889 Other specified postprocedural states: Secondary | ICD-10-CM

## 2021-05-30 DIAGNOSIS — B999 Unspecified infectious disease: Secondary | ICD-10-CM

## 2021-05-30 DIAGNOSIS — M1990 Unspecified osteoarthritis, unspecified site: Secondary | ICD-10-CM

## 2021-06-06 ENCOUNTER — Encounter: Admit: 2021-06-06 | Discharge: 2021-06-06 | Payer: MEDICARE

## 2021-06-06 NOTE — Telephone Encounter
Returned phone call to pt. After confirming pt name/DOB, explained to pt that Dr. Loletta Parish wants him to take a covid test and let us know the results. Pt states he has already tested today and it was negative. Pt states his son will be driving him to the hospital and will be picking him up the next day. Informed pt that since his test was negative he can proceed with surgery tomorrow. Pt verbalizes understanding. Denies further needs/requests.

## 2021-06-06 NOTE — Telephone Encounter
Received phone call from patient stating he was exposed to covid and wants to know if this will affect surgery tomorrow. Pt states he has not had any symptoms or any fever. Pt states his wife tested positive on Saturday but she has been isolating herself from him and wearing a mask. Informed pt that this RN will let Dr. Loletta Parish know and get back to him. Pt verbalizes understanding. Denies further needs/requests.

## 2021-06-07 ENCOUNTER — Encounter: Admit: 2021-06-07 | Discharge: 2021-06-07 | Payer: MEDICARE

## 2021-06-08 ENCOUNTER — Encounter: Admit: 2021-06-08 | Discharge: 2021-06-08 | Payer: MEDICARE

## 2021-06-12 ENCOUNTER — Encounter: Admit: 2021-06-12 | Discharge: 2021-06-12 | Payer: MEDICARE

## 2021-06-13 ENCOUNTER — Encounter: Admit: 2021-06-13 | Discharge: 2021-06-13 | Payer: MEDICARE

## 2021-06-13 NOTE — Telephone Encounter
Patient's wife calling to discuss a surgery date. She can be reached at [phone]

## 2021-06-15 ENCOUNTER — Encounter: Admit: 2021-06-15 | Discharge: 2021-06-15 | Payer: MEDICARE

## 2021-06-15 NOTE — Telephone Encounter
LVM regarding new surgery date, name/call back number provided to pt.

## 2021-06-15 NOTE — Telephone Encounter
Pt's wife left VM returning call from [place] about new surgery date. Did not provide specifics about reason for return call. Pt's wife can be reached at [phone].

## 2021-06-26 ENCOUNTER — Encounter: Admit: 2021-06-26 | Discharge: 2021-06-26 | Payer: MEDICARE

## 2021-07-11 ENCOUNTER — Encounter: Admit: 2021-07-11 | Discharge: 2021-07-11 | Payer: MEDICARE

## 2021-07-11 DIAGNOSIS — E1165 Type 2 diabetes mellitus with hyperglycemia: Secondary | ICD-10-CM

## 2021-07-11 NOTE — Progress Notes
CGM note    Patient's Personal continuous glucose monitor [employer] reviewed.  At least 72 hours of data reviewed and interpreted.  Data interpreted 07/11/2021  Data interpreted from 3/20 to 07/11/21    Findings:   Average glucose 179  GMI   Glucose variability 42  Target glucose 50%  High 45%  Very High 4%  Low glucose <1%    Interpretation hyperglycemia, mainly fasting    Continuous glucose monitor report scanned in epic.    Electronically signed by Jacqlyn Krauss, MD 07/11/2021

## 2021-07-11 NOTE — Telephone Encounter
Patient recently got discharged from [facility] states patient is in donut hole and cannot afford Trulicity or Jardiance.  Would like to know alternatives.  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  Patient has not taken Trulicity for three weeks.  CGM scanned into chart for Dr. Gustavus Messing to review.   Routing to Dr. Gustavus Messing

## 2021-07-12 ENCOUNTER — Encounter: Admit: 2021-07-12 | Discharge: 2021-07-12 | Payer: MEDICARE

## 2021-07-12 ENCOUNTER — Ambulatory Visit: Admit: 2021-07-12 | Discharge: 2021-07-12 | Payer: MEDICARE

## 2021-07-12 DIAGNOSIS — IMO0002 Squamous cell carcinoma: Secondary | ICD-10-CM

## 2021-07-12 DIAGNOSIS — M545 Chronic lower back pain: Secondary | ICD-10-CM

## 2021-07-12 DIAGNOSIS — E890 Postprocedural hypothyroidism: Secondary | ICD-10-CM

## 2021-07-12 DIAGNOSIS — N529 Male erectile dysfunction, unspecified: Secondary | ICD-10-CM

## 2021-07-12 DIAGNOSIS — B999 Unspecified infectious disease: Secondary | ICD-10-CM

## 2021-07-12 DIAGNOSIS — E119 Type 2 diabetes mellitus without complications: Secondary | ICD-10-CM

## 2021-07-12 DIAGNOSIS — C73 Malignant neoplasm of thyroid gland: Secondary | ICD-10-CM

## 2021-07-12 DIAGNOSIS — K5732 Diverticulitis of large intestine without perforation or abscess without bleeding: Secondary | ICD-10-CM

## 2021-07-12 DIAGNOSIS — E785 Hyperlipidemia, unspecified: Secondary | ICD-10-CM

## 2021-07-12 DIAGNOSIS — N390 Urinary tract infection, site not specified: Secondary | ICD-10-CM

## 2021-07-12 DIAGNOSIS — G629 Polyneuropathy, unspecified: Secondary | ICD-10-CM

## 2021-07-12 DIAGNOSIS — N62 Hypertrophy of breast: Secondary | ICD-10-CM

## 2021-07-12 DIAGNOSIS — E1165 Type 2 diabetes mellitus with hyperglycemia: Secondary | ICD-10-CM

## 2021-07-12 DIAGNOSIS — N400 Enlarged prostate without lower urinary tract symptoms: Secondary | ICD-10-CM

## 2021-07-12 DIAGNOSIS — I10 Essential (primary) hypertension: Secondary | ICD-10-CM

## 2021-07-12 DIAGNOSIS — F419 Anxiety disorder, unspecified: Secondary | ICD-10-CM

## 2021-07-12 DIAGNOSIS — M1990 Unspecified osteoarthritis, unspecified site: Secondary | ICD-10-CM

## 2021-07-12 DIAGNOSIS — K319 Disease of stomach and duodenum, unspecified: Secondary | ICD-10-CM

## 2021-07-12 DIAGNOSIS — W19XXXA Unspecified fall, initial encounter: Secondary | ICD-10-CM

## 2021-07-12 DIAGNOSIS — E3120 Multiple endocrine neoplasia [MEN] syndrome, unspecified: Secondary | ICD-10-CM

## 2021-07-12 DIAGNOSIS — E041 Nontoxic single thyroid nodule: Secondary | ICD-10-CM

## 2021-07-12 DIAGNOSIS — R519 Generalized headaches: Secondary | ICD-10-CM

## 2021-07-12 DIAGNOSIS — R2689 Other abnormalities of gait and mobility: Secondary | ICD-10-CM

## 2021-07-12 DIAGNOSIS — C4490 Unspecified malignant neoplasm of skin, unspecified: Secondary | ICD-10-CM

## 2021-07-12 DIAGNOSIS — K219 Gastro-esophageal reflux disease without esophagitis: Secondary | ICD-10-CM

## 2021-07-12 DIAGNOSIS — N200 Calculus of kidney: Secondary | ICD-10-CM

## 2021-07-12 DIAGNOSIS — E039 Hypothyroidism, unspecified: Secondary | ICD-10-CM

## 2021-07-12 DIAGNOSIS — M549 Dorsalgia, unspecified: Secondary | ICD-10-CM

## 2021-07-12 DIAGNOSIS — K2270 Barrett's esophagus without dysplasia: Secondary | ICD-10-CM

## 2021-07-12 DIAGNOSIS — A498 Other bacterial infections of unspecified site: Secondary | ICD-10-CM

## 2021-07-12 DIAGNOSIS — Z9889 Other specified postprocedural states: Secondary | ICD-10-CM

## 2021-07-12 DIAGNOSIS — N189 Chronic kidney disease, unspecified: Secondary | ICD-10-CM

## 2021-07-12 DIAGNOSIS — K3184 Gastroparesis: Secondary | ICD-10-CM

## 2021-07-12 DIAGNOSIS — D539 Nutritional anemia, unspecified: Secondary | ICD-10-CM

## 2021-07-12 DIAGNOSIS — Z8719 Personal history of other diseases of the digestive system: Secondary | ICD-10-CM

## 2021-07-12 LAB — THYROGLOBULIN & THYROGLOBULIN AB
ANTI THYROGLOB ANTI: 16 [IU]/mL (ref <116)
THYROGLOBULIN: 85 ng/mL — ABNORMAL HIGH (ref 1.7–55.0)

## 2021-07-12 LAB — TSH WITH FREE T4 REFLEX: TSH: 1.4 uU/mL (ref 0.35–5.00)

## 2021-07-12 NOTE — Patient Instructions
Continue Jardiance daily.  Decrease Humalog 20 units with meals.  Humalog or Novolog:  If blood sugar 151-200: Take extra 2 unit.  If blood sugar 201-250: Take extra 4 units.  If blood sugar 251-300: Take extra 6 units.  If blood sugar is 301-350: Take extra 8 units.  If blood sugars > 350: Take extra 10 units.    Decrease Lantus to 30 units daily.    Start Trulicity 3.42 mg weekly. If tolerating well then increase to 1.5 mg.

## 2021-07-12 NOTE — Progress Notes
Chief Complaint   Patient presents with   ? Diabetes        Date of Service: 07/12/2021    Note to patient: The 21st Century Cures Act makes medical notes like these available to patients in the interest of transparency. However, be advised this is a medical document. It is intended as peer to peer communication. It is written in medical language and may contain abbreviations or verbiage that are unfamiliar. It may appear blunt or direct. Medical documents are intended to carry relevant information, facts as evident, and the clinical opinion of the practitioner.    Jimmy Waters is a 66 y.o. male. DOB: October 10, 1955   MRN#: [id number]    HPI:  Seen today for thyroid cancer.  Reviewed oncology notes.  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  Jan 2018 - RAI?154 mCi?Post treatment scan showed some uptake on thyroid bed and mediastinal nodes.  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 She removed a nodule near his thyroidectomy scar. ?Pathology confirmed metastatic Papillary Thyroid Cancer.  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  Plan for surgery by ENT in May.  ?  ?  07/13/2020?CT Neck w/contrast?[facility]  1. No CT evidence of a suspicious neck mass or suspicious lymphadenopathy by CT size criteria.  2. Symmetric prominence of bilateral false and true vocal cores at the level of the hyoid bone and correlate with clinical exam.  ?  He is on levothyroxine 300 mcg daily.     He has Type 2 DM for many years.  Current treatment:  Jardiance daily.  Humalog 18 units with meals.  Lantus 40 units daily.  He has nausea with Xultophy.    He was on Trulicity in the past but it was stopped due to thyroid cancer diagnosis.  Trulicity is expensive.  He is not able to afford the medicine.  He was on Jardiance but it was stopped when he developed a kidney problem.  He has nausea off and on.    Glucose readings are elevated.    Review of Systems   Constitutional: Negative for fever.   Cardiovascular: Negative for chest pain.       Medical History:   Diagnosis Date   ? Anxiety disorder    ? Back pain    ? Barrett esophagus    ? BPH (benign prostatic hyperplasia)    ? Cancer of skin    ? Cataract    ? Chronic kidney disease unknown   ? Chronic lower back pain    ? Chronic UTI    ? Degenerative arthritis    ? Depression (disease)    ? Diverticulitis of colon (without mention of hemorrhage)(562.11)     type 2   ? DJD (degenerative joint disease)    ? DM type 2 (diabetes mellitus, type 2) (HCC)    ? Dyslipidemia    ? Erectile dysfunction 2021   ? Falls 2022   ? Gastroparesis    ? Generalized headaches 2022   ? GERD (gastroesophageal reflux disease)    ? Gynecomastia    ? History of dental problems     lower partial   ? History of Nissen fundoplication ~  1990   ? HLD (hyperlipidemia)    ? HTN (hypertension)    ? Hx of arthroscopy of knee    ? Hypothyroidism    ? Infection    ? Kidney stones    ? 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 Neuropathy    ? Osteoarthritis    ? Poor balance 2022   ? Shiga toxin-producing Escherichia coli infection    ? Skin cancer    ? Squamous cell carcinoma    ? Stomach disorder    ? Thyroid cancer (HCC) 2009   ? Thyroid nodule 2009, 2017, 2022   ? Unspecified deficiency anemia 2022     Surgical History:   Procedure Laterality Date   ? HX LUMBAR FUSION  1982    L4-5   ? HX THYROIDECTOMY  09/2007   ? CYSTOURETHROSCOPY  2012   ? KIDNEY STONE SURGERY  2012   ? COLONOSCOPY  02/14/2011   ? HX APPENDECTOMY  02/2011   ? REPEAT LEFT LUMBAR 5 TO SACRAL 1 LAMINOTOMY N/A 01/02/2019    Performed by Karie Chimera, MD at [facility] OR   ? INCISION AND DRAINAGE POSTOPERATIVE WOUND INFECTION - COMPLEX N/A 01/20/2019    Performed by Karie Chimera, MD at [facility] OR   ? NEPHROSTOMY Right 04/2021    in place from right ureter from OSH   ? CATARACT REMOVAL WITH IMPLANT Bilateral    ? CYSTOSCOPY     ? HX HIP REPLACEMENT Bilateral     revision of 1 hip also   ? HX KNEE ARTHROSCOPY     ? HX KNEE REPLACMENT Bilateral    ? HX LAPAROTOMY      possible colon fistula   ? HX LYMPHADENECTOMY     ? HX ROTATOR CUFF REPAIR Bilateral     [id number]   ? HX SHOULDER REPLACEMENT Bilateral    ? HX TONSILLECTOMY     ? HX VASECTOMY     ? PARATHYROID GLAND SURGERY  2009   ? THYROIDECTOMY     ? UPPER GASTROINTESTINAL ENDOSCOPY       Family History   Problem Relation Age of Onset   ? Hypertension Mother    ? Cancer-Lung Mother 6   ? Melanoma Mother    ? Cancer Mother         lung   ? Diabetes Mother    ? Heart Disease Mother    ? Hyperlipidemia Mother    ? Thyroid Disease Mother    ? Diabetes Father    ? Arthritis-osteo Sister    ? Hypertension Sister    ? Heart Disease Sister    ? Cancer Sister         lymphoma   ? Diabetes Sister    ? Arthritis Sister    ? Cancer Sister    ? Cancer-Lung Brother 80   ? Hyperlipidemia Brother    ? Hypertension Brother    ? Diabetes Brother    ? Arthritis Brother    ? Cancer Brother         lung   ? Kidney Disease Brother    ? Cancer-Colon Maternal Aunt    ? Cancer Maternal Uncle    ? Unknown to Patient Paternal Aunt    ? Unknown to Patient Paternal Uncle    ? Unknown to Patient Maternal Grandmother    ? Unknown to Patient Maternal Grandfather    ? Unknown  to Patient Paternal Grandmother    ? Unknown to Patient Paternal Grandfather    ? Cancer Sister         esophaguse   ? Diabetes Sister    ? Diabetes Brother    ? Heart Disease Sister    ? Obesity Brother      Social History     Socioeconomic History   ? Marital status: Married   Tobacco Use   ? Smoking status: Never   ? Smokeless tobacco: Never   Vaping Use   ? Vaping Use: Never used   Substance and Sexual Activity   ? Alcohol use: Yes     Alcohol/week: 1.0 standard drink     Types: 1 Drinks containing 0.5 oz of alcohol per week     Comment: ~ once a month   ? Drug use: No   ? Sexual activity: Not Currently     Partners: Female     Birth control/protection: None         Objective:     ? acetaminophen (TYLENOL) 500 mg tablet Take two tablets by mouth every 6 hours. Max of 4,000 mg of acetaminophen in 24 hours. (Patient taking differently: Take 650 mg by mouth every 6 hours as needed. Max of 4,000 mg of acetaminophen in 24 hours.)   ? ascorbic acid 1,000 mg tablet Take one tablet by mouth daily.   ? aspirin 81 mg chewable tablet Chew one tablet by mouth daily. Take with food.   ? atorvastatin (LIPITOR) 20 mg tablet Take one tablet by mouth daily.   ? blood-glucose meter,continuous (DEXCOM G6 RECEIVER MISC)   Dexcom G6 Receiver, See Instructions, 1 EA, Use as directed., 0 Number of Refills, 0, Route to Pharmacy Electronically, [facility], [facility] (New Address), Instructions Replace Required Details, [id number]_[id number], 185, cm, 04/25/20 11:38:00 CST, Height   ? blood-glucose sensor (DEXCOM G6 SENSOR MISC)   Dexcom G6 Sensor, See Instructions, 9 EA, Use as directed.  Change every 10 days., 3 Number of Refills, 3, Route to Pharmacy Electronically, [facility], [facility] (New Address), Instructions Replace Required Details, [id number]_[id number], 185, cm, 04/25/20...   ? blood-glucose transmitter (DEXCOM G6 TRANSMITTER MISC)   Dexcom G6 Transmitter, See Instructions, 1 EA, Use as directed., 3 Number of Refills, 3, Route to Pharmacy Electronically, [facility], [facility] (New Address), Instructions Replace Required Details, [id number]_[id number], 185, cm, 04/25/20 11:38:00 CST, Height   ? buPROPion HCL SR (WELLBUTRIN SR) 150 mg tablet, 12 hr sustained-release Take one tablet by mouth twice daily.   ? CHOLEcalciferoL (vitamin D3) (VITAMIN D3) 125 mcg (5,000 unit) capsule Take one capsule by mouth daily.     ? diclofenac sodium (VOLTAREN) 1 % topical gel Apply topically to affected area as Needed.   ? dulaglutide (TRULICITY) 0.75 mg/0.5 mL injection pen Inject 0.5 mL under the skin every 7 days. (Patient taking differently: Inject 0.5 mL under the skin every Sunday.)   ? dutasteride (AVODART) 0.5 mg capsule Take 1 Cap by mouth daily. (Patient taking differently: Take one capsule by mouth at bedtime daily.)   ? empagliflozin (JARDIANCE) 25 mg tablet Take one tablet by mouth daily.   ? Fluocinolone 0.01 % oil Apply  topically to affected area as Needed.   ? gabapentin (NEURONTIN) 600 mg tablet Take two tablets by mouth twice daily.   ? hydrocortisone 1 % topical cream Apply  topically to affected area as Needed.   ? insulin glargine (LANTUS SOLOSTAR U-100 INSULIN) 100 unit/mL (3 mL) subcutaneous PEN Inject  forty Units under the skin at bedtime daily.   ? insulin lispro (U-100) (HUMALOG KWIKPEN INSULIN) 100 unit/mL subcutaneous PEN Inject twenty four Units under the skin three times daily with meals.   ? levothyroxine (SYNTHROID) 300 mcg tablet Take one tablet by mouth daily 30 minutes before breakfast.   ? meclizine (ANTIVERT) 25 mg tablet Take one tablet by mouth three times daily as needed. Indications: nausea   ? Melatonin 5 mg cap Take one capsule by mouth at bedtime daily.   ? metoprolol tartrate 25 mg tablet Take one tablet by mouth twice daily.   ? ondansetron (ZOFRAN ODT) 8 mg rapid dissolve tablet Dissolve one tablet by mouth as Needed.   ? oxybutynin XL (DITROPAN XL) 15 mg tablet Take one tablet by mouth daily.   ? pantoprazole DR (PROTONIX) 40 mg tablet Take one tablet by mouth daily.   ? sertraline (ZOLOFT) 50 mg tablet Take one tablet by mouth daily.   ? tamsulosin (FLOMAX) 0.4 mg capsule Take one capsule by mouth daily. Do not crush, chew or open capsules. Take 30 minutes following the same meal each day.   ? vitamins, multiple tablet Take one tablet by mouth daily.       Vitals:    07/12/21 1135   BP: 127/67   BP Source: Arm, Right Upper   Pulse: 74   Temp: 36.5 ?C (97.7 ?F)   Resp: 16   SpO2: 99%   TempSrc: Temporal   PainSc: Seven   Weight: 108.9 kg (240 lb)   Height: 182.9 cm (6')     Body mass index is 32.55 kg/m?Marland Kitchen     Physical Exam  Vitals and nursing note reviewed.   Constitutional:       General: He is not in acute distress.     Appearance: Normal appearance. He is not ill-appearing, toxic-appearing or diaphoretic.   HENT:      Head: Normocephalic and atraumatic.      Nose: Nose normal.   Eyes:      Conjunctiva/sclera: Conjunctivae normal.   Pulmonary:      Effort: Pulmonary effort is normal. No respiratory distress.   Abdominal:      General: Abdomen is flat.   Musculoskeletal:         General: No swelling. Normal range of motion.      Cervical back: Normal range of motion.   Skin:     Coloration: Skin is not jaundiced.   Neurological:      Mental Status: He is alert and oriented to person, place, and time.   Psychiatric:         Mood and Affect: Mood normal.               Lab   Thyroid Studies    Lab Results   Component Value Date/Time    TSH 9.27 (H) 03/30/2021 09:58 AM    No results found for: Edsel Petrin, THYBINDGLB         Assessment and Plan:    Caralyn Guile. Pavlock was seen today for diabetes.    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  Goal TSH to be suppressed.      Pathology indicates Hurthle cell carcinoma.  Plan for surgery early May.    Continue levothyroxine 300 mcg daily for now.  TSH again in March.    Uncontrolled type 2 diabetes: Postprandial hyperglycemia.  He has nausea with Xultophy.    Continue Jardiance daily.  Decrease Humalog 20 units with meals.  Humalog or Novolog:  If blood sugar 151-200: Take extra 2 unit.  If blood sugar 201-250: Take extra 4 units.  If blood sugar 251-300: Take extra 6 units.  If blood sugar is 301-350: Take extra 8 units.  If blood sugars > 350: Take extra 10 units.    Decrease Lantus to 30 units daily.    Start Trulicity 0.75 mg weekly. If tolerating well then increase to 1.5 mg.  I gave him samples today.  This medicine belongs to class of medicine called GLP-1 class.  Medicine can cause nausea, vomiting, fullness in stomach.  Rare episode of pancreatitis has happened with this class of medicine.          40 minutes spent Discussing diabetes with the patient and wife, and making plan for him, documentation.    Electronically signed by Lucrezia Starch, MD 07/12/2021

## 2021-07-13 ENCOUNTER — Encounter: Admit: 2021-07-13 | Discharge: 2021-07-13 | Payer: MEDICARE

## 2021-07-13 MED ORDER — LEVOTHYROXINE 25 MCG PO TAB
25 ug | ORAL_TABLET | Freq: Every day | ORAL | 3 refills | 30.00000 days | Status: AC
Start: 2021-07-13 — End: ?

## 2021-07-23 ENCOUNTER — Encounter: Admit: 2021-07-23 | Discharge: 2021-07-23 | Payer: MEDICARE

## 2021-07-23 ENCOUNTER — Emergency Department: Admit: 2021-07-23 | Discharge: 2021-07-23 | Payer: MEDICARE

## 2021-07-23 ENCOUNTER — Inpatient Hospital Stay: Admit: 2021-07-23 | Payer: MEDICARE

## 2021-07-23 DIAGNOSIS — N62 Hypertrophy of breast: Secondary | ICD-10-CM

## 2021-07-23 DIAGNOSIS — D539 Nutritional anemia, unspecified: Secondary | ICD-10-CM

## 2021-07-23 DIAGNOSIS — E041 Nontoxic single thyroid nodule: Secondary | ICD-10-CM

## 2021-07-23 DIAGNOSIS — K3184 Gastroparesis: Secondary | ICD-10-CM

## 2021-07-23 DIAGNOSIS — N529 Male erectile dysfunction, unspecified: Secondary | ICD-10-CM

## 2021-07-23 DIAGNOSIS — E119 Type 2 diabetes mellitus without complications: Secondary | ICD-10-CM

## 2021-07-23 DIAGNOSIS — R519 Generalized headaches: Secondary | ICD-10-CM

## 2021-07-23 DIAGNOSIS — E785 Hyperlipidemia, unspecified: Secondary | ICD-10-CM

## 2021-07-23 DIAGNOSIS — E3120 Multiple endocrine neoplasia [MEN] syndrome, unspecified: Secondary | ICD-10-CM

## 2021-07-23 DIAGNOSIS — N189 Chronic kidney disease, unspecified: Secondary | ICD-10-CM

## 2021-07-23 DIAGNOSIS — IMO0002 Squamous cell carcinoma: Secondary | ICD-10-CM

## 2021-07-23 DIAGNOSIS — I10 Essential (primary) hypertension: Secondary | ICD-10-CM

## 2021-07-23 DIAGNOSIS — A498 Other bacterial infections of unspecified site: Secondary | ICD-10-CM

## 2021-07-23 DIAGNOSIS — M1990 Unspecified osteoarthritis, unspecified site: Secondary | ICD-10-CM

## 2021-07-23 DIAGNOSIS — Z8719 Personal history of other diseases of the digestive system: Secondary | ICD-10-CM

## 2021-07-23 DIAGNOSIS — K2270 Barrett's esophagus without dysplasia: Secondary | ICD-10-CM

## 2021-07-23 DIAGNOSIS — N390 Urinary tract infection, site not specified: Secondary | ICD-10-CM

## 2021-07-23 DIAGNOSIS — M549 Dorsalgia, unspecified: Secondary | ICD-10-CM

## 2021-07-23 DIAGNOSIS — B999 Unspecified infectious disease: Secondary | ICD-10-CM

## 2021-07-23 DIAGNOSIS — F419 Anxiety disorder, unspecified: Secondary | ICD-10-CM

## 2021-07-23 DIAGNOSIS — C4490 Unspecified malignant neoplasm of skin, unspecified: Secondary | ICD-10-CM

## 2021-07-23 DIAGNOSIS — E039 Hypothyroidism, unspecified: Secondary | ICD-10-CM

## 2021-07-23 DIAGNOSIS — K5732 Diverticulitis of large intestine without perforation or abscess without bleeding: Secondary | ICD-10-CM

## 2021-07-23 DIAGNOSIS — N200 Calculus of kidney: Secondary | ICD-10-CM

## 2021-07-23 DIAGNOSIS — C73 Malignant neoplasm of thyroid gland: Secondary | ICD-10-CM

## 2021-07-23 DIAGNOSIS — K219 Gastro-esophageal reflux disease without esophagitis: Secondary | ICD-10-CM

## 2021-07-23 DIAGNOSIS — Z9889 Other specified postprocedural states: Secondary | ICD-10-CM

## 2021-07-23 DIAGNOSIS — R2689 Other abnormalities of gait and mobility: Secondary | ICD-10-CM

## 2021-07-23 DIAGNOSIS — G629 Polyneuropathy, unspecified: Secondary | ICD-10-CM

## 2021-07-23 DIAGNOSIS — N400 Enlarged prostate without lower urinary tract symptoms: Secondary | ICD-10-CM

## 2021-07-23 DIAGNOSIS — M545 Chronic lower back pain: Secondary | ICD-10-CM

## 2021-07-23 DIAGNOSIS — K319 Disease of stomach and duodenum, unspecified: Secondary | ICD-10-CM

## 2021-07-23 DIAGNOSIS — W19XXXA Unspecified fall, initial encounter: Secondary | ICD-10-CM

## 2021-07-23 LAB — COMPREHENSIVE METABOLIC PANEL
BLD UREA NITROGEN: 22 mg/dL (ref 7–25)
CHLORIDE: 105 MMOL/L — ABNORMAL LOW (ref 98–110)
CREATININE: 1.2 mg/dL (ref 0.4–1.24)
GLUCOSE,PANEL: 143 mg/dL — ABNORMAL HIGH (ref 70–100)
POTASSIUM: 4 MMOL/L — ABNORMAL LOW (ref 3.5–5.1)
SODIUM: 140 MMOL/L — ABNORMAL LOW (ref 137–147)

## 2021-07-23 LAB — URINALYSIS DIPSTICK REFLEX TO CULTURE
NITRITE: POSITIVE K/UL — AB (ref 0–0.80)
URINE ASCORBIC ACID, UA: NEGATIVE K/UL (ref 0–0.20)
URINE BILE: NEGATIVE U/L (ref 7–56)

## 2021-07-23 LAB — CBC AND DIFF
MDW (MONOCYTE DISTRIBUTION WIDTH): 20 — ABNORMAL HIGH (ref <20.7)
WBC COUNT: 8.7 K/UL (ref 4.5–11.0)

## 2021-07-23 LAB — URINALYSIS MICROSCOPIC REFLEX TO CULTURE

## 2021-07-23 LAB — POC GLUCOSE
POC GLUCOSE: 79 mg/dL (ref 70–100)
POC GLUCOSE: 167 mg/dL — ABNORMAL HIGH (ref 70–100)

## 2021-07-23 LAB — PROTIME INR (PT)
INR: 1.1 (ref 0.8–1.2)
PROTIME: 12 s (ref 9.5–14.2)

## 2021-07-23 LAB — POC LACTATE: LACTIC ACID POC: 1.4 MMOL/L (ref 0.5–2.0)

## 2021-07-23 LAB — POC CREATININE, RAD: CREATININE, POC: 1.2 mg/dL (ref 0.4–1.24)

## 2021-07-23 MED ORDER — OXYCODONE 5 MG PO TAB
5 mg | Freq: Once | ORAL | 0 refills | Status: CP
Start: 2021-07-23 — End: ?
  Administered 2021-07-23: 18:00:00 5 mg via ORAL

## 2021-07-23 MED ORDER — PIPERACILLIN/TAZOBACTAM 4.5 G/100ML NS IVPB (MB+)
4.5 g | Freq: Once | INTRAVENOUS | 0 refills | Status: DC
Start: 2021-07-23 — End: 2021-07-23

## 2021-07-23 MED ORDER — TAMSULOSIN 0.4 MG PO CAP
.4 mg | Freq: Every day | ORAL | 0 refills | Status: AC
Start: 2021-07-23 — End: ?
  Administered 2021-07-24 – 2021-08-01 (×8): 0.4 mg via ORAL

## 2021-07-23 MED ORDER — MELATONIN 5 MG PO TAB
5 mg | Freq: Every evening | ORAL | 0 refills | Status: AC | PRN
Start: 2021-07-23 — End: ?
  Administered 2021-07-24 – 2021-07-25 (×2): 5 mg via ORAL

## 2021-07-23 MED ORDER — METOPROLOL TARTRATE 50 MG PO TAB
50 mg | Freq: Two times a day (BID) | ORAL | 0 refills | Status: AC
Start: 2021-07-23 — End: ?
  Administered 2021-07-24 – 2021-08-01 (×16): 50 mg via ORAL

## 2021-07-23 MED ORDER — ONDANSETRON 4 MG PO TBDI
4 mg | ORAL | 0 refills | Status: AC | PRN
Start: 2021-07-23 — End: ?
  Administered 2021-07-24: 13:00:00 4 mg via ORAL

## 2021-07-23 MED ORDER — OXYBUTYNIN CHLORIDE 15 MG PO TR24
15 mg | Freq: Every day | ORAL | 0 refills | Status: AC
Start: 2021-07-23 — End: ?
  Administered 2021-07-26 – 2021-08-01 (×10): 15 mg via ORAL

## 2021-07-23 MED ORDER — SODIUM CHLORIDE 0.9 % IJ SOLN
50 mL | Freq: Once | INTRAVENOUS | 0 refills | Status: CP
Start: 2021-07-23 — End: ?
  Administered 2021-07-23: 21:00:00 50 mL via INTRAVENOUS

## 2021-07-23 MED ORDER — ACETAMINOPHEN 325 MG PO TAB
650 mg | ORAL | 0 refills | Status: AC | PRN
Start: 2021-07-23 — End: ?
  Administered 2021-07-24 (×2): 650 mg via ORAL

## 2021-07-23 MED ORDER — INSULIN ASPART 100 UNIT/ML SC FLEXPEN
0-6 [IU] | Freq: Before meals | SUBCUTANEOUS | 0 refills | Status: AC
Start: 2021-07-23 — End: ?
  Administered 2021-07-31: 03:00:00 1 [IU] via SUBCUTANEOUS

## 2021-07-23 MED ORDER — BUPROPION XL 300 MG PO TB24
300 mg | Freq: Every day | ORAL | 0 refills | Status: AC
Start: 2021-07-23 — End: ?
  Administered 2021-07-24 – 2021-07-28 (×4): 300 mg via ORAL

## 2021-07-23 MED ORDER — PANTOPRAZOLE 40 MG PO TBEC
40 mg | Freq: Every day | ORAL | 0 refills | Status: AC
Start: 2021-07-23 — End: ?
  Administered 2021-07-24 – 2021-08-01 (×8): 40 mg via ORAL

## 2021-07-23 MED ORDER — ATORVASTATIN 20 MG PO TAB
20 mg | Freq: Every day | ORAL | 0 refills | Status: AC
Start: 2021-07-23 — End: ?
  Administered 2021-07-24 – 2021-08-01 (×8): 20 mg via ORAL

## 2021-07-23 MED ORDER — IOHEXOL 350 MG IODINE/ML IV SOLN
70 mL | Freq: Once | INTRAVENOUS | 0 refills | Status: CP
Start: 2021-07-23 — End: ?
  Administered 2021-07-23: 21:00:00 70 mL via INTRAVENOUS

## 2021-07-23 MED ORDER — LEVOTHYROXINE 150 MCG PO TAB
300 ug | Freq: Every day | ORAL | 0 refills | Status: AC
Start: 2021-07-23 — End: ?
  Administered 2021-07-24: 12:00:00 300 ug via ORAL

## 2021-07-23 MED ORDER — LEVOTHYROXINE 25 MCG PO TAB
25 ug | Freq: Every day | ORAL | 0 refills | Status: AC
Start: 2021-07-23 — End: ?
  Administered 2021-07-24: 12:00:00 25 ug via ORAL

## 2021-07-23 MED ORDER — MEROPENEM 1G/100ML NS IVPB (MB+)
1 g | Freq: Once | INTRAVENOUS | 0 refills | Status: DC
Start: 2021-07-23 — End: 2021-07-24

## 2021-07-23 MED ORDER — MELATONIN 5 MG PO TAB
5 mg | Freq: Every evening | ORAL | 0 refills | Status: AC
Start: 2021-07-23 — End: ?
  Administered 2021-07-24 – 2021-08-01 (×8): 5 mg via ORAL

## 2021-07-23 MED ORDER — SENNOSIDES 8.6 MG PO TAB
1 | Freq: Two times a day (BID) | ORAL | 0 refills | Status: AC
Start: 2021-07-23 — End: ?
  Administered 2021-07-24 – 2021-07-29 (×7): 1 via ORAL

## 2021-07-23 MED ORDER — MEROPENEM IVPB
2 g | Freq: Once | INTRAVENOUS | 0 refills | Status: CP
Start: 2021-07-23 — End: ?
  Administered 2021-07-23 (×2): 2 g via INTRAVENOUS

## 2021-07-23 MED ORDER — MEROPENEM 1G/100ML NS IVPB (MB+)(EXTENDED INFUSION)
1 g | INTRAVENOUS | 0 refills | Status: AC
Start: 2021-07-23 — End: ?
  Administered 2021-07-24 (×4): 1 g via INTRAVENOUS

## 2021-07-23 MED ORDER — ONDANSETRON HCL (PF) 4 MG/2 ML IJ SOLN
4 mg | INTRAVENOUS | 0 refills | Status: AC | PRN
Start: 2021-07-23 — End: ?
  Administered 2021-07-25 – 2021-07-29 (×4): 4 mg via INTRAVENOUS

## 2021-07-23 MED ORDER — SERTRALINE 50 MG PO TAB
50 mg | Freq: Every day | ORAL | 0 refills | Status: AC
Start: 2021-07-23 — End: ?
  Administered 2021-07-24: 13:00:00 50 mg via ORAL

## 2021-07-23 MED ORDER — DEXTROSE 50 % IN WATER (D50W) IV SYRG
12.5-25 g | INTRAVENOUS | 0 refills | Status: AC | PRN
Start: 2021-07-23 — End: ?

## 2021-07-23 MED ORDER — SENNOSIDES-DOCUSATE SODIUM 8.6-50 MG PO TAB
1 | Freq: Every day | ORAL | 0 refills | Status: DC | PRN
Start: 2021-07-23 — End: 2021-07-23

## 2021-07-23 MED ORDER — POLYETHYLENE GLYCOL 3350 17 GRAM PO PWPK
1 | Freq: Two times a day (BID) | ORAL | 0 refills | Status: AC
Start: 2021-07-23 — End: ?
  Administered 2021-07-25 – 2021-07-26 (×3): 17 g via ORAL

## 2021-07-23 MED ORDER — GABAPENTIN 400 MG PO CAP
1200 mg | Freq: Two times a day (BID) | ORAL | 0 refills | Status: AC
Start: 2021-07-23 — End: ?
  Administered 2021-07-24 – 2021-07-27 (×7): 1200 mg via ORAL

## 2021-07-23 MED ORDER — INSULIN GLARGINE 100 UNIT/ML (3 ML) SC INJ PEN
20 [IU] | Freq: Every evening | SUBCUTANEOUS | 0 refills | Status: AC
Start: 2021-07-23 — End: ?
  Administered 2021-07-24 – 2021-07-26 (×2): 20 [IU] via SUBCUTANEOUS

## 2021-07-23 MED ORDER — MEROPENEM 1G/100ML NS IVPB (MB+)(EXTENDED INFUSION)
1 g | INTRAVENOUS | 0 refills | Status: DC
Start: 2021-07-23 — End: 2021-07-24

## 2021-07-23 MED ORDER — ASPIRIN 81 MG PO CHEW
81 mg | Freq: Every day | ORAL | 0 refills | Status: AC
Start: 2021-07-23 — End: ?
  Administered 2021-07-24 – 2021-07-25 (×2): 81 mg via ORAL

## 2021-07-23 MED ORDER — DUTASTERIDE 0.5 MG PO CAP
.5 mg | Freq: Every day | ORAL | 0 refills | Status: AC
Start: 2021-07-23 — End: ?
  Administered 2021-07-24 – 2021-08-01 (×8): 0.5 mg via ORAL

## 2021-07-23 MED ORDER — POLYETHYLENE GLYCOL 3350 17 GRAM PO PWPK
1 | Freq: Every day | ORAL | 0 refills | Status: DC | PRN
Start: 2021-07-23 — End: 2021-07-23

## 2021-07-23 MED ORDER — LOSARTAN 25 MG PO TAB
25 mg | Freq: Every day | ORAL | 0 refills | Status: AC
Start: 2021-07-23 — End: ?
  Administered 2021-07-24 – 2021-07-25 (×2): 25 mg via ORAL

## 2021-07-23 MED ORDER — OXYCODONE 5 MG PO TAB
5 mg | ORAL | 0 refills | Status: AC | PRN
Start: 2021-07-23 — End: ?
  Administered 2021-07-24 (×3): 5 mg via ORAL

## 2021-07-23 NOTE — H&P (View-Only)
Admission History and Physical Examination      Name: Jimmy Waters   MRN: [id number]     DOB: 1955/07/26      Age: 66 y.o.  Admission Date: 07/23/2021     LOS: 0 days     Date of Service: 07/23/2021                        Assessment/Plan:    Principal Problem:    Complicated UTI (urinary tract infection)      Jimmy Waters is a 66 y.o. male history of benign prostatic  hypertrophy, urinary retention with prior chronic indwelling Foley catheter now s/p R nephrostomy tube palcement, diabetes mellitus, recent MRSA bacteremia, HTN, HLD, recent recurrence of thyroid cancer (planned surgery 07/27/21 at [facility]), who presents to the ED after outpatient UA was consistent with UTI.      #Complicated UTI  #History of nephrolithiasis resulting in right-sided hydronephrosis s/p stent placement  #S/p right-sided nephrostomy tube placement  #History of MDRO UTI  #BPH  #urinary incontinence, chronic  -Patient with complicated urological history, has had care at [facility] and with [facility] urology, in December 2022 had right-sided hydronephrosis due to nephrolithiasis requiring stent placement, subsequently needed nephrostomy tube placement; has had recurrent UTIs with bacteremia requiring hospitalization  -Had labs done outpatient due to bloody urine in neph bag, UA concerning for infection with culture growing greater than 100 K Pseudomonas, 10-49 K's Klebsiella; therefore told to report [facility] ED  -Upon presentation to [place], hemodynamically stable, no tachycardia, no leukocytosis, no fevers  -Vitals: HR 77, temp 36.3, BP 132/83, RR 18, 98% on room air  -Exam: Nephrostomy bag yellow cloudy urine, and no obvious blood.  Mild abdominal tenderness right and left lower quadrants  Labs: Hemoglobin 11.6, WBC 8.7, creatinine 1.2, lactic acid 1.4  UA at [place]: 3+ leukocytes, positive nitrites, 3+ blood, bacteria packed  -CTA abdomen pelvis with contrast  1. Right percutaneous nephroureteral stent in satisfactory position. There   is a small right perinephric fluid collection which is nonspecific and   could represent a urinoma or evolving hematoma. Abscess is not felt likely   given the lack of significant wall thickening or enhancement. There is a   thin linear hypodensity at the anterior right lower pole which appears to   communicate with the perinephric collection, possibly a renal laceration   or, less likely, mild striated nephrogram related to pyelonephritis. Small   amount of gas at the right renal collecting system is likely due to the   catheter being present.   2. Mild wall thickening of the mid to distal left ureter, suspect for   ascending UTI, though no left-sided striated nephrogram is seen.   3. Probable mild wall thickening in the poorly distended urinary bladder.   There is also perivesicular fat stranding, compatible with cystitis given   reported positive UA.   4. Tiny nonobstructing right nephrolithiasis.   5. No bowel obstruction or ascites.   6. Mild hepatomegaly.     PLAN  --Patient with complicated urological history, UA consistent with complicated UTI.  Fortunately, not showing any signs of SIRS/sepsis currently.  We will monitor him very closely and continue meropenem considering prior drug-resistant infections.  Also I have discussed patient with urology who have reviewed imaging and do not see any immediate intervention needed but plan to see patient tonight.  > Will attempt to obtain OSH urine culture data and susceptibilities  >  Continue meropenem  > Monitor blood cultures and urine culture  > Consult urology, will follow up recommendations  > We will consult infectious disease in the a.m.  > Continue PTA tamsulosin, dutasteride  > N.p.o. at midnight and hold DVT prophylaxis in case of any procedures in the a.m.  > Tylenol first-line for pain, oxycodone 5 mg every 6 hours as needed second line    #DM type II  -PTA regimen Lantus 30 units nightly, 24 units aspart 3 times daily, Trulicity which was started yesterday  > We will start with Lantus 20 units nightly, hold mealtime as patient will be n.p.o. midnight  > LD CF  > Hold PTA Trulicity    #Weakness  -Likely related to recurrent hospitalizations  -Currently using walker to ambulate  > PT OT    #Papillary thyroid cancer  -Initially diagnosed in 2009, underwent thyroidectomy  -Recurrence in 2018 treated with RAI  -Now concern for new recurrence with plans for surgery 07/27/2021  > We will make endocrinology  And ENT teams aware in the a.m.      #Hypothyroidism  > Continue PTA Synthroid 325    #Constipation  -Scheduled bowel regimen      #Hypertension  #Hyperlipidemia  > Continue PTA aspirin 81 mg, atorvastatin 20 mg, metoprolol tartrate 50 mg twice daily, losartan 25 mg    #GERD  > Continue PTA PPI      #Mood  > Continue PTA sertraline, bupropion      DVT ppx: Holding in case of any procedures  Diet: Diabetic, n.p.o. at midnight in case any procedures  Code: Full  Dispo: admit to med 3/telemetry      Patient discussed with Bhakta.    Carlisle Cater, MD  Internal Medicine PGY-2    _____________________________________________________________________________    Primary Care Physician: Caren Griffins  PCP Unknown    Chief Complaint: Urinary tract infection  History of Present Illness: Jimmy Waters is a 66 y.o. male history of benign prostatic  hypertrophy, urinary retention with prior chronic indwelling Foley catheter now s/p R nephrostomy tube palcement, diabetes mellitus, recent MRSA bacteremia, HTN, HLD, recent recurrence of thyroid cancer (planned surgery 07/27/21 at [facility]), who presents to the ED after outpatient UA was consistent with UTI.    Patient with complicated urological history, has had care at [facility] and with [facility] urology.  In December 2022 he had right-sided hydronephrosis secondary to nephrolithiasis requiring stent placement, subsequently needed nephrostomy tube placement.  He has had recurrent drug-resistant UTIs with bacteremia requiring hospitalization since that time.  Most recently he was discharged on 07/08/2021.  During that hospitalization he was admitted for conversion of double-J stent removal to nephroureteral tube.  Per chart review, he had urine cultures that grew E. coli resistant to Rocephin and was treated with IV meropenem.  Patient then completed a stay at inpatient rehab.  Today he tells me he did not feel like he was ready to go home upon his discharge and has been very weak.  He uses a walker to ambulate since discharge.  He does not believe there have been any new particular symptoms over the last few days and does not feel like he has necessarily been worse than usual.  He was getting labs done outpatient in preparation for surgery on his neck on May 4 due to recurrence of thyroid cancer; this surgery is here at [facility]  History of thyroid cancer initially diagnosed in 2009, with recurrence in 2018,  and now most recently an additional recurrence this past month.  Unfortunately,  UA concerning for infection with culture growing greater than 100 K Pseudomonas, 10-49 K's Klebsiella; therefore his PCP told him to report [facility] ED.    Patient currently feels okay aside from some abdominal discomfort.  Again he does not feel like he has felt acutely worse over the last few days but is just generally weak.  Denies any fevers or chills.  Although he has a nephrostomy bag on his right side, he still does make urine due to his left kidney, and he is incontinent of urine which is not new.        Medical History:   Diagnosis Date   ? Anxiety disorder    ? Back pain    ? Barrett esophagus    ? BPH (benign prostatic hyperplasia)    ? Cancer of skin    ? Cataract    ? Chronic kidney disease unknown   ? Chronic lower back pain    ? Chronic UTI    ? Degenerative arthritis    ? Depression (disease)    ? Diverticulitis of colon (without mention of hemorrhage)(562.11)     type 2   ? DJD (degenerative joint disease)    ? DM type 2 (diabetes mellitus, type 2) (HCC)    ? Dyslipidemia    ? Erectile dysfunction 2021   ? Falls 2022   ? Gastroparesis    ? Generalized headaches 2022   ? GERD (gastroesophageal reflux disease)    ? Gynecomastia    ? History of dental problems     lower partial   ? History of Nissen fundoplication ~1990   ? HLD (hyperlipidemia)    ? HTN (hypertension)    ? Hx of arthroscopy of knee    ? Hypothyroidism    ? Infection    ? Kidney stones    ? 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 Neuropathy    ? Osteoarthritis    ? Poor balance 2022   ? Shiga toxin-producing Escherichia coli infection    ? Skin cancer    ? Squamous cell carcinoma    ? Stomach disorder    ? Thyroid cancer (HCC) 2009   ? Thyroid nodule 2009, 2017, 2022   ? Unspecified deficiency anemia 2022     Surgical History:   Procedure Laterality Date   ? HX LUMBAR FUSION  1982    L4-5   ? HX THYROIDECTOMY  09/2007   ? CYSTOURETHROSCOPY  2012   ? KIDNEY STONE SURGERY  2012   ? COLONOSCOPY  02/14/2011   ? HX APPENDECTOMY  02/2011   ? REPEAT LEFT LUMBAR 5 TO SACRAL 1 LAMINOTOMY N/A 01/02/2019    Performed by Karie Chimera, MD at [facility] OR   ? INCISION AND DRAINAGE POSTOPERATIVE WOUND INFECTION - COMPLEX N/A 01/20/2019    Performed by Karie Chimera, MD at [facility] OR   ? NEPHROSTOMY Right 04/2021    in place from right ureter from OSH   ? CATARACT REMOVAL WITH IMPLANT Bilateral    ? CYSTOSCOPY     ? HX HIP REPLACEMENT Bilateral     revision of 1 hip also   ? HX KNEE ARTHROSCOPY     ? HX KNEE REPLACMENT Bilateral    ? HX LAPAROTOMY      possible colon fistula   ? HX LYMPHADENECTOMY     ?  HX ROTATOR CUFF REPAIR Bilateral     [id number]   ? HX SHOULDER REPLACEMENT Bilateral    ? HX TONSILLECTOMY     ? HX VASECTOMY     ? PARATHYROID GLAND SURGERY  2009   ? THYROIDECTOMY     ? UPPER GASTROINTESTINAL ENDOSCOPY       Family History   Problem Relation Age of Onset   ? Hypertension Mother    ? Cancer-Lung Mother 63   ? Melanoma Mother    ? Cancer Mother         lung   ? Diabetes Mother    ? Heart Disease Mother    ? Hyperlipidemia Mother    ? Thyroid Disease Mother    ? Diabetes Father    ? Arthritis-osteo Sister    ? Hypertension Sister    ? Heart Disease Sister    ? Cancer Sister         lymphoma   ? Diabetes Sister    ? Arthritis Sister    ? Cancer Sister    ? Cancer-Lung Brother 80   ? Hyperlipidemia Brother    ? Hypertension Brother    ? Diabetes Brother    ? Arthritis Brother    ? Cancer Brother         lung   ? Kidney Disease Brother    ? Cancer-Colon Maternal Aunt    ? Cancer Maternal Uncle    ? Unknown to Patient Paternal Aunt    ? Unknown to Patient Paternal Uncle    ? Unknown to Patient Maternal Grandmother    ? Unknown to Patient Maternal Grandfather    ? Unknown to Patient Paternal Grandmother    ? Unknown to Patient Paternal Grandfather    ? Cancer Sister         esophaguse   ? Diabetes Sister    ? Diabetes Brother    ? Heart Disease Sister    ? Obesity Brother      Social History     Tobacco Use   ? Smoking status: Never   ? Smokeless tobacco: Never   Vaping Use   ? Vaping Use: Never used   Substance and Sexual Activity   ? Alcohol use: Yes     Alcohol/week: 1.0 standard drink     Types: 1 Drinks containing 0.5 oz of alcohol per week     Comment: ~ once a month   ? Drug use: No   ? Sexual activity: Not Currently     Partners: Female     Birth control/protection: None             Immunizations (includes history and patient reported):   Immunization History   Administered Date(s) Administered   ? COVID-19 (PFIZER tris-sucrose) mRNA vacc, 30 mcg/0.3 mL (PF) 07/29/2020   ? COVID-19 (PFIZER), mRNA vacc, 30 mcg/0.3 mL (PF) 02/13/2020   ? Flu Vaccine =>65 YO High-Dose (PF) 12/02/2020   ? Flu Vaccine Trivalent =>3 Yo (Preservative Free) 01/01/2013, 12/02/2013   ? Pneumococcal Vaccine (23-Val Adult) 11/06/2013   ? Tdap Vaccine 09/11/2013, 02/09/2021           Allergies:  Ambien [zolpidem] and Statins-hmg-coa reductase inhibitors    Medications:  Medications Prior to Admission   Medication Sig   ? acetaminophen (TYLENOL) 500 mg tablet Take two tablets by mouth every 6 hours. Max of 4,000 mg of acetaminophen in 24 hours. (Patient taking differently: Take 650 mg by mouth every 6 hours  as needed. Max of 4,000 mg of acetaminophen in 24 hours.)   ? ascorbic acid 1,000 mg tablet Take one tablet by mouth daily.   ? aspirin 81 mg chewable tablet Chew one tablet by mouth daily. Take with food.   ? atorvastatin (LIPITOR) 20 mg tablet Take one tablet by mouth daily.   ? blood-glucose meter,continuous (DEXCOM G6 RECEIVER MISC)   Dexcom G6 Receiver, See Instructions, 1 EA, Use as directed., 0 Number of Refills, 0, Route to Pharmacy Electronically, [facility], [facility] (New Address), Instructions Replace Required Details, NCPDP_[id number], 185, cm, 04/25/20 11:38:00 CST, Height   ? blood-glucose sensor (DEXCOM G6 SENSOR MISC)   Dexcom G6 Sensor, See Instructions, 9 EA, Use as directed.  Change every 10 days., 3 Number of Refills, 3, Route to Pharmacy Electronically, [facility], [facility] (New Address), Instructions Replace Required Details, [id number]_[id number], 185, cm, 04/25/20...   ? blood-glucose transmitter (DEXCOM G6 TRANSMITTER MISC)   Dexcom G6 Transmitter, See Instructions, 1 EA, Use as directed., 3 Number of Refills, 3, Route to Pharmacy Electronically, [facility], [facility] (New Address), Instructions Replace Required Details, [id number]_[id number], 185, cm, 04/25/20 11:38:00 CST, Height   ? buPROPion HCL SR (WELLBUTRIN SR) 150 mg tablet, 12 hr sustained-release Take one tablet by mouth twice daily.   ? CHOLEcalciferoL (vitamin D3) (VITAMIN D3) 125 mcg (5,000 unit) capsule Take one capsule by mouth daily.     ? diclofenac sodium (VOLTAREN) 1 % topical gel Apply  topically to affected area as Needed.   ? dulaglutide (TRULICITY) 0.75 mg/0.5 mL injection pen Inject 0.5 mL under the skin every 7 days. (Patient taking differently: Inject 0.5 mL under the skin every Sunday.)   ? dutasteride (AVODART) 0.5 mg capsule Take 1 Cap by mouth daily. (Patient taking differently: Take one capsule by mouth at bedtime daily.)   ? empagliflozin (JARDIANCE) 25 mg tablet Take one tablet by mouth daily.   ? Fluocinolone 0.01 % oil Apply  topically to affected area as Needed.   ? gabapentin (NEURONTIN) 600 mg tablet Take two tablets by mouth twice daily.   ? hydrocortisone 1 % topical cream Apply  topically to affected area as Needed.   ? insulin glargine (LANTUS SOLOSTAR U-100 INSULIN) 100 unit/mL (3 mL) subcutaneous PEN Inject forty Units under the skin at bedtime daily.   ? insulin lispro (U-100) (HUMALOG KWIKPEN INSULIN) 100 unit/mL subcutaneous PEN Inject twenty four Units under the skin three times daily with meals.   ? levothyroxine (SYNTHROID) 25 mcg tablet Take one tablet by mouth daily 30 minutes before breakfast. Take in addition to 300 mcg to make total 325 mcg daily.   ? levothyroxine (SYNTHROID) 300 mcg tablet Take one tablet by mouth daily 30 minutes before breakfast.   ? meclizine (ANTIVERT) 25 mg tablet Take one tablet by mouth three times daily as needed. Indications: nausea   ? Melatonin 5 mg cap Take one capsule by mouth at bedtime daily.   ? metoprolol tartrate (LOPRESSOR) 50 mg tablet Take one tablet by mouth twice daily.   ? ondansetron (ZOFRAN ODT) 8 mg rapid dissolve tablet Dissolve one tablet by mouth as Needed.   ? oxybutynin XL (DITROPAN XL) 15 mg tablet Take one tablet by mouth daily.   ? pantoprazole DR (PROTONIX) 40 mg tablet Take one tablet by mouth daily.   ? sertraline (ZOLOFT) 50 mg tablet Take one tablet by mouth daily.   ? tamsulosin (FLOMAX) 0.4 mg capsule Take one capsule by mouth daily. Do not crush, chew  or open capsules. Take 30 minutes following the same meal each day.   ? vitamins, multiple tablet Take one tablet by mouth daily.       Current Facility-Administered Medications   Medication   ? acetaminophen (TYLENOL) tablet 650 mg   ? aspirin chewable tablet 81 mg   ? atorvastatin (LIPITOR) tablet 20 mg   ? buPROPion XL (WELLBUTRIN XL) tablet 300 mg   ? dutasteride (AVODART) capsule 0.5 mg   ? gabapentin (NEURONTIN) capsule 1,200 mg   ? [START ON 07/24/2021] levothyroxine (SYNTHROID) tablet 25 mcg   ? [START ON 07/24/2021] levothyroxine (SYNTHROID) tablet 300 mcg   ? losartan (COZAAR) tablet 25 mg   ? melatonin tablet 5 mg   ? melatonin tablet 5 mg   ? meropenem (MERREM) 1 g in sodium chloride 0.9% (NS) 100 mL IVPB (MB+)    Followed by   ? [START ON 07/24/2021] meropenem (MERREM) 1 g in sodium chloride 0.9% (NS) 100 mL IVPB (MB+)(EXTENDED INFUSION)   ? metoprolol tartrate (LOPRESSOR) tablet 50 mg   ? ondansetron (ZOFRAN ODT) rapid dissolve tablet 4 mg    Or   ? ondansetron (ZOFRAN) injection 4 mg   ? oxybutynin XL (DITROPAN XL) tablet 15 mg   ? oxyCODONE (ROXICODONE) tablet 5 mg   ? pantoprazole DR (PROTONIX) tablet 40 mg   ? polyethylene glycol 3350 (MIRALAX) packet 17 g   ? senna (SENOKOT) tablet 1 tablet   ? sertraline (ZOLOFT) tablet 50 mg   ? tamsulosin (FLOMAX) capsule 0.4 mg     Review of Systems:  14 point ROS was completed and negative except as otherwise stated as per HPI    Physical Exam:  Vital Signs: Last Filed In 24 Hours Vital Signs: 24 Hour Range   BP: 143/74 (04/30 1800)  Temp: 36.3 ?C (97.4 ?F) (04/30 1002)  Pulse: 77 (04/30 1427)  Respirations: 16 PER MINUTE (04/30 1427)  SpO2: 98 % (04/30 1752)  O2 Device: None (Room air) (04/30 1427)  SpO2 Pulse: 72 (04/30 1752)  Height: 182.9 cm (6') (04/30 1002) BP: (132-170)/(74-92)   Temp:  [36.3 ?C (97.4 ?F)]   Pulse:  [77-79]   Respirations:  [16 PER MINUTE-20 PER MINUTE]   SpO2:  [97 %-100 %]   O2 Device: None (Room air)   Intensity Pain Scale (Self Report): 6 (07/23/21 1003)      Physical Exam  Constitutional:       General: He is not in acute distress.     Appearance: He is normal weight.   HENT:      Head: Normocephalic.      Right Ear: External ear normal.      Left Ear: External ear normal.      Nose: Nose normal.      Mouth/Throat:      Mouth: Mucous membranes are moist.      Pharynx: Oropharynx is clear.   Eyes:      Extraocular Movements: Extraocular movements intact.      Conjunctiva/sclera: Conjunctivae normal.      Pupils: Pupils are equal, round, and reactive to light.   Cardiovascular:      Rate and Rhythm: Normal rate and regular rhythm.      Pulses: Normal pulses.      Heart sounds: No murmur heard.  Pulmonary:      Effort: Pulmonary effort is normal. No respiratory distress.      Breath sounds: No wheezing.   Abdominal:  General: Abdomen is flat. Bowel sounds are normal.      Tenderness: There is abdominal tenderness (Right lower quadrant, left lower quadrant).      Comments: Right-sided nephrostomy bag with yellow cloudy urine   Musculoskeletal:         General: No swelling. Normal range of motion.      Cervical back: Normal range of motion.      Right lower leg: No edema.      Left lower leg: No edema.   Skin:     General: Skin is warm.      Capillary Refill: Capillary refill takes less than 2 seconds.      Findings: No lesion or rash.   Neurological:      General: No focal deficit present.      Mental Status: He is alert and oriented to person, place, and time. Mental status is at baseline.   Psychiatric:         Mood and Affect: Mood normal.         Behavior: Behavior normal.         Thought Content: Thought content normal.         Judgment: Judgment normal.           Lab/Radiology/Other Diagnostic Tests:  24-hour labs:    Results for orders placed or performed during the hospital encounter of 07/23/21 (from the past 24 hour(s))   URINALYSIS DIPSTICK REFLEX TO CULTURE    Collection Time: 07/23/21  1:53 PM    Specimen: Urine   Result Value Ref Range    Color,UA YELLOW     Turbidity,UA 2+ (A) CLEAR-CLEAR    Specific Gravity-Urine 1.016 1.005 - 1.030    pH,UA 6.0 5.0 - 8.0    Protein,UA 2+ (A) NEG-NEG    Glucose,UA NEG NEG-NEG    Ketones,UA NEG NEG-NEG Bilirubin,UA NEG NEG-NEG    Blood,UA 3+ (A) NEG-NEG    Urobilinogen,UA NORMAL NORM-NORMAL    Nitrite,UA POS (A) NEG-NEG    Leukocytes,UA 3+ (A) NEG-NEG    Urine Ascorbic Acid, UA NEG NEG-NEG   URINALYSIS MICROSCOPIC REFLEX TO CULTURE    Collection Time: 07/23/21  1:53 PM    Specimen: Urine   Result Value Ref Range    WBCs,UA PACKED 0 - 2 /HPF    RBCs,UA PACKED 0 - 3 /HPF    Comment,UA       Criteria for reflex to culture are WBC>10, Positive Nitrite, and/or >=+1   leukocytes. If quantity is not sufficient, an addendum will follow.      MucousUA TRACE     Bacteria,UA PACKED (A) NEG-NEG    Squamous Epithelial Cells 0-2 0 - 5   CBC AND DIFF    Collection Time: 07/23/21  2:11 PM   Result Value Ref Range    White Blood Cells 8.7 4.5 - 11.0 K/UL    RBC 4.18 (L) 4.4 - 5.5 M/UL    Hemoglobin 11.6 (L) 13.5 - 16.5 GM/DL    Hematocrit 16.1 (L) 40 - 50 %    MCV 86.2 80 - 100 FL    MCH 27.8 26 - 34 PG    MCHC 32.2 32.0 - 36.0 G/DL    RDW 09.6 (H) 11 - 15 %    Platelet Count 214 150 - 400 K/UL    MPV 8.6 7 - 11 FL    Neutrophils 62 41 - 77 %    Lymphocytes 28 24 - 44 %    Monocytes  5 4 - 12 %    Eosinophils 4 0 - 5 %    Basophils 1 0 - 2 %    Absolute Neutrophil Count 5.39 1.8 - 7.0 K/UL    Absolute Lymph Count 2.42 1.0 - 4.8 K/UL    Absolute Monocyte Count 0.45 0 - 0.80 K/UL    Absolute Eosinophil Count 0.35 0 - 0.45 K/UL    Absolute Basophil Count 0.09 0 - 0.20 K/UL    MDW (Monocyte Distribution Width) 20.8 (H) <20.7   COMPREHENSIVE METABOLIC PANEL    Collection Time: 07/23/21  2:11 PM   Result Value Ref Range    Sodium 140 137 - 147 MMOL/L    Potassium 4.0 3.5 - 5.1 MMOL/L    Chloride 105 98 - 110 MMOL/L    Glucose 143 (H) 70 - 100 MG/DL    Blood Urea Nitrogen 22 7 - 25 MG/DL    Creatinine 1.61 0.4 - 1.24 MG/DL    Calcium 9.7 8.5 - 09.6 MG/DL    Total Protein 8.7 (H) 6.0 - 8.0 G/DL    Total Bilirubin 0.4 0.3 - 1.2 MG/DL    Albumin 4.1 3.5 - 5.0 G/DL    Alk Phosphatase 045 (H) 25 - 110 U/L    AST (SGOT) 14 7 - 40 U/L    CO2 24 21 - 30 MMOL/L    ALT (SGPT) 9 7 - 56 U/L    Anion Gap 11 3 - 12    eGFR >60 >60 mL/min   POC CREATININE, RAD    Collection Time: 07/23/21  2:17 PM   Result Value Ref Range    Creatinine, POC 1.2 0.4 - 1.24 MG/DL   POC LACTATE    Collection Time: 07/23/21  2:17 PM   Result Value Ref Range    LACTIC ACID POC 1.4 0.5 - 2.0 MMOL/L     Glucose: (!) 143 (07/23/21 1411)  Pertinent radiology reviewed.    Carlisle Cater, MD

## 2021-07-23 NOTE — ED Notes
P/t wheeled to [address]42, transferred to cart independently.  P/t endorses receiving a cal from his doctors office today regarding positive urine cultures.  He was urged to come to the ED for evaluation and abx due to upcoming thyroid surgery next week.  P/t has extensive h/x of urinary infections and currently has a R. Nephrostomy tube draining dark yellow urine, cloudy.  P/t denies pain.  A&Ox4, GCS 15, rr even and unlabored. No needs identified, bed in low/locked position.

## 2021-07-24 ENCOUNTER — Encounter: Admit: 2021-07-24 | Discharge: 2021-07-24 | Payer: MEDICARE

## 2021-07-24 DIAGNOSIS — E041 Nontoxic single thyroid nodule: Secondary | ICD-10-CM

## 2021-07-24 DIAGNOSIS — C4490 Unspecified malignant neoplasm of skin, unspecified: Secondary | ICD-10-CM

## 2021-07-24 DIAGNOSIS — M1990 Unspecified osteoarthritis, unspecified site: Secondary | ICD-10-CM

## 2021-07-24 DIAGNOSIS — F419 Anxiety disorder, unspecified: Secondary | ICD-10-CM

## 2021-07-24 DIAGNOSIS — K319 Disease of stomach and duodenum, unspecified: Secondary | ICD-10-CM

## 2021-07-24 DIAGNOSIS — D539 Nutritional anemia, unspecified: Secondary | ICD-10-CM

## 2021-07-24 DIAGNOSIS — N390 Urinary tract infection, site not specified: Secondary | ICD-10-CM

## 2021-07-24 DIAGNOSIS — M549 Dorsalgia, unspecified: Secondary | ICD-10-CM

## 2021-07-24 DIAGNOSIS — N62 Hypertrophy of breast: Secondary | ICD-10-CM

## 2021-07-24 DIAGNOSIS — E3120 Multiple endocrine neoplasia [MEN] syndrome, unspecified: Secondary | ICD-10-CM

## 2021-07-24 DIAGNOSIS — E039 Hypothyroidism, unspecified: Secondary | ICD-10-CM

## 2021-07-24 DIAGNOSIS — E785 Hyperlipidemia, unspecified: Secondary | ICD-10-CM

## 2021-07-24 DIAGNOSIS — B999 Unspecified infectious disease: Secondary | ICD-10-CM

## 2021-07-24 DIAGNOSIS — K3184 Gastroparesis: Secondary | ICD-10-CM

## 2021-07-24 DIAGNOSIS — IMO0002 Squamous cell carcinoma: Secondary | ICD-10-CM

## 2021-07-24 DIAGNOSIS — N529 Male erectile dysfunction, unspecified: Secondary | ICD-10-CM

## 2021-07-24 DIAGNOSIS — E119 Type 2 diabetes mellitus without complications: Secondary | ICD-10-CM

## 2021-07-24 DIAGNOSIS — R2689 Other abnormalities of gait and mobility: Secondary | ICD-10-CM

## 2021-07-24 DIAGNOSIS — N200 Calculus of kidney: Secondary | ICD-10-CM

## 2021-07-24 DIAGNOSIS — I10 Essential (primary) hypertension: Secondary | ICD-10-CM

## 2021-07-24 DIAGNOSIS — K5732 Diverticulitis of large intestine without perforation or abscess without bleeding: Secondary | ICD-10-CM

## 2021-07-24 DIAGNOSIS — A498 Other bacterial infections of unspecified site: Secondary | ICD-10-CM

## 2021-07-24 DIAGNOSIS — C73 Malignant neoplasm of thyroid gland: Secondary | ICD-10-CM

## 2021-07-24 DIAGNOSIS — R519 Generalized headaches: Secondary | ICD-10-CM

## 2021-07-24 DIAGNOSIS — K2270 Barrett's esophagus without dysplasia: Secondary | ICD-10-CM

## 2021-07-24 DIAGNOSIS — N189 Chronic kidney disease, unspecified: Secondary | ICD-10-CM

## 2021-07-24 DIAGNOSIS — N400 Enlarged prostate without lower urinary tract symptoms: Secondary | ICD-10-CM

## 2021-07-24 DIAGNOSIS — M545 Chronic lower back pain: Secondary | ICD-10-CM

## 2021-07-24 DIAGNOSIS — K219 Gastro-esophageal reflux disease without esophagitis: Secondary | ICD-10-CM

## 2021-07-24 DIAGNOSIS — G629 Polyneuropathy, unspecified: Secondary | ICD-10-CM

## 2021-07-24 DIAGNOSIS — W19XXXA Unspecified fall, initial encounter: Secondary | ICD-10-CM

## 2021-07-24 DIAGNOSIS — Z9889 Other specified postprocedural states: Secondary | ICD-10-CM

## 2021-07-24 DIAGNOSIS — Z8719 Personal history of other diseases of the digestive system: Secondary | ICD-10-CM

## 2021-07-24 MED ADMIN — SODIUM CHLORIDE 0.9 % IV SOLP [27838]: 2 g | INTRAVENOUS | @ 21:00:00 | NDC 00338004938

## 2021-07-24 MED ADMIN — OXYCODONE 5 MG PO TAB [10814]: 10 mg | ORAL | @ 21:00:00 | NDC 00904696661

## 2021-07-24 MED ADMIN — MEROPENEM 1 GRAM IV SOLR [80713]: 2 g | INTRAVENOUS | @ 21:00:00 | NDC 55150020830

## 2021-07-24 MED ADMIN — OXYCODONE 5 MG PO TAB [10814]: 5 mg | ORAL | @ 17:00:00 | NDC 00904696661

## 2021-07-24 MED ADMIN — ACETAMINOPHEN 500 MG PO TAB [102]: 1000 mg | ORAL | @ 21:00:00 | NDC 00904673061

## 2021-07-24 MED ADMIN — LIDOCAINE 5 % TP PTMD [80759]: 1 | TOPICAL | @ 17:00:00 | NDC 00591352511

## 2021-07-24 MED ADMIN — SODIUM CHLORIDE 0.9 % IV SOLP [27838]: 250 mL | INTRAVENOUS | @ 06:00:00 | Stop: 2021-07-24 | NDC 00338004902

## 2021-07-24 MED ADMIN — LACTATED RINGERS IV SOLP [4318]: 1000 mL | INTRAVENOUS | @ 17:00:00 | Stop: 2021-07-24 | NDC 00338011704

## 2021-07-24 NOTE — Consults
[facility] Consult Note  07/23/2021       Patient: Jimmy Waters  MRN: [id number]    Admission Date:  07/23/2021, LOS: 0 days  Admission Diagnosis: Complicated UTI (urinary tract infection) [N39.0]  Date of Service: July 23, 2021    Reason for Consult: Complicated UTI  Referring Provider: Dorann Ou, MD  Attending Surgeon: Daun Peacock, MD  Consult Performed by: Cristino Martes, MD    ASSESSMENT: 66 y.o. male with obesity, HTN, HLD, DMII on insulin (A1C 7.2%), metastatic thyroid cancer, hypothyroidism, GERD, arthritis, depression and BPH with urinary retention and recurrent urosepsis, previously managed with a Foley catheter, recent RIGHT ureteral injury s/p nephroureteral drain further complicated by a subcapsular hematoma, now preadmitted to medicine with concern for a complicated UTI ahead of his scheduled 5/4 neck dissection for thyroid cancer. His recent urological history under the care of [facility] is complicated and detailed in the below HPI. In the long term, he would like to transfer his urological care to [facility]. In the short term, given his history of recurrent urosepsis and importance of his upcoming surgery, empiric antibiotics to avoid a surgical delay are appropriate based on his UA findings suggestive of a UTI. Review of his vitals, labs and imaging demonstrates a small subcuapsular fluid collection consistent with his known recent perinephric hematoma and no evidence of fever/tachycardia, leukocytosis, acidosis or significant AKI.    PLAN:    - No acute surgical intervention  - Maintain RIGHT nephrostomy tube to dependent drainage  - Recommend post-void residual bladder scan to rule out overflow incontinence  - F/u blood and urine cultures   > Agree with empiric antibiotics based on prior cultures from [facility]  - Will arrange for outpatient [facility] follow-up to discuss next steps in his complicated urological care including his RIGHT hydronephrosis, the duration/necesity of his R PCNU, status of his R subcapsular fluid collection, incontinence, utility of bladder outlet surgery, recurrent UTIs and need for Foley catheter, etc    Will discuss with staff in the morning. Thank you for consulting Urology.     Cristino Martes, MD  Urology Resident  Please page Urology on call with any questions  __________________________________________________________________________________    HPI: Jimmy Waters is a 66 y.o. male with obesity, HTN, HLD, DMII on insulin (A1C 7.2%), metastatic thyroid cancer, hypothyroidism, GERD, arthritis, depression and BPH with urinary retention and recurrent urosepsis, previously managed with a Foley catheter, recent RIGHT ureteral injury s/p nephroureteral drain further complicated by a subcapsular hematoma, now preadmitted to medicine with concern for a complicated UTI ahead of his scheduled 5/4 neck dissection for thyroid cancer.    He has a complicated recent urological history with [facility], summarized below:  He has a history of BPH with catheter dependent urinary retention for which he was planning on undergoing aquablation with Dr. Willaim Bane in late 2022 though had trouble getting to his surgical date due to recurrent urosepsis. In 02/2021 he was noted to have RIGHT sided pain with hydronephrosis to the distal ureter on CT imaging though artifact from his bilateral hip replacements obscured visualization of the distal ureter. He subsequently underwent ureteroscopy of the distal ureter and retrograde pyelogram on 03/15/21 which did not demonstrate a calculus. A 6 Fr x 26 cm ureteral stent was subsequently inserted. He was later rescheduled for R ureteroscopy, aquablation and SPT insertion with Dr. Willaim Bane on 05/18/21 for BPH, urinary retention and possible kidney stone. He later presented on 05/12/21 to the ED with RLQ  abdominal pain and Foley catheter balloon inflated in the distal RIGHT ureter. The Foley was re-positioned and follow-up cystogram demonstrated a small volume bladder with contrast extravasation from the ureter at the previous site of the Foley balloon. A RIGHT nephrostomy tube was inserted for urinary diversion. This admission was complicated by MRSA UTI and bacteremia and at some point his Foley was removed. His R PNCT and ureteral stent was converted to a PCNU on 3/28 which was complicated by an 8.3 cm subcapsular hematoma, AKI and MDR E. Coli urosepsis. He ultimately discharged to inpatient rehab.    Most recently, an outpatient UA attained ahead of his upcoming thyroid cancer surgery was consistent with UTI and he was subsequently directed to the [facility] ED where he was preadmitted to medicine. His only symptom at the time of the UA collection was gross hematuria. He denied any additional LUTS, fever/chills, pain or nausea/vomiting. For the past few months he mostly voids via incontinence. CT AP w/ contrast demonstrates a non-enhancing, 0.8 cm RIGHT subcapsular fluid collection which communicates with a linear hypodensity in the anterior lower pole. His RIGHT PCNU is in good position and there is no hydronephrosis. There is mild thickening of the distal LEFT ureter. Evaluation of his pelvis is limited from artificat from his bilateral hip replacements.    Pertinent Admission Labs:  WBC:  8.7  Hgb:  11.6  Plt:  214  Lactate:  1.4  Cr:  1.20 (baseline 1.0)  UA:  Nitrite(+), packed WBCs, packed RBCs, packed bacteria  Urine cx: Pending  Blood cx: Pending    Medical History:   Diagnosis Date   ? Anxiety disorder    ? Back pain    ? Barrett esophagus    ? BPH (benign prostatic hyperplasia)    ? Cancer of skin    ? Cataract    ? Chronic kidney disease unknown   ? Chronic lower back pain    ? Chronic UTI    ? Degenerative arthritis    ? Depression (disease)    ? Diverticulitis of colon (without mention of hemorrhage)(562.11)     type 2   ? DJD (degenerative joint disease)    ? DM type 2 (diabetes mellitus, type 2) (HCC)    ? Dyslipidemia    ? Erectile dysfunction 2021   ? Falls 2022   ? Gastroparesis    ? Generalized headaches 2022   ? GERD (gastroesophageal reflux disease)    ? Gynecomastia    ? History of dental problems     lower partial   ? History of Nissen fundoplication ~1990   ? HLD (hyperlipidemia)    ? HTN (hypertension)    ? Hx of arthroscopy of knee    ? Hypothyroidism    ? Infection    ? Kidney stones    ? 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 Neuropathy    ? Osteoarthritis    ? Poor balance 2022   ? Shiga toxin-producing Escherichia coli infection    ? Skin cancer    ? Squamous cell carcinoma    ? Stomach disorder    ? Thyroid cancer (HCC) 2009   ? Thyroid nodule 2009, 2017, 2022   ? Unspecified deficiency anemia 2022       Surgical History:   Procedure Laterality Date   ? HX LUMBAR FUSION  1982    L4-5   ? HX THYROIDECTOMY  09/2007   ?  CYSTOURETHROSCOPY  2012   ? KIDNEY STONE SURGERY  2012   ? COLONOSCOPY  02/14/2011   ? HX APPENDECTOMY  02/2011   ? REPEAT LEFT LUMBAR 5 TO SACRAL 1 LAMINOTOMY N/A 01/02/2019    Performed by Karie Chimera, MD at [facility] OR   ? INCISION AND DRAINAGE POSTOPERATIVE WOUND INFECTION - COMPLEX N/A 01/20/2019    Performed by Karie Chimera, MD at [facility] OR   ? NEPHROSTOMY Right 04/2021    in place from right ureter from OSH   ? CATARACT REMOVAL WITH IMPLANT Bilateral    ? CYSTOSCOPY     ? HX HIP REPLACEMENT Bilateral     revision of 1 hip also   ? HX KNEE ARTHROSCOPY     ? HX KNEE REPLACMENT Bilateral    ? HX LAPAROTOMY      possible colon fistula   ? HX LYMPHADENECTOMY     ? HX ROTATOR CUFF REPAIR Bilateral     [id number]   ? HX SHOULDER REPLACEMENT Bilateral    ? HX TONSILLECTOMY     ? HX VASECTOMY     ? PARATHYROID GLAND SURGERY  2009   ? THYROIDECTOMY     ? UPPER GASTROINTESTINAL ENDOSCOPY         Medications:  Scheduled Meds:aspirin chewable tablet 81 mg, 81 mg, Oral, QDAY  atorvastatin (LIPITOR) tablet 20 mg, 20 mg, Oral, QDAY  buPROPion XL (WELLBUTRIN XL) tablet 300 mg, 300 mg, Oral, QDAY  dutasteride (AVODART) capsule 0.5 mg, 0.5 mg, Oral, QDAY  gabapentin (NEURONTIN) capsule 1,200 mg, 1,200 mg, Oral, BID  insulin aspart (U-100) (NOVOLOG FLEXPEN U-100 INSULIN) injection PEN 0-6 Units, 0-6 Units, Subcutaneous, ACHS (22)  insulin glargine (LANTUS SOLOSTAR U-100 INSULIN) injection PEN 20 Units, 20 Units, Subcutaneous, QHS  [START ON 07/24/2021] levothyroxine (SYNTHROID) tablet 25 mcg, 25 mcg, Oral, QDAY(07)  [START ON 07/24/2021] levothyroxine (SYNTHROID) tablet 300 mcg, 300 mcg, Oral, QDAY 30 min before breakfast  losartan (COZAAR) tablet 25 mg, 25 mg, Oral, QDAY  melatonin tablet 5 mg, 5 mg, Oral, QHS  [START ON 07/24/2021] meropenem (MERREM) 1 g in sodium chloride 0.9% (NS) 100 mL IVPB (MB+)(EXTENDED INFUSION), 1 g, Intravenous, Q8H*  metoprolol tartrate (LOPRESSOR) tablet 50 mg, 50 mg, Oral, BID  oxybutynin XL (DITROPAN XL) tablet 15 mg, 15 mg, Oral, QDAY  pantoprazole DR (PROTONIX) tablet 40 mg, 40 mg, Oral, QDAY  polyethylene glycol 3350 (MIRALAX) packet 17 g, 1 packet, Oral, BID  senna (SENOKOT) tablet 1 tablet, 1 tablet, Oral, BID  sertraline (ZOLOFT) tablet 50 mg, 50 mg, Oral, QDAY  tamsulosin (FLOMAX) capsule 0.4 mg, 0.4 mg, Oral, QDAY    Continuous Infusions:  PRN and Respiratory Meds:acetaminophen Q4H PRN, dextrose 50% (D50) IV PRN, melatonin QHS PRN, ondansetron Q6H PRN **OR** ondansetron (ZOFRAN) IV Q6H PRN, oxyCODONE Q6H PRN      Allergies:  Ambien [zolpidem] and Statins-hmg-coa reductase inhibitors    Social History     Socioeconomic History   ? Marital status: Married   Tobacco Use   ? Smoking status: Never   ? Smokeless tobacco: Never   Vaping Use   ? Vaping Use: Never used   Substance and Sexual Activity   ? Alcohol use: Yes     Alcohol/week: 1.0 standard drink     Types: 1 Drinks containing 0.5 oz of alcohol per week     Comment: ~ once a month   ? Drug use: No   ? Sexual activity: Not Currently  Partners: Female     Birth control/protection: None       Family History   Problem Relation Age of Onset   ? Hypertension Mother    ? Cancer-Lung Mother 23   ? Melanoma Mother    ? Cancer Mother         lung   ? Diabetes Mother    ? Heart Disease Mother    ? Hyperlipidemia Mother    ? Thyroid Disease Mother    ? Diabetes Father    ? Arthritis-osteo Sister    ? Hypertension Sister    ? Heart Disease Sister    ? Cancer Sister         lymphoma   ? Diabetes Sister    ? Arthritis Sister    ? Cancer Sister    ? Cancer-Lung Brother 80   ? Hyperlipidemia Brother    ? Hypertension Brother    ? Diabetes Brother    ? Arthritis Brother    ? Cancer Brother         lung   ? Kidney Disease Brother    ? Cancer-Colon Maternal Aunt    ? Cancer Maternal Uncle    ? Unknown to Patient Paternal Aunt    ? Unknown to Patient Paternal Uncle    ? Unknown to Patient Maternal Grandmother    ? Unknown to Patient Maternal Grandfather    ? Unknown to Patient Paternal Grandmother    ? Unknown to Patient Paternal Grandfather    ? Cancer Sister         esophaguse   ? Diabetes Sister    ? Diabetes Brother    ? Heart Disease Sister    ? Obesity Brother        Vitals:  Vital Signs: Last Filed In 24 Hours Vital Signs: 24 Hour Range   BP: 143/74 (04/30 1800)  Temp: 36.3 ?C (97.4 ?F) (04/30 1002)  Pulse: 77 (04/30 1427)  Respirations: 16 PER MINUTE (04/30 1427)  SpO2: 98 % (04/30 1752)  O2 Device: None (Room air) (04/30 1427)  SpO2 Pulse: 72 (04/30 1752)  Height: 182.9 cm (6') (04/30 1002) BP: (132-170)/(74-92)   Temp:  [36.3 ?C (97.4 ?F)]   Pulse:  [77-79]   Respirations:  [16 PER MINUTE-20 PER MINUTE]   SpO2:  [97 %-100 %]   O2 Device: None (Room air)   Intensity Pain Scale (Self Report): 6 (07/23/21 1003)      Intake/Output:  No intake or output data in the 24 hours ending 07/23/21 1954    Physical Exam:   General: Alert, oriented, cooperative, no distress  Head: Normocephalic, without obvious abnormality, atraumatic  Eyes: EOMI, no scleral icterus  Lungs: Unlabored on RA, symmetric chest rise  Heart: HR 70's  Abdomen: Soft, non-tender, non-distended  GU: RIGHT nephrostomy tube draining clear yellow urine. No suprapubic or CVA tenderness  Extremity: No appreciable edema  Neurologic: Grossly intact.     ROS:  A complete review of systems was obtained and was negative except for the symptoms reviewed above.     Lab/Radiology/Other Diagnostic Tests:  Recent Labs     07/23/21  1411 07/23/21  1417   HGB 11.6*  --    HCT 36.0*  --    WBC 8.7  --    PLTCT 214  --    NA 140  --    K 4.0  --    CL 105  --    CO2 24  --  BUN 22  --    CR 1.20 1.2   GLU 143*  --    CA 9.7  --    ALBUMIN 4.1  --    TOTPROT 8.7*  --    TOTBILI 0.4  --    AST 14  --    ALT 9  --    ALKPHOS 119*  --      Glucose: (!) 143 (07/23/21 1411)    Principal Problem:    Complicated UTI (urinary tract infection)

## 2021-07-25 ENCOUNTER — Inpatient Hospital Stay: Admit: 2021-07-25 | Discharge: 2021-07-25 | Payer: MEDICARE

## 2021-07-25 ENCOUNTER — Encounter: Admit: 2021-07-25 | Discharge: 2021-07-25 | Payer: MEDICARE

## 2021-07-25 MED ADMIN — OXYCODONE 5 MG PO TAB [10814]: 10 mg | ORAL | @ 14:00:00 | NDC 00904696661

## 2021-07-25 MED ADMIN — OXYCODONE 10 MG PO TAB [166908]: 10 mg | ORAL | @ 02:00:00 | NDC 00406851023

## 2021-07-25 MED ADMIN — MIDAZOLAM 1 MG/ML IJ SOLN [10607]: 1 mg | INTRAVENOUS | @ 23:00:00 | Stop: 2021-07-25 | NDC 00641605701

## 2021-07-25 MED ADMIN — MIDAZOLAM 1 MG/ML IJ SOLN [10607]: 1 mg | INTRAVENOUS | @ 22:00:00 | Stop: 2021-07-25 | NDC 00641605701

## 2021-07-25 MED ADMIN — FENTANYL CITRATE (PF) 50 MCG/ML IJ SOLN [3037]: 50 ug | INTRAVENOUS | @ 22:00:00 | Stop: 2021-07-25 | NDC 00409909425

## 2021-07-25 MED ADMIN — IOHEXOL 300 MG IODINE/ML IV SOLN [79156]: 150 mL | INTRA_ARTERIAL | Stop: 2021-07-25 | NDC 00407141365

## 2021-07-25 MED ADMIN — FENTANYL CITRATE (PF) 50 MCG/ML IJ SOLN [3037]: 50 ug | INTRAVENOUS | @ 23:00:00 | Stop: 2021-07-25 | NDC 00409909425

## 2021-07-25 MED ADMIN — SERTRALINE 100 MG PO TAB [78401]: 100 mg | ORAL | @ 13:00:00 | NDC 00904692661

## 2021-07-25 MED ADMIN — MEROPENEM 1 GRAM IV SOLR [80713]: 2 g | INTRAVENOUS | @ 13:00:00 | NDC 55150020830

## 2021-07-25 MED ADMIN — OXYCODONE 5 MG PO TAB [10814]: 10 mg | ORAL | @ 18:00:00 | NDC 00904696661

## 2021-07-25 MED ADMIN — MEROPENEM 1 GRAM IV SOLR [80713]: 2 g | INTRAVENOUS | @ 05:00:00 | NDC 55150020830

## 2021-07-25 MED ADMIN — OXYCODONE 10 MG PO TAB [166908]: 10 mg | ORAL | @ 10:00:00 | Stop: 2021-07-25 | NDC 00406851023

## 2021-07-25 MED ADMIN — ACETAMINOPHEN 500 MG PO TAB [102]: 1000 mg | ORAL | @ 12:00:00 | NDC 00904673061

## 2021-07-25 MED ADMIN — MIDAZOLAM 1 MG/ML IJ SOLN [10607]: 1 mg | INTRAVENOUS | @ 21:00:00 | Stop: 2021-07-25 | NDC 72611074101

## 2021-07-25 MED ADMIN — LIDOCAINE 5 % TP PTMD [80759]: 1 | TOPICAL | @ 13:00:00 | NDC 00591352511

## 2021-07-25 MED ADMIN — LEVOTHYROXINE 200 MCG PO TAB [4426]: 325 ug | ORAL | @ 11:00:00 | NDC 68180097509

## 2021-07-25 MED ADMIN — SODIUM CHLORIDE 0.9 % IV SOLP [27838]: 2 g | INTRAVENOUS | @ 05:00:00 | NDC 00338004938

## 2021-07-25 MED ADMIN — LEVOTHYROXINE 125 MCG PO TAB [4424]: 325 ug | ORAL | @ 11:00:00 | NDC 00904695561

## 2021-07-25 MED ADMIN — ENOXAPARIN 40 MG/0.4 ML SC SYRG [85052]: 40 mg | SUBCUTANEOUS | @ 02:00:00 | NDC 00781324602

## 2021-07-25 MED ADMIN — SODIUM CHLORIDE 0.9 % IV SOLP [27838]: 2 g | INTRAVENOUS | @ 13:00:00 | NDC 00338004938

## 2021-07-25 MED ADMIN — POTASSIUM CHLORIDE 20 MEQ PO TBTQ [35943]: 40 meq | ORAL | @ 11:00:00 | Stop: 2021-07-25 | NDC 00832532511

## 2021-07-26 MED ADMIN — POTASSIUM CHLORIDE IN WATER 10 MEQ/50 ML IV PGBK [11075]: 10 meq | INTRAVENOUS | @ 03:00:00 | Stop: 2021-07-26 | NDC 00338070541

## 2021-07-26 MED ADMIN — LIDOCAINE 5 % TP PTMD [80759]: 1 | TOPICAL | @ 13:00:00 | NDC 00591352511

## 2021-07-26 MED ADMIN — HYDROMORPHONE (PF) 2 MG/ML IJ SYRG [163476]: 1 mg | INTRAVENOUS | @ 02:00:00 | Stop: 2021-07-26 | NDC 00409131203

## 2021-07-26 MED ADMIN — ACETAMINOPHEN 500 MG PO TAB [102]: 1000 mg | ORAL | @ 23:00:00 | NDC 00904673061

## 2021-07-26 MED ADMIN — SODIUM CHLORIDE 0.9 % IV SOLP [27838]: 2 g | INTRAVENOUS | @ 03:00:00 | NDC 00338004938

## 2021-07-26 MED ADMIN — SODIUM CHLORIDE 0.9 % IV SOLP [27838]: 2 g | INTRAVENOUS | @ 11:00:00 | NDC 00338004938

## 2021-07-26 MED ADMIN — LACTATED RINGERS IV SOLP [4318]: 1000.000 mL | INTRAVENOUS | @ 23:00:00 | NDC 00338011704

## 2021-07-26 MED ADMIN — SODIUM CHLORIDE 0.9 % IV SOLP [27838]: 2 g | INTRAVENOUS | @ 21:00:00 | NDC 00338004938

## 2021-07-26 MED ADMIN — OXYCODONE 5 MG PO TAB [10814]: 10 mg | ORAL | @ 13:00:00 | NDC 00904696661

## 2021-07-26 MED ADMIN — PROCHLORPERAZINE EDISYLATE 5 MG/ML IJ SOLN [6580]: 5 mg | INTRAVENOUS | @ 03:00:00 | Stop: 2021-07-26 | NDC 23155029431

## 2021-07-26 MED ADMIN — ONDANSETRON HCL (PF) 4 MG/2 ML IJ SOLN [136012]: 4 mg | INTRAVENOUS | @ 01:00:00 | Stop: 2021-07-26 | NDC 72266012301

## 2021-07-26 MED ADMIN — MEROPENEM 1 GRAM IV SOLR [80713]: 2 g | INTRAVENOUS | @ 11:00:00 | NDC 55150020830

## 2021-07-26 MED ADMIN — OXYCODONE 5 MG PO TAB [10814]: 5 mg | ORAL | @ 23:00:00 | NDC 00904696661

## 2021-07-26 MED ADMIN — SERTRALINE 50 MG PO TAB [82509]: 100 mg | ORAL | @ 13:00:00 | NDC 00904692561

## 2021-07-26 MED ADMIN — MEROPENEM 1 GRAM IV SOLR [80713]: 2 g | INTRAVENOUS | @ 03:00:00 | NDC 55150020830

## 2021-07-26 MED ADMIN — ACETAMINOPHEN 500 MG PO TAB [102]: 1000 mg | ORAL | @ 16:00:00 | NDC 00904673061

## 2021-07-26 MED ADMIN — MEROPENEM 1 GRAM IV SOLR [80713]: 2 g | INTRAVENOUS | @ 21:00:00 | NDC 55150020830

## 2021-07-27 ENCOUNTER — Encounter: Admit: 2021-07-27 | Discharge: 2021-07-27 | Payer: MEDICARE

## 2021-07-27 DIAGNOSIS — G629 Polyneuropathy, unspecified: Secondary | ICD-10-CM

## 2021-07-27 DIAGNOSIS — M545 Chronic lower back pain: Secondary | ICD-10-CM

## 2021-07-27 DIAGNOSIS — K3184 Gastroparesis: Secondary | ICD-10-CM

## 2021-07-27 DIAGNOSIS — W19XXXA Unspecified fall, initial encounter: Secondary | ICD-10-CM

## 2021-07-27 DIAGNOSIS — R519 Generalized headaches: Secondary | ICD-10-CM

## 2021-07-27 DIAGNOSIS — Z9889 Other specified postprocedural states: Secondary | ICD-10-CM

## 2021-07-27 DIAGNOSIS — N62 Hypertrophy of breast: Secondary | ICD-10-CM

## 2021-07-27 DIAGNOSIS — N400 Enlarged prostate without lower urinary tract symptoms: Secondary | ICD-10-CM

## 2021-07-27 DIAGNOSIS — IMO0002 Squamous cell carcinoma: Secondary | ICD-10-CM

## 2021-07-27 DIAGNOSIS — N529 Male erectile dysfunction, unspecified: Secondary | ICD-10-CM

## 2021-07-27 DIAGNOSIS — K5732 Diverticulitis of large intestine without perforation or abscess without bleeding: Secondary | ICD-10-CM

## 2021-07-27 DIAGNOSIS — N200 Calculus of kidney: Secondary | ICD-10-CM

## 2021-07-27 DIAGNOSIS — B999 Unspecified infectious disease: Secondary | ICD-10-CM

## 2021-07-27 DIAGNOSIS — E3120 Multiple endocrine neoplasia [MEN] syndrome, unspecified: Secondary | ICD-10-CM

## 2021-07-27 DIAGNOSIS — K219 Gastro-esophageal reflux disease without esophagitis: Secondary | ICD-10-CM

## 2021-07-27 DIAGNOSIS — D539 Nutritional anemia, unspecified: Secondary | ICD-10-CM

## 2021-07-27 DIAGNOSIS — K319 Disease of stomach and duodenum, unspecified: Secondary | ICD-10-CM

## 2021-07-27 DIAGNOSIS — F419 Anxiety disorder, unspecified: Secondary | ICD-10-CM

## 2021-07-27 DIAGNOSIS — K2270 Barrett's esophagus without dysplasia: Secondary | ICD-10-CM

## 2021-07-27 DIAGNOSIS — N189 Chronic kidney disease, unspecified: Secondary | ICD-10-CM

## 2021-07-27 DIAGNOSIS — N390 Urinary tract infection, site not specified: Secondary | ICD-10-CM

## 2021-07-27 DIAGNOSIS — E785 Hyperlipidemia, unspecified: Secondary | ICD-10-CM

## 2021-07-27 DIAGNOSIS — E119 Type 2 diabetes mellitus without complications: Secondary | ICD-10-CM

## 2021-07-27 DIAGNOSIS — I10 Essential (primary) hypertension: Secondary | ICD-10-CM

## 2021-07-27 DIAGNOSIS — R2689 Other abnormalities of gait and mobility: Secondary | ICD-10-CM

## 2021-07-27 DIAGNOSIS — C4490 Unspecified malignant neoplasm of skin, unspecified: Secondary | ICD-10-CM

## 2021-07-27 DIAGNOSIS — E041 Nontoxic single thyroid nodule: Secondary | ICD-10-CM

## 2021-07-27 DIAGNOSIS — A498 Other bacterial infections of unspecified site: Secondary | ICD-10-CM

## 2021-07-27 DIAGNOSIS — Z8719 Personal history of other diseases of the digestive system: Secondary | ICD-10-CM

## 2021-07-27 DIAGNOSIS — C73 Malignant neoplasm of thyroid gland: Secondary | ICD-10-CM

## 2021-07-27 DIAGNOSIS — M549 Dorsalgia, unspecified: Secondary | ICD-10-CM

## 2021-07-27 DIAGNOSIS — E039 Hypothyroidism, unspecified: Secondary | ICD-10-CM

## 2021-07-27 DIAGNOSIS — M1990 Unspecified osteoarthritis, unspecified site: Secondary | ICD-10-CM

## 2021-07-27 MED ADMIN — SODIUM CHLORIDE 0.9 % IV SOLP [27838]: 1000.000 mL | INTRAVENOUS | @ 13:00:00 | Stop: 2021-07-29 | NDC 00338004904

## 2021-07-27 MED ADMIN — MEROPENEM 1 GRAM IV SOLR [80713]: 2 g | INTRAVENOUS | @ 11:00:00 | Stop: 2021-07-27 | NDC 55150020830

## 2021-07-27 MED ADMIN — FENTANYL CITRATE (PF) 50 MCG/ML IJ SOLN [3037]: 25 ug | INTRAVENOUS | @ 16:00:00 | Stop: 2021-07-27 | NDC 00409909412

## 2021-07-27 MED ADMIN — PHENYLEPHRINE HCL 10 MG/ML IJ SOLN [6242]: .3 ug/kg/min | INTRAVENOUS | @ 13:00:00 | Stop: 2021-07-27 | NDC 70121157701

## 2021-07-27 MED ADMIN — FENTANYL CITRATE (PF) 50 MCG/ML IJ SOLN [3037]: 50 ug | INTRAVENOUS | @ 13:00:00 | Stop: 2021-07-27 | NDC 00409909425

## 2021-07-27 MED ADMIN — EPHEDRINE SULFATE 50 MG/ML IV SOLN [329957]: 10 mg | INTRAVENOUS | @ 14:00:00 | Stop: 2021-07-27 | NDC 76014000530

## 2021-07-27 MED ADMIN — LIDOCAINE-EPINEPHRINE 1 %-1:100,000 IJ SOLN [15955]: 10 mL | INTRAMUSCULAR | @ 14:00:00 | Stop: 2021-07-27 | NDC 00409317817

## 2021-07-27 MED ADMIN — FENTANYL CITRATE (PF) 50 MCG/ML IJ SOLN [3037]: 25 ug | INTRAVENOUS | @ 15:00:00 | Stop: 2021-07-27 | NDC 00409909412

## 2021-07-27 MED ADMIN — LEVOTHYROXINE 150 MCG PO TAB [4425]: 325 ug | ORAL | @ 11:00:00 | NDC 00904695661

## 2021-07-27 MED ADMIN — BACITRACIN ZINC 500 UNIT/GRAM TP OINT [13818]: 1 | TOPICAL | @ 14:00:00 | Stop: 2021-07-27 | NDC 14428000888

## 2021-07-27 MED ADMIN — ONDANSETRON HCL (PF) 4 MG/2 ML IJ SOLN [136012]: 4 mg | INTRAVENOUS | @ 14:00:00 | Stop: 2021-07-27 | NDC 25021077702

## 2021-07-27 MED ADMIN — PROPOFOL INJ 10 MG/ML IV VIAL [210331]: 50 mg | INTRAVENOUS | @ 13:00:00 | Stop: 2021-07-27 | NDC 25021060820

## 2021-07-27 MED ADMIN — SUCCINYLCHOLINE CHLORIDE 20 MG/ML IJ SOLN [7536]: 100 mg | INTRAVENOUS | @ 13:00:00 | Stop: 2021-07-27 | NDC 00409662912

## 2021-07-27 MED ADMIN — PHENYLEPHRINE HCL IN 0.9% NACL 1 MG/10 ML (100 MCG/ML) IV SYRG [303060]: 200 ug | INTRAVENOUS | @ 13:00:00 | Stop: 2021-07-27 | NDC 69374095710

## 2021-07-27 MED ADMIN — PHENYLEPHRINE HCL IN 0.9% NACL 1 MG/10 ML (100 MCG/ML) IV SYRG [303060]: 100 ug | INTRAVENOUS | @ 13:00:00 | Stop: 2021-07-27 | NDC 69374095710

## 2021-07-27 MED ADMIN — SODIUM CHLORIDE 0.9 % IV SOLP [27838]: .3 ug/kg/min | INTRAVENOUS | @ 13:00:00 | Stop: 2021-07-27 | NDC 00338004902

## 2021-07-27 MED ADMIN — LEVOTHYROXINE 25 MCG PO TAB [4420]: 325 ug | ORAL | @ 11:00:00 | NDC 00904694961

## 2021-07-27 MED ADMIN — SODIUM CHLORIDE 0.9 % IV SOLP [27838]: 2 g | INTRAVENOUS | @ 11:00:00 | Stop: 2021-07-27 | NDC 00338004918

## 2021-07-27 MED ADMIN — OXYCODONE 5 MG PO TAB [10814]: 10 mg | ORAL | @ 15:00:00 | Stop: 2021-07-27 | NDC 00904696661

## 2021-07-27 MED ADMIN — LIDOCAINE (PF) 200 MG/10 ML (2 %) IJ SYRG [307651]: 80 mg | INTRAVENOUS | @ 13:00:00 | Stop: 2021-07-27 | NDC 70092156446

## 2021-07-27 MED ADMIN — ACETAMINOPHEN 500 MG PO TAB [102]: 1000 mg | ORAL | @ 11:00:00 | NDC 00904673061

## 2021-07-27 MED ADMIN — REMIFENTANIL 1 MG IV SOLR [77243]: .1 ug/kg/min | INTRAVENOUS | @ 13:00:00 | Stop: 2021-07-27 | NDC 67457019800

## 2021-07-27 MED ADMIN — ARTIFICIAL TEARS (PF) SINGLE DOSE DROPS GROUP [280009]: 2 [drp] | OPHTHALMIC | @ 13:00:00 | Stop: 2021-07-27 | NDC 00065806301

## 2021-07-27 MED ADMIN — SODIUM CHLORIDE 0.9 % IV SOLP [27838]: .1 ug/kg/min | INTRAVENOUS | @ 13:00:00 | Stop: 2021-07-27 | NDC 00264999900

## 2021-07-27 MED ADMIN — CEFAZOLIN 1 GRAM IJ SOLR [1445]: 2 g | INTRAVENOUS | @ 13:00:00 | Stop: 2021-07-27 | NDC 00143992490

## 2021-07-27 MED ADMIN — PROPOFOL 10 MG/ML IV EMUL [11150]: 140 ug/kg/min | INTRAVENOUS | @ 13:00:00 | Stop: 2021-07-27 | NDC 63323026965

## 2021-07-27 MED ADMIN — OXYCODONE 5 MG PO TAB [10814]: 5 mg | ORAL | @ 03:00:00 | NDC 00904696661

## 2021-07-27 MED ADMIN — PROPOFOL INJ 10 MG/ML IV VIAL [210331]: 150 mg | INTRAVENOUS | @ 13:00:00 | Stop: 2021-07-27 | NDC 25021060820

## 2021-07-27 MED ADMIN — MEROPENEM 1 GRAM IV SOLR [80713]: 2 g | INTRAVENOUS | @ 03:00:00 | NDC 55150020830

## 2021-07-27 MED ADMIN — ACETAMINOPHEN 500 MG PO TAB [102]: 1000 mg | ORAL | @ 17:00:00 | NDC 00904673061

## 2021-07-27 MED ADMIN — ACETAMINOPHEN 500 MG PO TAB [102]: 1000 mg | ORAL | @ 22:00:00 | NDC 00904673061

## 2021-07-27 MED ADMIN — PROPOFOL INJ 10 MG/ML IV VIAL [210331]: 20 mg | INTRAVENOUS | @ 14:00:00 | Stop: 2021-07-27 | NDC 25021060820

## 2021-07-27 MED ADMIN — OXYCODONE 5 MG PO TAB [10814]: 5 mg | ORAL | @ 22:00:00 | NDC 00904696661

## 2021-07-27 MED ADMIN — SODIUM CHLORIDE 0.9 % IV SOLP [27838]: 2 g | INTRAVENOUS | @ 03:00:00 | NDC 00338004948

## 2021-07-27 MED ADMIN — SODIUM CHLORIDE 0.9 % FLUSH [210767]: 10 mL | @ 04:00:00 | NDC 08290306546

## 2021-07-27 MED ADMIN — ACETAMINOPHEN 500 MG PO TAB [102]: 1000 mg | ORAL | @ 04:00:00 | NDC 00904673061

## 2021-07-27 MED ADMIN — HYDROMORPHONE (PF) 2 MG/ML IJ SYRG [163476]: 0.5 mg | INTRAVENOUS | @ 15:00:00 | Stop: 2021-07-27 | NDC 00409131203

## 2021-07-28 MED ADMIN — MEROPENEM 1 GRAM IV SOLR [80713]: 2 g | INTRAVENOUS | @ 21:00:00 | NDC 55150020830

## 2021-07-28 MED ADMIN — MEROPENEM 1 GRAM IV SOLR [80713]: 2 g | INTRAVENOUS | @ 14:00:00 | NDC 55150020830

## 2021-07-28 MED ADMIN — ACETAMINOPHEN 500 MG PO TAB [102]: 1000 mg | ORAL | @ 05:00:00 | NDC 00904673061

## 2021-07-28 MED ADMIN — OXYCODONE 5 MG PO TAB [10814]: 10 mg | ORAL | @ 02:00:00 | NDC 00904696661

## 2021-07-28 MED ADMIN — GABAPENTIN 300 MG PO CAP [18308]: 600 mg | ORAL | @ 15:00:00 | NDC 67877022305

## 2021-07-28 MED ADMIN — MEROPENEM 1 GRAM IV SOLR [80713]: 2 g | INTRAVENOUS | @ 05:00:00 | NDC 55150020830

## 2021-07-28 MED ADMIN — SODIUM CHLORIDE 0.9 % IV SOLP [27838]: 2 g | INTRAVENOUS | @ 05:00:00 | NDC 00338004938

## 2021-07-28 MED ADMIN — SODIUM CHLORIDE 0.9 % FLUSH [210767]: 10 mL | @ 03:00:00 | NDC 08290306546

## 2021-07-28 MED ADMIN — LEVOTHYROXINE 150 MCG PO TAB [4425]: 325 ug | ORAL | @ 11:00:00 | NDC 00904695661

## 2021-07-28 MED ADMIN — ACETAMINOPHEN 500 MG PO TAB [102]: 1000 mg | ORAL | @ 11:00:00 | NDC 00904673061

## 2021-07-28 MED ADMIN — GABAPENTIN 300 MG PO CAP [18308]: 600 mg | ORAL | @ 02:00:00 | NDC 67877022305

## 2021-07-28 MED ADMIN — ACETAMINOPHEN 500 MG PO TAB [102]: 1000 mg | ORAL | @ 16:00:00 | NDC 00904673061

## 2021-07-28 MED ADMIN — SERTRALINE 50 MG PO TAB [82509]: 100 mg | ORAL | @ 15:00:00 | NDC 00904692561

## 2021-07-28 MED ADMIN — LEVOTHYROXINE 25 MCG PO TAB [4420]: 325 ug | ORAL | @ 11:00:00 | NDC 00904694961

## 2021-07-28 MED ADMIN — SODIUM CHLORIDE 0.9 % IV SOLP [27838]: 2 g | INTRAVENOUS | @ 21:00:00 | NDC 00338004938

## 2021-07-28 MED ADMIN — SODIUM CHLORIDE 0.9 % IV SOLP [27838]: 2 g | INTRAVENOUS | @ 14:00:00 | NDC 00338004938

## 2021-07-28 MED ADMIN — ACETAMINOPHEN 500 MG PO TAB [102]: 1000 mg | ORAL | @ 23:00:00 | NDC 00904673061

## 2021-07-28 MED ADMIN — OXYCODONE 5 MG PO TAB [10814]: 5 mg | ORAL | @ 23:00:00 | NDC 00904696661

## 2021-07-29 ENCOUNTER — Encounter: Admit: 2021-07-29 | Discharge: 2021-07-29 | Payer: MEDICARE

## 2021-07-29 DIAGNOSIS — R2689 Other abnormalities of gait and mobility: Secondary | ICD-10-CM

## 2021-07-29 DIAGNOSIS — C4490 Unspecified malignant neoplasm of skin, unspecified: Secondary | ICD-10-CM

## 2021-07-29 DIAGNOSIS — N529 Male erectile dysfunction, unspecified: Secondary | ICD-10-CM

## 2021-07-29 DIAGNOSIS — E119 Type 2 diabetes mellitus without complications: Secondary | ICD-10-CM

## 2021-07-29 DIAGNOSIS — M1990 Unspecified osteoarthritis, unspecified site: Secondary | ICD-10-CM

## 2021-07-29 DIAGNOSIS — K3184 Gastroparesis: Secondary | ICD-10-CM

## 2021-07-29 DIAGNOSIS — G629 Polyneuropathy, unspecified: Secondary | ICD-10-CM

## 2021-07-29 DIAGNOSIS — C73 Malignant neoplasm of thyroid gland: Secondary | ICD-10-CM

## 2021-07-29 DIAGNOSIS — Z8719 Personal history of other diseases of the digestive system: Secondary | ICD-10-CM

## 2021-07-29 DIAGNOSIS — F419 Anxiety disorder, unspecified: Secondary | ICD-10-CM

## 2021-07-29 DIAGNOSIS — B999 Unspecified infectious disease: Secondary | ICD-10-CM

## 2021-07-29 DIAGNOSIS — D539 Nutritional anemia, unspecified: Secondary | ICD-10-CM

## 2021-07-29 DIAGNOSIS — E041 Nontoxic single thyroid nodule: Secondary | ICD-10-CM

## 2021-07-29 DIAGNOSIS — E3120 Multiple endocrine neoplasia [MEN] syndrome, unspecified: Secondary | ICD-10-CM

## 2021-07-29 DIAGNOSIS — N390 Urinary tract infection, site not specified: Secondary | ICD-10-CM

## 2021-07-29 DIAGNOSIS — W19XXXA Unspecified fall, initial encounter: Secondary | ICD-10-CM

## 2021-07-29 DIAGNOSIS — K219 Gastro-esophageal reflux disease without esophagitis: Secondary | ICD-10-CM

## 2021-07-29 DIAGNOSIS — N400 Enlarged prostate without lower urinary tract symptoms: Secondary | ICD-10-CM

## 2021-07-29 DIAGNOSIS — Z9889 Other specified postprocedural states: Secondary | ICD-10-CM

## 2021-07-29 DIAGNOSIS — N189 Chronic kidney disease, unspecified: Secondary | ICD-10-CM

## 2021-07-29 DIAGNOSIS — N62 Hypertrophy of breast: Secondary | ICD-10-CM

## 2021-07-29 DIAGNOSIS — M545 Chronic lower back pain: Secondary | ICD-10-CM

## 2021-07-29 DIAGNOSIS — E039 Hypothyroidism, unspecified: Secondary | ICD-10-CM

## 2021-07-29 DIAGNOSIS — IMO0002 Squamous cell carcinoma: Secondary | ICD-10-CM

## 2021-07-29 DIAGNOSIS — R519 Generalized headaches: Secondary | ICD-10-CM

## 2021-07-29 DIAGNOSIS — A498 Other bacterial infections of unspecified site: Secondary | ICD-10-CM

## 2021-07-29 DIAGNOSIS — I10 Essential (primary) hypertension: Secondary | ICD-10-CM

## 2021-07-29 DIAGNOSIS — K319 Disease of stomach and duodenum, unspecified: Secondary | ICD-10-CM

## 2021-07-29 DIAGNOSIS — K5732 Diverticulitis of large intestine without perforation or abscess without bleeding: Secondary | ICD-10-CM

## 2021-07-29 DIAGNOSIS — K2270 Barrett's esophagus without dysplasia: Secondary | ICD-10-CM

## 2021-07-29 DIAGNOSIS — E785 Hyperlipidemia, unspecified: Secondary | ICD-10-CM

## 2021-07-29 DIAGNOSIS — N200 Calculus of kidney: Secondary | ICD-10-CM

## 2021-07-29 DIAGNOSIS — M549 Dorsalgia, unspecified: Secondary | ICD-10-CM

## 2021-07-29 MED ADMIN — SODIUM CHLORIDE 0.9 % FLUSH [210767]: 10 mL | @ 02:00:00 | NDC 08290306546

## 2021-07-29 MED ADMIN — BUPROPION XL 300 MG PO TB24 [88618]: 300 mg | ORAL | @ 15:00:00 | NDC 00904657304

## 2021-07-29 MED ADMIN — ONDANSETRON HCL (PF) 4 MG/2 ML IJ SOLN [136012]: 8 mg | INTRAVENOUS | @ 21:00:00 | NDC 72266012301

## 2021-07-29 MED ADMIN — LEVOTHYROXINE 150 MCG PO TAB [4425]: 325 ug | ORAL | @ 11:00:00 | NDC 00904695661

## 2021-07-29 MED ADMIN — ACETAMINOPHEN 500 MG PO TAB [102]: 1000 mg | ORAL | @ 22:00:00 | NDC 00904673061

## 2021-07-29 MED ADMIN — POTASSIUM CHLORIDE 20 MEQ PO TBTQ [35943]: 30 meq | ORAL | @ 15:00:00 | Stop: 2021-07-29 | NDC 00832532511

## 2021-07-29 MED ADMIN — GABAPENTIN 300 MG PO CAP [18308]: 600 mg | ORAL | @ 15:00:00 | NDC 67877022305

## 2021-07-29 MED ADMIN — SODIUM CHLORIDE 0.9 % IV SOLP [27838]: 2 g | INTRAVENOUS | @ 06:00:00 | NDC 00338004938

## 2021-07-29 MED ADMIN — GABAPENTIN 300 MG PO CAP [18308]: 600 mg | ORAL | @ 02:00:00 | NDC 67877022305

## 2021-07-29 MED ADMIN — MEROPENEM 1 GRAM IV SOLR [80713]: 2 g | INTRAVENOUS | @ 16:00:00 | NDC 55150020830

## 2021-07-29 MED ADMIN — MEROPENEM 1 GRAM IV SOLR [80713]: 2 g | INTRAVENOUS | @ 22:00:00 | NDC 55150020830

## 2021-07-29 MED ADMIN — HEPARIN, PORCINE (PF) 5,000 UNIT/0.5 ML IJ SYRG [95535]: 5000 [IU] | SUBCUTANEOUS | @ 02:00:00 | NDC 00409131611

## 2021-07-29 MED ADMIN — OXYCODONE 10 MG PO TAB [166908]: 10 mg | ORAL | @ 15:00:00 | NDC 68084096811

## 2021-07-29 MED ADMIN — ACETAMINOPHEN 500 MG PO TAB [102]: 1000 mg | ORAL | @ 15:00:00 | NDC 00904673061

## 2021-07-29 MED ADMIN — LEVOTHYROXINE 25 MCG PO TAB [4420]: 325 ug | ORAL | @ 11:00:00 | NDC 00904694961

## 2021-07-29 MED ADMIN — HEPARIN, PORCINE (PF) 5,000 UNIT/0.5 ML IJ SYRG [95535]: 5000 [IU] | SUBCUTANEOUS | @ 21:00:00 | NDC 00409131611

## 2021-07-29 MED ADMIN — LIDOCAINE 5 % TP PTMD [80759]: 1 | TOPICAL | @ 18:00:00 | NDC 00591352511

## 2021-07-29 MED ADMIN — SODIUM CHLORIDE 0.9 % IV SOLP [27838]: 2 g | INTRAVENOUS | @ 16:00:00 | NDC 00338004938

## 2021-07-29 MED ADMIN — MEROPENEM 1 GRAM IV SOLR [80713]: 2 g | INTRAVENOUS | @ 06:00:00 | NDC 55150020830

## 2021-07-29 MED ADMIN — SODIUM CHLORIDE 0.9 % FLUSH [210767]: 10 mL | @ 16:00:00 | NDC 08290306546

## 2021-07-29 MED ADMIN — OXYCODONE 5 MG PO TAB [10814]: 5 mg | ORAL | @ 10:00:00 | Stop: 2021-07-29 | NDC 00904696661

## 2021-07-29 MED ADMIN — SERTRALINE 50 MG PO TAB [82509]: 100 mg | ORAL | @ 15:00:00 | NDC 00904692561

## 2021-07-29 MED ADMIN — POTASSIUM CHLORIDE 10 MEQ PO TBTQ [35942]: 30 meq | ORAL | @ 15:00:00 | Stop: 2021-07-29 | NDC 00245531789

## 2021-07-29 MED ADMIN — SODIUM CHLORIDE 0.9 % IV SOLP [27838]: 2 g | INTRAVENOUS | @ 22:00:00 | NDC 00338004938

## 2021-07-29 MED ADMIN — OXYCODONE 5 MG PO TAB [10814]: 10 mg | ORAL | @ 21:00:00 | NDC 68084035411

## 2021-07-29 MED ADMIN — HEPARIN, PORCINE (PF) 5,000 UNIT/0.5 ML IJ SYRG [95535]: 5000 [IU] | SUBCUTANEOUS | @ 11:00:00 | NDC 00409131611

## 2021-07-29 MED ADMIN — OXYCODONE 5 MG PO TAB [10814]: 5 mg | ORAL | @ 02:00:00 | NDC 00904696661

## 2021-07-30 MED ADMIN — LIDOCAINE 5 % TP PTMD [80759]: 1 | TOPICAL | @ 14:00:00 | NDC 00591352511

## 2021-07-30 MED ADMIN — SODIUM CHLORIDE 0.9 % IV SOLP [27838]: 2 g | INTRAVENOUS | @ 06:00:00 | Stop: 2021-08-02 | NDC 00338004938

## 2021-07-30 MED ADMIN — SERTRALINE 50 MG PO TAB [82509]: 100 mg | ORAL | @ 14:00:00 | NDC 00904692561

## 2021-07-30 MED ADMIN — MEROPENEM 1 GRAM IV SOLR [80713]: 2 g | INTRAVENOUS | @ 14:00:00 | Stop: 2021-08-02 | NDC 55150020830

## 2021-07-30 MED ADMIN — HEPARIN, PORCINE (PF) 5,000 UNIT/0.5 ML IJ SYRG [95535]: 5000 [IU] | SUBCUTANEOUS | @ 12:00:00 | NDC 00409131611

## 2021-07-30 MED ADMIN — SODIUM CHLORIDE 0.9 % FLUSH [210767]: 10 mL | @ 02:00:00 | NDC 08290306546

## 2021-07-30 MED ADMIN — HEPARIN, PORCINE (PF) 5,000 UNIT/0.5 ML IJ SYRG [95535]: 5000 [IU] | SUBCUTANEOUS | @ 02:00:00 | NDC 00409131611

## 2021-07-30 MED ADMIN — MEROPENEM 1 GRAM IV SOLR [80713]: 2 g | INTRAVENOUS | @ 06:00:00 | Stop: 2021-08-02 | NDC 55150020830

## 2021-07-30 MED ADMIN — LOSARTAN 25 MG PO TAB [80886]: 25 mg | ORAL | @ 16:00:00 | NDC 00904704761

## 2021-07-30 MED ADMIN — ACETAMINOPHEN 500 MG PO TAB [102]: 1000 mg | ORAL | @ 16:00:00 | NDC 00904673061

## 2021-07-30 MED ADMIN — SODIUM CHLORIDE 0.9 % IV SOLP [27838]: 2 g | INTRAVENOUS | @ 14:00:00 | Stop: 2021-08-02 | NDC 00338004938

## 2021-07-30 MED ADMIN — ONDANSETRON 8 MG PO TBDI [79332]: 8 mg | ORAL | @ 14:00:00 | NDC 65862039110

## 2021-07-30 MED ADMIN — OXYCODONE 10 MG PO TAB [166908]: 10 mg | ORAL | @ 12:00:00 | NDC 68084096811

## 2021-07-30 MED ADMIN — ONDANSETRON 8 MG PO TBDI [79332]: 8 mg | ORAL | @ 08:00:00 | NDC 65862039110

## 2021-07-30 MED ADMIN — MEROPENEM 1 GRAM IV SOLR [80713]: 2 g | INTRAVENOUS | @ 22:00:00 | Stop: 2021-08-02 | NDC 55150020830

## 2021-07-30 MED ADMIN — OXYCODONE 5 MG PO TAB [10814]: 10 mg | ORAL | @ 18:00:00 | NDC 68084035411

## 2021-07-30 MED ADMIN — BUPROPION XL 300 MG PO TB24 [88618]: 300 mg | ORAL | @ 14:00:00 | NDC 00904657304

## 2021-07-30 MED ADMIN — LEVOTHYROXINE 150 MCG PO TAB [4425]: 325 ug | ORAL | @ 12:00:00 | NDC 00904695661

## 2021-07-30 MED ADMIN — SODIUM CHLORIDE 0.9 % IV SOLP [27838]: 2 g | INTRAVENOUS | @ 22:00:00 | Stop: 2021-08-02 | NDC 00338004938

## 2021-07-30 MED ADMIN — HEPARIN, PORCINE (PF) 5,000 UNIT/0.5 ML IJ SYRG [95535]: 5000 [IU] | SUBCUTANEOUS | @ 19:00:00 | NDC 00409131611

## 2021-07-30 MED ADMIN — OXYCODONE 10 MG PO TAB [166908]: 10 mg | ORAL | @ 06:00:00 | NDC 68084096811

## 2021-07-30 MED ADMIN — LEVOTHYROXINE 25 MCG PO TAB [4420]: 325 ug | ORAL | @ 12:00:00 | NDC 00904694961

## 2021-07-30 MED ADMIN — GABAPENTIN 300 MG PO CAP [18308]: 600 mg | ORAL | @ 02:00:00 | NDC 67877022305

## 2021-07-30 MED ADMIN — GABAPENTIN 300 MG PO CAP [18308]: 600 mg | ORAL | @ 14:00:00 | NDC 67877022305

## 2021-07-31 MED ADMIN — OXYCODONE 5 MG PO TAB [10814]: 10 mg | ORAL | @ 14:00:00 | NDC 68084035411

## 2021-07-31 MED ADMIN — SODIUM CHLORIDE 0.9 % IV SOLP [27838]: 2 g | INTRAVENOUS | @ 14:00:00 | Stop: 2021-08-02 | NDC 00338004938

## 2021-07-31 MED ADMIN — GABAPENTIN 300 MG PO CAP [18308]: 600 mg | ORAL | @ 02:00:00 | NDC 67877022305

## 2021-07-31 MED ADMIN — MEROPENEM 1 GRAM IV SOLR [80713]: 2 g | INTRAVENOUS | @ 05:00:00 | Stop: 2021-08-02 | NDC 55150020830

## 2021-07-31 MED ADMIN — HEPARIN, PORCINE (PF) 5,000 UNIT/0.5 ML IJ SYRG [95535]: 5000 [IU] | SUBCUTANEOUS | @ 21:00:00 | NDC 00409131611

## 2021-07-31 MED ADMIN — BUPROPION XL 300 MG PO TB24 [88618]: 300 mg | ORAL | @ 17:00:00 | NDC 00904657304

## 2021-07-31 MED ADMIN — MEROPENEM 1 GRAM IV SOLR [80713]: 2 g | INTRAVENOUS | @ 14:00:00 | Stop: 2021-08-02 | NDC 55150020830

## 2021-07-31 MED ADMIN — ONDANSETRON HCL (PF) 4 MG/2 ML IJ SOLN [136012]: 8 mg | INTRAVENOUS | @ 15:00:00 | NDC 72266012301

## 2021-07-31 MED ADMIN — MEROPENEM 1 GRAM IV SOLR [80713]: 2 g | INTRAVENOUS | @ 22:00:00 | Stop: 2021-08-02 | NDC 55150020830

## 2021-07-31 MED ADMIN — SODIUM CHLORIDE 0.9 % IV SOLP [27838]: 2 g | INTRAVENOUS | @ 05:00:00 | Stop: 2021-08-02 | NDC 00338004938

## 2021-07-31 MED ADMIN — LOSARTAN 25 MG PO TAB [80886]: 25 mg | ORAL | @ 14:00:00 | NDC 00904704761

## 2021-07-31 MED ADMIN — HEPARIN, PORCINE (PF) 5,000 UNIT/0.5 ML IJ SYRG [95535]: 5000 [IU] | SUBCUTANEOUS | @ 02:00:00 | NDC 00409131611

## 2021-07-31 MED ADMIN — OXYCODONE 5 MG PO TAB [10814]: 10 mg | ORAL | @ 02:00:00 | NDC 68084035411

## 2021-07-31 MED ADMIN — LEVOTHYROXINE 25 MCG PO TAB [4420]: 325 ug | ORAL | @ 11:00:00 | NDC 00904694961

## 2021-07-31 MED ADMIN — OXYCODONE 5 MG PO TAB [10814]: 10 mg | ORAL | @ 21:00:00 | NDC 68084035411

## 2021-07-31 MED ADMIN — HEPARIN, PORCINE (PF) 5,000 UNIT/0.5 ML IJ SYRG [95535]: 5000 [IU] | SUBCUTANEOUS | @ 11:00:00 | NDC 00409131611

## 2021-07-31 MED ADMIN — SODIUM CHLORIDE 0.9 % FLUSH [210767]: 10 mL | @ 02:00:00 | NDC 08290306546

## 2021-07-31 MED ADMIN — ACETAMINOPHEN 500 MG PO TAB [102]: 1000 mg | ORAL | @ 22:00:00 | NDC 00904673061

## 2021-07-31 MED ADMIN — ONDANSETRON HCL (PF) 4 MG/2 ML IJ SOLN [136012]: 8 mg | INTRAVENOUS | @ 21:00:00 | NDC 72266012301

## 2021-07-31 MED ADMIN — SODIUM CHLORIDE 0.9 % IV SOLP [27838]: 2 g | INTRAVENOUS | @ 22:00:00 | Stop: 2021-08-02 | NDC 00338004938

## 2021-07-31 MED ADMIN — SERTRALINE 50 MG PO TAB [82509]: 100 mg | ORAL | @ 14:00:00 | NDC 00904692561

## 2021-07-31 MED ADMIN — ONDANSETRON 8 MG PO TBDI [79332]: 8 mg | ORAL | @ 02:00:00 | NDC 65862039110

## 2021-07-31 MED ADMIN — LEVOTHYROXINE 150 MCG PO TAB [4425]: 325 ug | ORAL | @ 11:00:00 | NDC 00904695661

## 2021-07-31 MED ADMIN — SODIUM CHLORIDE 0.9 % FLUSH [210767]: 10 mL | @ 15:00:00 | NDC 08290306546

## 2021-07-31 MED ADMIN — ACETAMINOPHEN 500 MG PO TAB [102]: 1000 mg | ORAL | @ 17:00:00 | NDC 00904672080

## 2021-07-31 MED ADMIN — GABAPENTIN 300 MG PO CAP [18308]: 600 mg | ORAL | @ 14:00:00 | Stop: 2021-07-31 | NDC 67877022305

## 2021-07-31 MED ADMIN — MECLIZINE 25 MG PO TAB [12025]: 25 mg | ORAL | @ 17:00:00 | NDC 50268052311

## 2021-08-01 MED ADMIN — HEPARIN, PORCINE (PF) 5,000 UNIT/0.5 ML IJ SYRG [95535]: 5000 [IU] | SUBCUTANEOUS | @ 03:00:00 | NDC 00409131611

## 2021-08-01 MED ADMIN — LEVOTHYROXINE 150 MCG PO TAB [4425]: 325 ug | ORAL | @ 11:00:00 | Stop: 2021-08-02 | NDC 00904695661

## 2021-08-01 MED ADMIN — ACETAMINOPHEN 500 MG PO TAB [102]: 1000 mg | ORAL | @ 11:00:00 | Stop: 2021-08-02 | NDC 00904673061

## 2021-08-01 MED ADMIN — HEPARIN, PORCINE (PF) 5,000 UNIT/0.5 ML IJ SYRG [95535]: 5000 [IU] | SUBCUTANEOUS | @ 11:00:00 | Stop: 2021-08-02 | NDC 00409131611

## 2021-08-01 MED ADMIN — ONDANSETRON HCL (PF) 4 MG/2 ML IJ SOLN [136012]: 8 mg | INTRAVENOUS | @ 20:00:00 | Stop: 2021-08-02 | NDC 72266012301

## 2021-08-01 MED ADMIN — MEROPENEM 1 GRAM IV SOLR [80713]: 2 g | INTRAVENOUS | @ 05:00:00 | Stop: 2021-08-02 | NDC 55150020830

## 2021-08-01 MED ADMIN — LEVOTHYROXINE 25 MCG PO TAB [4420]: 325 ug | ORAL | @ 11:00:00 | Stop: 2021-08-02 | NDC 00904694961

## 2021-08-01 MED ADMIN — LIDOCAINE 5 % TP PTMD [80759]: 1 | TOPICAL | @ 13:00:00 | Stop: 2021-08-02 | NDC 00591352511

## 2021-08-01 MED ADMIN — SODIUM CHLORIDE 0.9 % FLUSH [210767]: 10 mL | @ 14:00:00 | Stop: 2021-08-02 | NDC 08290306546

## 2021-08-01 MED ADMIN — SODIUM CHLORIDE 0.9 % FLUSH [210767]: 10 mL | @ 02:00:00 | NDC 08290306546

## 2021-08-01 MED ADMIN — SERTRALINE 50 MG PO TAB [82509]: 100 mg | ORAL | @ 13:00:00 | Stop: 2021-08-02 | NDC 00904692561

## 2021-08-01 MED ADMIN — OXYCODONE 5 MG PO TAB [10814]: 10 mg | ORAL | @ 03:00:00 | NDC 68084035411

## 2021-08-01 MED ADMIN — GABAPENTIN 300 MG PO CAP [18308]: 900 mg | ORAL | @ 13:00:00 | Stop: 2021-08-01 | NDC 67877022305

## 2021-08-01 MED ADMIN — OXYCODONE 5 MG PO TAB [10814]: 10 mg | ORAL | @ 20:00:00 | Stop: 2021-08-02 | NDC 68084035411

## 2021-08-01 MED ADMIN — HEPARIN, PORCINE (PF) 5,000 UNIT/0.5 ML IJ SYRG [95535]: 5000 [IU] | SUBCUTANEOUS | @ 20:00:00 | Stop: 2021-08-02 | NDC 00409131611

## 2021-08-01 MED ADMIN — ACETAMINOPHEN 500 MG PO TAB [102]: 1000 mg | ORAL | @ 05:00:00 | Stop: 2021-08-02 | NDC 00904673061

## 2021-08-01 MED ADMIN — GABAPENTIN 300 MG PO CAP [18308]: 900 mg | ORAL | @ 02:00:00 | NDC 67877022305

## 2021-08-01 MED ADMIN — BUPROPION XL 300 MG PO TB24 [88618]: 300 mg | ORAL | @ 13:00:00 | Stop: 2021-08-02 | NDC 00904657304

## 2021-08-01 MED ADMIN — SODIUM CHLORIDE 0.9 % IV SOLP [27838]: 2 g | INTRAVENOUS | @ 05:00:00 | Stop: 2021-08-02 | NDC 00338004938

## 2021-08-01 MED ADMIN — OXYCODONE 5 MG PO TAB [10814]: 10 mg | ORAL | @ 13:00:00 | Stop: 2021-08-02 | NDC 68084035411

## 2021-08-01 MED ADMIN — SODIUM CHLORIDE 0.9 % IV SOLP [27838]: 2 g | INTRAVENOUS | @ 13:00:00 | Stop: 2021-08-02 | NDC 00338004938

## 2021-08-01 MED ADMIN — MEROPENEM 1 GRAM IV SOLR [80713]: 2 g | INTRAVENOUS | @ 13:00:00 | Stop: 2021-08-02 | NDC 55150020830

## 2021-08-01 MED ADMIN — LOSARTAN 25 MG PO TAB [80886]: 25 mg | ORAL | @ 13:00:00 | Stop: 2021-08-02 | NDC 00904704761

## 2021-08-02 ENCOUNTER — Encounter: Admit: 2021-08-02 | Discharge: 2021-08-02 | Payer: MEDICARE

## 2021-08-09 ENCOUNTER — Encounter: Admit: 2021-08-09 | Discharge: 2021-08-09 | Payer: MEDICARE

## 2021-08-09 DIAGNOSIS — D093 Carcinoma in situ of thyroid and other endocrine glands: Secondary | ICD-10-CM

## 2021-08-11 ENCOUNTER — Encounter: Admit: 2021-08-11 | Discharge: 2021-08-11 | Payer: MEDICARE

## 2021-08-11 ENCOUNTER — Ambulatory Visit: Admit: 2021-08-11 | Discharge: 2021-08-11 | Payer: MEDICARE

## 2021-08-11 DIAGNOSIS — E039 Hypothyroidism, unspecified: Secondary | ICD-10-CM

## 2021-08-11 DIAGNOSIS — M545 Chronic lower back pain: Secondary | ICD-10-CM

## 2021-08-11 DIAGNOSIS — E785 Hyperlipidemia, unspecified: Secondary | ICD-10-CM

## 2021-08-11 DIAGNOSIS — K2270 Barrett's esophagus without dysplasia: Secondary | ICD-10-CM

## 2021-08-11 DIAGNOSIS — R519 Generalized headaches: Secondary | ICD-10-CM

## 2021-08-11 DIAGNOSIS — Z9889 Other specified postprocedural states: Secondary | ICD-10-CM

## 2021-08-11 DIAGNOSIS — IMO0002 Squamous cell carcinoma: Secondary | ICD-10-CM

## 2021-08-11 DIAGNOSIS — E041 Nontoxic single thyroid nodule: Secondary | ICD-10-CM

## 2021-08-11 DIAGNOSIS — N189 Chronic kidney disease, unspecified: Secondary | ICD-10-CM

## 2021-08-11 DIAGNOSIS — M1990 Unspecified osteoarthritis, unspecified site: Secondary | ICD-10-CM

## 2021-08-11 DIAGNOSIS — D539 Nutritional anemia, unspecified: Secondary | ICD-10-CM

## 2021-08-11 DIAGNOSIS — G629 Polyneuropathy, unspecified: Secondary | ICD-10-CM

## 2021-08-11 DIAGNOSIS — E3120 Multiple endocrine neoplasia [MEN] syndrome, unspecified: Secondary | ICD-10-CM

## 2021-08-11 DIAGNOSIS — N62 Hypertrophy of breast: Secondary | ICD-10-CM

## 2021-08-11 DIAGNOSIS — F419 Anxiety disorder, unspecified: Secondary | ICD-10-CM

## 2021-08-11 DIAGNOSIS — C4490 Unspecified malignant neoplasm of skin, unspecified: Secondary | ICD-10-CM

## 2021-08-11 DIAGNOSIS — K3184 Gastroparesis: Secondary | ICD-10-CM

## 2021-08-11 DIAGNOSIS — K319 Disease of stomach and duodenum, unspecified: Secondary | ICD-10-CM

## 2021-08-11 DIAGNOSIS — Z8719 Personal history of other diseases of the digestive system: Secondary | ICD-10-CM

## 2021-08-11 DIAGNOSIS — R2689 Other abnormalities of gait and mobility: Secondary | ICD-10-CM

## 2021-08-11 DIAGNOSIS — I10 Essential (primary) hypertension: Secondary | ICD-10-CM

## 2021-08-11 DIAGNOSIS — A498 Other bacterial infections of unspecified site: Secondary | ICD-10-CM

## 2021-08-11 DIAGNOSIS — K5732 Diverticulitis of large intestine without perforation or abscess without bleeding: Secondary | ICD-10-CM

## 2021-08-11 DIAGNOSIS — M549 Dorsalgia, unspecified: Secondary | ICD-10-CM

## 2021-08-11 DIAGNOSIS — C73 Malignant neoplasm of thyroid gland: Secondary | ICD-10-CM

## 2021-08-11 DIAGNOSIS — W19XXXA Unspecified fall, initial encounter: Secondary | ICD-10-CM

## 2021-08-11 DIAGNOSIS — N200 Calculus of kidney: Secondary | ICD-10-CM

## 2021-08-11 DIAGNOSIS — E119 Type 2 diabetes mellitus without complications: Secondary | ICD-10-CM

## 2021-08-11 DIAGNOSIS — N529 Male erectile dysfunction, unspecified: Secondary | ICD-10-CM

## 2021-08-11 DIAGNOSIS — N390 Urinary tract infection, site not specified: Secondary | ICD-10-CM

## 2021-08-11 DIAGNOSIS — B999 Unspecified infectious disease: Secondary | ICD-10-CM

## 2021-08-11 DIAGNOSIS — K219 Gastro-esophageal reflux disease without esophagitis: Secondary | ICD-10-CM

## 2021-08-11 DIAGNOSIS — N400 Enlarged prostate without lower urinary tract symptoms: Secondary | ICD-10-CM

## 2021-08-12 ENCOUNTER — Encounter: Admit: 2021-08-12 | Discharge: 2021-08-12 | Payer: MEDICARE

## 2021-08-12 ENCOUNTER — Emergency Department: Admit: 2021-08-12 | Discharge: 2021-08-12 | Payer: MEDICARE

## 2021-08-12 ENCOUNTER — Inpatient Hospital Stay: Admit: 2021-08-12 | Payer: MEDICARE

## 2021-08-12 DIAGNOSIS — S72401A Unspecified fracture of lower end of right femur, initial encounter for closed fracture: Secondary | ICD-10-CM

## 2021-08-12 DIAGNOSIS — S728X1A Other fracture of right femur, initial encounter for closed fracture: Secondary | ICD-10-CM

## 2021-08-12 DIAGNOSIS — T1490XA Injury, unspecified, initial encounter: Secondary | ICD-10-CM

## 2021-08-12 DIAGNOSIS — M978XXA Periprosthetic fracture around other internal prosthetic joint, initial encounter: Secondary | ICD-10-CM

## 2021-08-12 LAB — URINALYSIS MICROSCOPIC REFLEX TO CULTURE

## 2021-08-12 LAB — BASIC METABOLIC PANEL
ANION GAP: 10 % — ABNORMAL HIGH (ref 3–12)
CHLORIDE: 104 MMOL/L (ref 98–110)
CO2: 25 MMOL/L (ref 21–30)
GLUCOSE,PANEL: 100 mg/dL (ref 70–100)
POTASSIUM: 3.9 MMOL/L (ref 3.5–5.1)
SODIUM: 139 MMOL/L — ABNORMAL LOW (ref 137–147)

## 2021-08-12 LAB — COVID-19 (SARS-COV-2) PCR

## 2021-08-12 LAB — ALCOHOL LEVEL: ALCOHOL: <10 mg/dL — ABNORMAL LOW (ref >60)

## 2021-08-12 LAB — URINALYSIS DIPSTICK REFLEX TO CULTURE
NITRITE: NEGATIVE
URINE ASCORBIC ACID, UA: POSITIVE — AB
URINE BILE: NEGATIVE

## 2021-08-12 LAB — CBC: WBC COUNT: 9.1 K/UL (ref 4.5–11.0)

## 2021-08-12 LAB — PROTIME INR (PT): PROTIME: 11 s — ABNORMAL LOW (ref 9.5–14.2)

## 2021-08-12 LAB — TSH WITH FREE T4 REFLEX: TSH: 0 uU/mL — ABNORMAL LOW (ref 0.35–5.00)

## 2021-08-12 LAB — POC GLUCOSE
POC GLUCOSE: 134 mg/dL — ABNORMAL HIGH (ref 70–100)
POC GLUCOSE: 155 mg/dL — ABNORMAL HIGH (ref 70–100)
POC GLUCOSE: 118 mg/dL — ABNORMAL HIGH (ref 70–100)
POC GLUCOSE: 149 mg/dL — ABNORMAL HIGH (ref 70–100)

## 2021-08-12 LAB — PTT (APTT): PTT: 31 s — ABNORMAL LOW (ref 24.0–36.5)

## 2021-08-12 LAB — IONIZED CALCIUM: IONIZED CALCIUM: 1.2 MMOL/L (ref 1.0–1.3)

## 2021-08-12 LAB — FREE T4-FREE THYROXINE: FREE T4: 1.6 ng/dL (ref 0.6–1.6)

## 2021-08-12 LAB — LACTIC ACID (BG - RAPID LACTATE): LACTIC ACID(SYRINGE): 1.5 MMOL/L — ABNORMAL HIGH (ref 0.5–2.0)

## 2021-08-12 LAB — CREATINE KINASE-CPK: CK TOTAL: 54 U/L (ref 35–232)

## 2021-08-12 MED ORDER — FENTANYL CITRATE (PF) 50 MCG/ML IJ SOLN
50 ug | Freq: Once | INTRAVENOUS | 0 refills | Status: CP
Start: 2021-08-12 — End: ?
  Administered 2021-08-12: 11:00:00 50 ug via INTRAVENOUS

## 2021-08-12 MED ORDER — ATORVASTATIN 20 MG PO TAB
20 mg | Freq: Every day | ORAL | 0 refills | Status: AC
Start: 2021-08-12 — End: ?
  Administered 2021-08-12 – 2021-08-22 (×9): 20 mg via ORAL

## 2021-08-12 MED ORDER — INSULIN ASPART 100 UNIT/ML SC FLEXPEN
0-12 [IU] | Freq: Three times a day (TID) | SUBCUTANEOUS | 0 refills | Status: AC
Start: 2021-08-12 — End: ?
  Administered 2021-08-14: 18:00:00 1 [IU] via SUBCUTANEOUS

## 2021-08-12 MED ORDER — FENTANYL CITRATE (PF) 50 MCG/ML IJ SOLN
12.5-25 ug | INTRAVENOUS | 0 refills | Status: AC | PRN
Start: 2021-08-12 — End: ?
  Administered 2021-08-12 – 2021-08-14 (×5): 25 ug via INTRAVENOUS

## 2021-08-12 MED ORDER — TIZANIDINE 4 MG PO TAB
2 mg | ORAL | 0 refills | Status: AC | PRN
Start: 2021-08-12 — End: ?
  Administered 2021-08-12 – 2021-08-14 (×5): 2 mg via ORAL

## 2021-08-12 MED ORDER — IOHEXOL 350 MG IODINE/ML IV SOLN
100 mL | Freq: Once | INTRAVENOUS | 0 refills | Status: CP
Start: 2021-08-12 — End: ?
  Administered 2021-08-12: 06:00:00 100 mL via INTRAVENOUS

## 2021-08-12 MED ORDER — LACTATED RINGERS IV SOLP
INTRAVENOUS | 0 refills | Status: DC
Start: 2021-08-12 — End: 2021-08-12

## 2021-08-12 MED ORDER — LOSARTAN 25 MG PO TAB
25 mg | Freq: Every day | ORAL | 0 refills | Status: AC
Start: 2021-08-12 — End: ?
  Administered 2021-08-12 – 2021-08-14 (×2): 25 mg via ORAL

## 2021-08-12 MED ORDER — OXYCODONE 5 MG PO TAB
5-10 mg | ORAL | 0 refills | Status: AC | PRN
Start: 2021-08-12 — End: ?
  Administered 2021-08-12: 18:00:00 5 mg via ORAL
  Administered 2021-08-12: 22:00:00 10 mg via ORAL
  Administered 2021-08-12: 16:00:00 5 mg via ORAL
  Administered 2021-08-13 – 2021-08-17 (×9): 10 mg via ORAL

## 2021-08-12 MED ORDER — ENOXAPARIN 30 MG/0.3 ML SC SYRG
30 mg | Freq: Two times a day (BID) | SUBCUTANEOUS | 0 refills | Status: DC
Start: 2021-08-12 — End: 2021-08-12

## 2021-08-12 MED ORDER — METHOCARBAMOL 750 MG PO TAB
750 mg | Freq: Two times a day (BID) | ORAL | 0 refills | Status: DC
Start: 2021-08-12 — End: 2021-08-12
  Administered 2021-08-12: 10:00:00 750 mg via ORAL

## 2021-08-12 MED ORDER — ACETAMINOPHEN 325 MG PO TAB
650 mg | ORAL | 0 refills | Status: DC | PRN
Start: 2021-08-12 — End: 2021-08-12
  Administered 2021-08-12: 09:00:00 650 mg via ORAL

## 2021-08-12 MED ORDER — METOPROLOL TARTRATE 25 MG PO TAB
50 mg | Freq: Two times a day (BID) | ORAL | 0 refills | Status: AC
Start: 2021-08-12 — End: ?
  Administered 2021-08-12 – 2021-08-18 (×12): 50 mg via ORAL

## 2021-08-12 MED ORDER — SODIUM CHLORIDE 0.9 % IJ SOLN
50 mL | Freq: Once | INTRAVENOUS | 0 refills | Status: CP
Start: 2021-08-12 — End: ?
  Administered 2021-08-12: 06:00:00 50 mL via INTRAVENOUS

## 2021-08-12 MED ORDER — TRAMADOL 50 MG PO TAB
50 mg | ORAL | 0 refills | Status: DC | PRN
Start: 2021-08-12 — End: 2021-08-12
  Administered 2021-08-12: 09:00:00 50 mg via ORAL

## 2021-08-12 MED ORDER — TAMSULOSIN 0.4 MG PO CAP
.4 mg | Freq: Every day | ORAL | 0 refills | Status: AC
Start: 2021-08-12 — End: ?
  Administered 2021-08-12 – 2021-08-19 (×6): 0.4 mg via ORAL

## 2021-08-12 MED ORDER — HEPARIN, PORCINE (PF) 5,000 UNIT/0.5 ML IJ SYRG
5000 [IU] | SUBCUTANEOUS | 0 refills | Status: AC
Start: 2021-08-12 — End: ?
  Administered 2021-08-12 – 2021-08-22 (×27): 5000 [IU] via SUBCUTANEOUS

## 2021-08-12 MED ORDER — DEXTROSE 50 % IN WATER (D50W) IV SYRG
12.5-25 g | INTRAVENOUS | 0 refills | Status: AC | PRN
Start: 2021-08-12 — End: ?

## 2021-08-12 MED ORDER — CYCLOBENZAPRINE 10 MG PO TAB
10 mg | Freq: Three times a day (TID) | ORAL | 0 refills | Status: DC
Start: 2021-08-12 — End: 2021-08-12

## 2021-08-12 MED ORDER — ACETAMINOPHEN 500 MG PO TAB
1000 mg | ORAL | 0 refills | Status: AC
Start: 2021-08-12 — End: ?
  Administered 2021-08-12 – 2021-08-21 (×25): 1000 mg via ORAL

## 2021-08-12 MED ORDER — POLYETHYLENE GLYCOL 3350 17 GRAM PO PWPK
17 g | Freq: Every day | ORAL | 0 refills | Status: AC
Start: 2021-08-12 — End: ?
  Administered 2021-08-14 – 2021-08-22 (×4): 17 g via ORAL

## 2021-08-12 MED ORDER — GABAPENTIN 100 MG PO CAP
200 mg | ORAL | 0 refills | Status: AC
Start: 2021-08-12 — End: ?
  Administered 2021-08-12 – 2021-08-15 (×9): 200 mg via ORAL

## 2021-08-12 MED ORDER — ONDANSETRON HCL (PF) 4 MG/2 ML IJ SOLN
4 mg | INTRAVENOUS | 0 refills | Status: AC | PRN
Start: 2021-08-12 — End: ?

## 2021-08-12 MED ORDER — SENNOSIDES 8.6 MG PO TAB
1 | Freq: Two times a day (BID) | ORAL | 0 refills | Status: AC
Start: 2021-08-12 — End: ?
  Administered 2021-08-12 – 2021-08-22 (×15): 1 via ORAL

## 2021-08-12 MED ORDER — DUTASTERIDE 0.5 MG PO CAP
.5 mg | Freq: Every day | ORAL | 0 refills | Status: AC
Start: 2021-08-12 — End: ?
  Administered 2021-08-12 – 2021-08-22 (×9): 0.5 mg via ORAL

## 2021-08-12 MED ORDER — INSULIN GLARGINE 100 UNIT/ML (3 ML) SC INJ PEN
15 [IU] | Freq: Every evening | SUBCUTANEOUS | 0 refills | Status: AC
Start: 2021-08-12 — End: ?
  Administered 2021-08-13: 03:00:00 15 [IU] via SUBCUTANEOUS

## 2021-08-12 NOTE — Consults
[facility] Consult Note      Admission Date: 08/12/2021                                                  Chief Complaint/Reason for Consult:  R periprosthetic femur fracture     Assessment/Plan     Jimmy Waters is a 66 y.o. male with a PMH significant for prior R THA, TKA, nephrostomy tube placement for kidney infection who presents as a trauma after a ground level fall in which he sustained a right periprosthetic femur fracture     -Surgical intervention: No acute surgical intervention at this time  - Plan for OR Sunday for surgical stabilization of R periprosthetic femur fracture   -WB Status - NWB RLE, knee immobilizer   -Please keep NPO  -Pain control/medical management per primary  -Diet: NPO pending further discussion with staff  -Radiographs: please obtain R femur XR  -Abx/tetanus: none indicated      Patient discussed with staff surgeon Dr. Wilkie Aye who directed plan of care.    During normal business hours, please contact Ricky Mcentee. At all other times, contact the orthopedic surgery resident on call for any questions or concerns.         Skip Estimable, MD  [phone]  ____________________________________________________________________________________________________________________________________________      History of Present Illness: Jimmy Waters is a 66 y.o. male with a PMH significant for prior R THA, TKA, nephrostomy tube placement for kidney infection who presents as a trauma after a ground level fall in which he sustained a right periprosthetic femur fracture. Patient states that he was walking to his refrigerator when he feels like his legs were weak and gave out on him and he fell on his right leg. He states that he had immediate pain to his right leg after the fall and was unable to ambulate. He states that he mainly walks with a walker at baseline. Denies hitting his head or LOC. Denies any numbness or tingling in his right leg.     No past medical history on file.  No past surgical history on file.         Family Hx: Reviewed and non-contributory  Allergies:  Patient has no allergy information on record.    No current outpatient medications on file as of 08/12/2021.         Review of Systems:    Positive: right thigh pain  Negative: SOB    Otherwise, 10 point ROS was negative except the pertinent information included in the HPI    Vital Signs:  Last Filed in 24 hours   BP: 170/81 (05/20 0100)  Pulse: 88 (05/20 0100)  Respirations: 14 PER MINUTE (05/20 0100)  SpO2: 99 % (05/20 0100)  O2 Device: Nasal cannula (05/20 0029)  O2 Liter Flow: 4 Lpm (05/20 0029)  SpO2 Pulse: 80 (05/20 0100)     Physical Exam:    Constitutional: A&O, NAD  HEENT: EOMI, normocephalic  Neck: Supple, symmetric   Respiratory:  Unlabored respirations  Cardiovascular: Regular rate  Gastrointestinal: Soft, non-distended  Skin: No open fractures, rashes, abrasions, lacerations  Musculoskeletal: RLE: compartments compressible although swelling noted to right distal thigh, no pain with passive stretch, TA/EHL/FHL/PT function intact, SILT, distal cap refill < 2 sec, palpable PT and DP pulses    Lab/Radiology/Other Diagnostic Tests:    Radiology: Reviewed  CBC w/Diff   Lab Results   Component Value Date/Time    WBC 9.1 08/12/2021 12:20 AM    HGB 8.8 (L) 08/12/2021 12:20 AM    HCT 27.3 (L) 08/12/2021 12:20 AM    PLTCT 263 08/12/2021 12:20 AM           Basic Metabolic Profile   Lab Results   Component Value Date/Time    NA 139 08/12/2021 12:20 AM    K 3.9 08/12/2021 12:20 AM    CL 104 08/12/2021 12:20 AM    CO2 25 08/12/2021 12:20 AM    GAP 10 08/12/2021 12:20 AM    BUN 22 08/12/2021 12:20 AM    CR 1.30 (H) 08/12/2021 12:20 AM    GLU 100 08/12/2021 12:20 AM        Coagulation Studies   Lab Results   Component Value Date/Time    PT 11.0 08/12/2021 12:20 AM    PTT 31.1 08/12/2021 12:20 AM    INR 1.0 08/12/2021 12:20 AM            Skip Estimable, MD  [phone]

## 2021-08-12 NOTE — Progress Notes
OCCUPATIONAL THERAPY  NOTE   Name: Jimmy Waters   MRN: [id number]     DOB: 03/26/1900      Age: 66 y.o.  Admission Date: 08/12/2021     LOS: 0 days     Date of Service: 08/12/2021      Patient declining all mobility until after procedure, requesting therapy services follow up afterwards. Patient educated on RLE NWB precautions and benefit of working with OT/PT following surgery. Occupational Therapy will continue to follow and provide intervention as appropriate.     Therapist: Otho Najjar, OTR/L [phone]  Date: 08/12/2021

## 2021-08-12 NOTE — Progress Notes
Cervical Spine Clearance    Radiology reviewed personally    Patient alert, oriented, and cooperative    No gross motor or sensory deficits in bilateral upper and lower extremities    No cervical spine tenderness to palpation    Collar removed, and there was no cervical spine pain with active range of motion    C-spine cleared clinically    Jimmy Cotta, MD

## 2021-08-12 NOTE — Progress Notes
RT Adult Assessment Note    NAME:Jimmy Waters             MRN: [id number]             DOB:03/26/1900          AGE: 66 y.o.  ADMISSION DATE: 08/12/2021             DAYS ADMITTED: LOS: 0 days    RT Treatment Plan:       Protocol Plan: Procedures  PEP Therapy: Place a nursing order for "IS Q1h While Awake" for any of Lung Expansion indicators    Additional Comments:  Impressions of the patient: Patient resting in bed. No visible signs of respiratory distress noted at this time.   Intervention(s)/outcome(s): RT Eval  Patient education that was completed: N/A  Recommendations to the care team: None at this time.     Vital Signs:  Pulse: 77  RR: 16 PER MINUTE  SpO2: 95 %  O2 Device: None (Room air)

## 2021-08-12 NOTE — Care Plan
Problem: Infection, Risk of  Goal: Absence of infection  Outcome: Goal Ongoing       Problem: Skin Integrity  Goal: Skin integrity intact  Goal: Healing of skin (Wound & Incision  Outcome: Goal Ongoing  Goal: Healing of skin (Pressure Injury)  08/12/2021 1754 by Leighton Roach, RN  Outcome: Goal Ongoing     Problem: High Fall Risk  Goal: High Fall Risk  08/12/2021 1754 by Leighton Roach, RN  Outcome: Goal Ongoing    Problem: Pain  Goal: Management of pain  08/12/2021 1754 by Leighton Roach, RN  Outcome: Goal Ongoing

## 2021-08-12 NOTE — Progress Notes
RT Adult Assessment Note    NAME:Jimmy Waters             MRN: [id number]             DOB:03/26/1900          AGE: 66 y.o.  ADMISSION DATE: 08/12/2021             DAYS ADMITTED: LOS: 0 days    RT Treatment Plan:       Protocol Plan: Procedures  PEP Therapy: Place a nursing order for "IS Q1h While Awake" for any of Lung Expansion indicators  Oxygen/Humidity: O2 to keep SpO2 > 92%, if on room air for > 24 hours and no other RT modalities are required, then D/C protocol  SpO2: BID & PRN    Additional Comments:  Impressions of the patient: stable, sat 97 on RA. Pt reported that he just had a surgery earlier this week, but denied any effection on abilities of coughing and normal breathing. Does not wear any oxygen at home, not in distress at this time.  Intervention(s)/outcome(s): as above  Patient education that was completed: none  Recommendations to the care team: none    Vital Signs:  Pulse: 81  RR: 16 PER MINUTE  SpO2: 97 %  O2 Device: None (Room air)  Liter Flow:    O2%:  21  Breath Sounds: Clear (Implies normal);Decreased  Respiratory Effort:  WDL

## 2021-08-12 NOTE — Progress Notes
ORTHOTICS/PROSTHETICS  Follow-up Note:           NAME: Jimmy Waters  ROOM: [id number]  DIAGNOSIS: Peri prosthetic fracture femoral shaft    DATE OF INITIAL CONSULT: 08/12/21    Spoke with nursing unit to advise that knee immobilizer comes from [employer]. Nurse stated that patient has one already. No service provided.         [facility]

## 2021-08-12 NOTE — ED Notes
Pt arrives via [place] with cc of fall. Pt activated as a Type 1 trauma and presents in trauma bay.

## 2021-08-12 NOTE — Progress Notes
Brief ortho update:    Discussed with staff: plan for OR tomorrow for surgical stabilization of R femur fracture  OK for diet today, NPO midnight   Attempted to place patient in knee immobilizer, however patient refused     Jimmy Waters    During normal business hours, please contactRicky Mcentee. At all other times, contact the orthopedic surgery resident on call for any questions or concerns.

## 2021-08-12 NOTE — Progress Notes
Trauma Type: Fall    Type 2 stable    Jimmy Waters, [birth year]  Pt.was alert, stable and cooperative     Met with the wife Jimmy Waters) in waiting room. She seemed in good spirits. Offered support but wasn't needed. Told her I was available if she or her husband needed anything. I was thanked but told she was fine.    The spiritual care team is available as needed, 24/7, through the campus switchboard [phone]). For a response within 24 hours, please submit an order in O2 for a chaplain consult.

## 2021-08-13 ENCOUNTER — Inpatient Hospital Stay: Admit: 2021-08-13 | Discharge: 2021-08-13 | Payer: MEDICARE

## 2021-08-13 ENCOUNTER — Encounter: Admit: 2021-08-13 | Discharge: 2021-08-13 | Payer: MEDICARE

## 2021-08-13 MED ORDER — ARTIFICIAL TEARS (PF) SINGLE DOSE DROPS GROUP
OPHTHALMIC | 0 refills | Status: DC
Start: 2021-08-13 — End: 2021-08-13
  Administered 2021-08-13: 13:00:00 2 [drp] via OPHTHALMIC

## 2021-08-13 MED ORDER — DEXAMETHASONE SODIUM PHOSPHATE 10 MG/ML IJ SOLN
0 refills | Status: DC
Start: 2021-08-13 — End: 2021-08-13
  Administered 2021-08-13: 19:00:00 4 mg

## 2021-08-13 MED ORDER — DEXAMETHASONE SODIUM PHOSPHATE 4 MG/ML IJ SOLN
INTRAVENOUS | 0 refills | Status: DC
Start: 2021-08-13 — End: 2021-08-13
  Administered 2021-08-13: 13:00:00 4 mg via INTRAVENOUS

## 2021-08-13 MED ORDER — HYDROMORPHONE (PF) 2 MG/ML IJ SYRG
INTRAVENOUS | 0 refills | Status: DC
Start: 2021-08-13 — End: 2021-08-13
  Administered 2021-08-13 (×2): .2 mg via INTRAVENOUS
  Administered 2021-08-13: 15:00:00 .4 mg via INTRAVENOUS
  Administered 2021-08-13 (×2): .2 mg via INTRAVENOUS

## 2021-08-13 MED ORDER — ROCURONIUM 10 MG/ML IV SOLN
INTRAVENOUS | 0 refills | Status: DC
Start: 2021-08-13 — End: 2021-08-13
  Administered 2021-08-13: 13:00:00 40 mg via INTRAVENOUS
  Administered 2021-08-13: 14:00:00 10 mg via INTRAVENOUS
  Administered 2021-08-13: 14:00:00 40 mg via INTRAVENOUS
  Administered 2021-08-13 (×2): 10 mg via INTRAVENOUS
  Administered 2021-08-13 (×2): 20 mg via INTRAVENOUS

## 2021-08-13 MED ORDER — PHENYLEPHRINE 40 MCG/ML IN NS IV DRIP (STD CONC)
INTRAVENOUS | 0 refills | Status: DC
Start: 2021-08-13 — End: 2021-08-13
  Administered 2021-08-13 (×2): .3 ug/kg/min via INTRAVENOUS

## 2021-08-13 MED ORDER — ONDANSETRON HCL (PF) 4 MG/2 ML IJ SOLN
INTRAVENOUS | 0 refills | Status: DC
Start: 2021-08-13 — End: 2021-08-13
  Administered 2021-08-13: 16:00:00 4 mg via INTRAVENOUS

## 2021-08-13 MED ORDER — TRANEXAMIC ACID IN NACL,ISO-OS 1,000 MG/100 ML (10 MG/ML) IV PGBK
INTRAVENOUS | 0 refills | Status: DC
Start: 2021-08-13 — End: 2021-08-13
  Administered 2021-08-13 (×2): 1 g via INTRAVENOUS

## 2021-08-13 MED ORDER — LIDOCAINE (PF) 10 MG/ML (1 %) IJ SOLN
SUBCUTANEOUS | 0 refills | Status: DC
Start: 2021-08-13 — End: 2021-08-13
  Administered 2021-08-13: 19:00:00 2 mL via SUBCUTANEOUS

## 2021-08-13 MED ORDER — PHENYLEPHRINE HCL IN 0.9% NACL 1 MG/10 ML (100 MCG/ML) IV SYRG
INTRAVENOUS | 0 refills | Status: DC
Start: 2021-08-13 — End: 2021-08-13
  Administered 2021-08-13 (×4): 100 ug via INTRAVENOUS

## 2021-08-13 MED ORDER — PROPOFOL INJ 10 MG/ML IV VIAL
INTRAVENOUS | 0 refills | Status: DC
Start: 2021-08-13 — End: 2021-08-13
  Administered 2021-08-13: 16:00:00 30 mg via INTRAVENOUS
  Administered 2021-08-13: 13:00:00 150 mg via INTRAVENOUS
  Administered 2021-08-13: 14:00:00 50 mg via INTRAVENOUS

## 2021-08-13 MED ORDER — FENTANYL CITRATE (PF) 50 MCG/ML IJ SOLN
INTRAVENOUS | 0 refills | Status: DC
Start: 2021-08-13 — End: 2021-08-13
  Administered 2021-08-13: 13:00:00 100 ug via INTRAVENOUS

## 2021-08-13 MED ORDER — BUPIVACAINE HCL 0.25 % (2.5 MG/ML) IJ SOLN
0 refills | Status: DC
Start: 2021-08-13 — End: 2021-08-13
  Administered 2021-08-13: 19:00:00 30 mL

## 2021-08-13 MED ORDER — SUGAMMADEX 100 MG/ML IV SOLN
INTRAVENOUS | 0 refills | Status: DC
Start: 2021-08-13 — End: 2021-08-13
  Administered 2021-08-13: 16:00:00 222 mg via INTRAVENOUS

## 2021-08-13 MED ORDER — LIDOCAINE (PF) 200 MG/10 ML (2 %) IJ SYRG
INTRAVENOUS | 0 refills | Status: DC
Start: 2021-08-13 — End: 2021-08-13
  Administered 2021-08-13: 13:00:00 100 mg via INTRAVENOUS

## 2021-08-13 MED ORDER — CEFAZOLIN 1 GRAM IJ SOLR
INTRAVENOUS | 0 refills | Status: DC
Start: 2021-08-13 — End: 2021-08-13
  Administered 2021-08-13: 13:00:00 2 g via INTRAVENOUS

## 2021-08-13 MED ORDER — PROPOFOL 10 MG/ML IV EMUL 100 ML (INFUSION)(AM)(OR)
INTRAVENOUS | 0 refills | Status: DC
Start: 2021-08-13 — End: 2021-08-13
  Administered 2021-08-13: 13:00:00 100 ug/kg/min via INTRAVENOUS
  Administered 2021-08-13: 15:00:00 90 ug/kg/min via INTRAVENOUS

## 2021-08-13 MED ADMIN — METOCLOPRAMIDE HCL 5 MG/ML IJ SOLN [5002]: 10 mg | INTRAVENOUS | @ 17:00:00 | Stop: 2021-08-13 | NDC 00409341418

## 2021-08-13 MED ADMIN — FENTANYL CITRATE (PF) 50 MCG/ML IJ SOLN [3037]: 25 ug | INTRAVENOUS | @ 18:00:00 | Stop: 2021-08-13 | NDC 00409909412

## 2021-08-13 MED ADMIN — FENTANYL CITRATE (PF) 50 MCG/ML IJ SOLN [3037]: 25 ug | INTRAVENOUS | @ 17:00:00 | Stop: 2021-08-13 | NDC 00409909412

## 2021-08-13 MED ADMIN — LACTATED RINGERS IV SOLP [4318]: 1000.000 mL | INTRAVENOUS | @ 13:00:00 | Stop: 2021-08-13 | NDC 00338011704

## 2021-08-13 MED ADMIN — FENTANYL CITRATE (PF) 50 MCG/ML IJ SOLN [3037]: 25 ug | INTRAVENOUS | @ 19:00:00 | Stop: 2021-08-13 | NDC 00409909412

## 2021-08-13 MED ADMIN — SODIUM CHLORIDE 0.9 % IV SOLP [27838]: 1000 mL | INTRAVENOUS | @ 13:00:00 | Stop: 2021-08-13 | NDC 00338004904

## 2021-08-13 MED ADMIN — VANCOMYCIN 1,000 MG IV SOLR [8442]: 1 g | TOPICAL | @ 15:00:00 | Stop: 2021-08-13 | NDC 00409653311

## 2021-08-13 MED ADMIN — ACETAMINOPHEN 500 MG PO TAB [102]: 1000 mg | ORAL | @ 17:00:00 | Stop: 2021-08-13 | NDC 00904672080

## 2021-08-13 NOTE — Anesthesia Post-Procedure Evaluation
Post-Anesthesia Evaluation    Name: Jimmy Waters      MRN: [id number]     DOB: 10/25/1955     Age: 66 y.o.     Sex: male   __________________________________________________________________________     Procedure Information     Anesthesia Start Date/Time: 08/13/21 0811    Procedure: OPEN TREATMENT FEMORAL SHAFT FRACTURE WITH PLATE/ SCREWS WITH/ WITHOUT CERCLAGE (Right: Thigh)    Location: MAIN OR 36 / Main OR/Periop    Surgeons: Jarvis Newcomer, MD          Post-Anesthesia Vitals  BP: 151/80 (05/21 1400)  Pulse: 86 (05/21 1415)  Respirations: 15 PER MINUTE (05/21 1415)  SpO2: 98 % (05/21 1415)  SpO2 Pulse: 85 (05/21 1415)  O2 Device: Nasal cannula (05/21 1415)   Vitals Value Taken Time   BP 151/80 08/13/21 1400   Temp 36.6 C (97.9 F) 08/13/21 1139   Pulse 86 08/13/21 1415   Respirations 15 PER MINUTE 08/13/21 1415   SpO2 98 % 08/13/21 1415   O2 Device Nasal cannula 08/13/21 1415   ABP     ART BP           Post Anesthesia Evaluation Note    Evaluation location: Pre/Post  Patient participation: recovered; patient participated in evaluation  Level of consciousness: alert  Pain management: adequate    Hydration: normovolemia  Temperature: 36.0C - 38.4C  Airway patency: adequate    Regional/Neuraxial:         Single injection shot performed    Perioperative Events       Post-op nausea and vomiting: no PONV    Postoperative Status  Cardiovascular status: hemodynamically stable  Respiratory status: spontaneous ventilation and supplemental oxygen  Follow-up needed: none        Perioperative Events  There were no known notable events for this encounter.

## 2021-08-13 NOTE — Anesthesia Procedure Notes
Anesthesia Procedure: Peripheral Nerve Block    PERIPHERAL NERVE BLOCK    Date/Time: 08/13/2021 1:40 PM    Patient location: post-op  Reason for block: at surgeon's request and post-op pain management    Preprocedure checklist performed: 2 patient identifiers, risks & benefits discussed, patient evaluated, timeout performed, consent obtained, patient being monitored and sterile drape    Sterile technique:  - Proper hand washing  - Cap, mask  - Sterile gloves  - Skin prep for antisepsis        Peripheral Nerve Block Procedure   Patient position: supine  Prep: ChloraPrep    Monitoring: BP, EKG and continuous pulse ox  Block type: fascia iliaca  Laterality: right  Injection technique: single-shot  Procedures: ultrasound guided      Needle/cathether:      Needle type: Stimuplex      Needle gauge: 22 G; Needle length: 4 in     Needle location: anatomical landmarks and ultrasound guidance    Procedure Medications    Local Anesthesia: lidocaine PF 1% (10 mg/mL) injection - Subcutaneous   2 mL - 08/13/2021 1:40:00 PM  Bolus Dose: bupivacaine (MARCAINE) 0.25% injection - Block   30 mL - 08/13/2021 1:40:00 PM  Adjuvant Medications: dexAMETHasone (DECADRON) 10 mg/mL injection - SEE ADMIN INSTRUCTIONS   4 mg - 08/13/2021 1:40:00 PM    Procedure Outcome   Injection assessment: negative aspiration for heme, no paresthesia on injection, incremental injection and local visualized surrounding nerve on ultrasound  Observations: adequate block, patient sedated but conversant throughout block, patient tolerated the procedure well with no immediate complications and comfortable throughout block      Refer to nursing documentation for vitals and monitoring data during procedure.    Performed by: Monico Hoar, DO  Authorized by: Pleas Patricia, MD

## 2021-08-13 NOTE — ED Provider Notes
Jimmy Waters is a 66 y.o. male.    Chief Complaint:  Chief Complaint   Patient presents with   ? Fall       History of Present Illness:  66 year old male with unknown past medical history presenting to the emergency department for concern of right lower extremity pain and deformity, activated as a type II trauma.  History severely limited secondary to acuity of condition.          Review of Systems:  Review of Systems   Unable to perform ROS: Acuity of condition       Allergies:  Ambien [zolpidem]    Past Medical History:  No past medical history on file.    Past Surgical History:  No past surgical history on file.    Pertinent medical/surgical history reviewed    Social History:     Social History     Substance and Sexual Activity   Drug Use Not on file             Family History:  No family history on file.    Vitals:  ED Vitals    Date and Time T BP P RR SPO2P SPO2 User   08/12/21 0230 -- 157/80 87 20 PER MINUTE 85 98 % SW   08/12/21 0200 -- 168/92 90 16 PER MINUTE 90 98 % SW   08/12/21 0146 -- 169/89 91 15 PER MINUTE 92 100 % SW   08/12/21 0119 -- 176/97 90 15 PER MINUTE 90 98 % SW   08/12/21 0100 -- 170/81 88 14 PER MINUTE 80 99 % SW          Physical Exam:  Physical Exam  HENT:      Head: Normocephalic and atraumatic.      Right Ear: External ear normal.      Left Ear: External ear normal.      Nose: Nose normal.      Mouth/Throat:      Mouth: Mucous membranes are moist.   Cardiovascular:      Pulses: Normal pulses.   Pulmonary:      Effort: Pulmonary effort is normal.      Breath sounds: Normal breath sounds.   Musculoskeletal:         General: Swelling, tenderness and deformity present.      Comments: Obvious right femoral deformity, swelling   Neurological:      Mental Status: He is alert.         Laboratory Results:  Labs Reviewed   CBC - Abnormal       Result Value Ref Range Status    White Blood Cells 9.1  4.5 - 11.0 K/UL Final    RBC 3.17 (*) 4.4 - 5.5 M/UL Final    Hemoglobin 8.8 (*) 13.5 - 16.5 GM/DL Final    Hematocrit 95.6 (*) 40 - 50 % Final    MCV 86.2  80 - 100 FL Final    MCH 27.9  26 - 34 PG Final    MCHC 32.3  32.0 - 36.0 G/DL Final    RDW 21.3 (*) 11 - 15 % Final    Platelet Count 263  150 - 400 K/UL Final    MPV 8.1  7 - 11 FL Final   BASIC METABOLIC PANEL - Abnormal    Sodium 139  137 - 147 MMOL/L Final    Potassium 3.9  3.5 - 5.1 MMOL/L Final    Chloride 104  98 - 110  MMOL/L Final    CO2 25  21 - 30 MMOL/L Final    Anion Gap 10  3 - 12 Final    Glucose 100  70 - 100 MG/DL Final    Blood Urea Nitrogen 22  7 - 25 MG/DL Final    Creatinine 1.61 (*) 0.4 - 1.24 MG/DL Final    Calcium 9.1  8.5 - 10.6 MG/DL Final    eGFR 43 (*) >09 mL/min Final   RAPID TEG - Abnormal    ACT Rapid 160 (*) 91 - 143 s Final    Angle Rapid 75.2  67 - 82 DEG Final    MA Rapid 77.1  58 - 78 MM Final    LYSIS30 Rapid 0.0  0 - 4 % Final   PROTIME INR (PT)    Protime 11.0  9.5 - 14.2 SEC Final    INR 1.0  0.8 - 1.2 Final   PTT (APTT)    APTT 31.1  24.0 - 36.5 SEC Final   IONIZED CALCIUM    Ionized Calcium 1.21  1.0 - 1.3 MMOL/L Final   LACTIC ACID (BG - RAPID LACTATE)    Lactic Acid,BG 1.5  0.5 - 2.0 MMOL/L Final   CREATINE KINASE-CPK    Creatine Kinase 54  35 - 232 U/L Final   ALCOHOL LEVEL    Alcohol <10  MG/DL Final   POC GLUCOSE   POC GLUCOSE   POC GLUCOSE   BLOOD TYPE CONFIRMATION - ORDER ONLY IF REQUESTED BY LAB    ABO/RH(D) O POS   Final     POC Glucose (Download): (!) 149    Radiology Interpretation:    FEMUR 2 VIEWS RIGHT   Final Result         Acute, displaced spiral type fracture to the distal femoral diaphysis. Inferior extension of a nondisplaced fracture line towards the anterior portion of the femoral component of the right TKA.          Finalized by Maida Sale, MD on 08/12/2021 2:01 AM. Dictated by Maida Sale, MD on 08/12/2021 1:57 AM.         KNEE 1 OR 2 VIEWS RIGHT   Final Result         Acute, displaced spiral type fracture to the distal femoral diaphysis. Inferior extension of a nondisplaced fracture line towards the anterior portion of the femoral component of the right TKA.          Finalized by Maida Sale, MD on 08/12/2021 2:01 AM. Dictated by Maida Sale, MD on 08/12/2021 1:57 AM.         CT LOWER EXTREM WO CONT RIGHT   Final Result         1. Acute spiral type fracture to the distal right femoral diaphysis, with one shaft width of lateral displacement of the distal fracture fragment and mild varus angulation.   2. Trace right suprapatellar effusion with synovial thickening.          Finalized by Maida Sale, MD on 08/12/2021 1:43 AM. Dictated by Maida Sale, MD on 08/12/2021 1:36 AM.         CT CHEST W CONTRAST   Final Result         CHEST:      1.  No acute fracture or evidence of acute traumatic visceral injury within the thorax.   2.  Mild, upper lobe predominant groundglass opacities with mild interlobular septal thickening, suggestive of  mild CHF/volume overload.      ABDOMEN AND PELVIS:      1.  Subcentimeter subcapsular right perinephric hematoma without discrete laceration visualized. There is also subtle hypoenhancement of the inferior portion of the right kidney, which may reflect infarct or infection. Indwelling percutaneous nephroureteral stent is also present. Correlation with urinalysis and patient's medical history recommended.   2.  Irregular hyperdensity within the right renal collecting system, likely small nonobstructing stones.   3.  Otherwise, no evidence of acute traumatic visceral injury within the abdomen or pelvis. No acute fracture.   4.  Mild colonic diverticulosis.         By my electronic signature, I attest that I have personally reviewed the images for this examination and formulated the interpretations and opinions expressed in this report          Finalized by Maida Sale, MD on 08/12/2021 1:52 AM. Dictated by Meredith Mody, MD on 08/12/2021 1:16 AM.         CT ABD/PELV W CONTRAST   Final Result         CHEST:      1.  No acute fracture or evidence of acute traumatic visceral injury within the thorax.   2.  Mild, upper lobe predominant groundglass opacities with mild interlobular septal thickening, suggestive of mild CHF/volume overload.      ABDOMEN AND PELVIS:      1.  Subcentimeter subcapsular right perinephric hematoma without discrete laceration visualized. There is also subtle hypoenhancement of the inferior portion of the right kidney, which may reflect infarct or infection. Indwelling percutaneous nephroureteral stent is also present. Correlation with urinalysis and patient's medical history recommended.   2.  Irregular hyperdensity within the right renal collecting system, likely small nonobstructing stones.   3.  Otherwise, no evidence of acute traumatic visceral injury within the abdomen or pelvis. No acute fracture.   4.  Mild colonic diverticulosis.         By my electronic signature, I attest that I have personally reviewed the images for this examination and formulated the interpretations and opinions expressed in this report          Finalized by Maida Sale, MD on 08/12/2021 1:52 AM. Dictated by Meredith Mody, MD on 08/12/2021 1:16 AM.         CT HEAD WO CONTRAST   Final Result         Head:       1. No acute intracranial hemorrhage or calvarial fracture.   2. Patchy supratentorial white matter hypodensities, which are nonspecific, but favored to represent chronic small vessel ischemic change.      Cervical Spine:       1. No evidence of acute cervical fracture.   2. Degenerative changes to the cervical spine resulting in multilevel foraminal stenosis, as described above.             Finalized by Maida Sale, MD on 08/12/2021 12:50 AM. Dictated by Maida Sale, MD on 08/12/2021 12:43 AM.         CT SPINE CERVICAL WO CONTRAST   Final Result         Head:       1. No acute intracranial hemorrhage or calvarial fracture.   2. Patchy supratentorial white matter hypodensities, which are nonspecific, but favored to represent chronic small vessel ischemic change.      Cervical Spine:       1. No evidence  of acute cervical fracture.   2. Degenerative changes to the cervical spine resulting in multilevel foraminal stenosis, as described above.             Finalized by Maida Sale, MD on 08/12/2021 12:50 AM. Dictated by Maida Sale, MD on 08/12/2021 12:43 AM.         CHEST SINGLE VIEW   Final Result         1. Widening of the mediastinum, likely accentuated by low lung volumes and AP technique.   2. Mild cardiomegaly, with increased prominence of central pulmonary vasculature and diffusely increased interstitial markings bilaterally, suggestive of mild CHF/volume overload.          Finalized by Maida Sale, MD on 08/12/2021 1:28 AM. Dictated by Maida Sale, MD on 08/12/2021 1:25 AM.         PELVIS ANTEROPOSTERIOR    (Results Pending)   POC TRAUMA ACTIVATION FAST EXAM    (Results Pending)         EKG:      Medical Decision Making:  Pt seen and evaluated immediately upon arrival as a Trauma Activation.   ATLS protocol followed.   IV access was established.   Pt placed on oxygen prn.   Pt placed on cardiac and pulse oximetry monitors.   Initial labs and imaging tests were ordered by nursing via protocol/order set for the trauma team.   Emergency medicine team available to evaluate the airway and to address any acute airway needs/issues.      C-collar exchanged by EM resident and staff physician.   Available labs and imaging reviewed and noted as above.   Pt care discussed with the trauma team.   Labs and imaging ordered by the trauma team and to be followed by the trauma team.   Unless otherwise noted, all further labs, radiology orders, interventions, procedures, consultations and disposition decisions were done at the discretion of the trauma team/attending.   Pt care transferred to the trauma team and attending upon exit of the trauma bay.   Disposition per the trauma team: patient admitted to trauma service.         ED Course       Complexity of Problems Addressed  Patient's active diagnoses as well as contributing pre-existing medical problems include:  Clinical Impression   Trauma   Other closed fracture of right femur, unspecified portion of femur, initial encounter (HCC)     Evaluation performed for potential threat to life or bodily function during this visit given the initial differential diagnosis and clinical impression(s) as discussed previously in MDM/ED course.    Additional data reviewed:    ? History was obtained from an independent historian: EMS  ? Prior non-ED notes reviewed: Not in addition to what is mentioned above  ? Independent interpretation of diagnostic tests was performed by me: X-Ray: No obvious chest deformity, pneumothorax or fracture on his chest x-ray  ? Patient presentation/management was discussed with the following qualified health care professionals and/or other relevant professionals: Surgeon on Duty    Risk evaluation:    ? Diagnosis or treatment of patient condition impacted by social determinant of health: None  ? Tests Considered but not performed due to clinical scoring (if not mentioned in ED course, aside from what is implied by clinical scores listed):   ? Rationale regarding whether admission or escalation of care considered if not performed (if not mentioned in ED course, aside from what is implied by  clinical scores listed):     ED Scoring:                                Facility Administered Meds:  Medications   ondansetron (ZOFRAN) injection 4 mg (has no administration in time range)   polyethylene glycol 3350 (MIRALAX) packet 17 g (has no administration in time range)   dextrose 50% (D50) syringe 25-50 mL (has no administration in time range)   insulin aspart (U-100) (NOVOLOG FLEXPEN U-100 INSULIN) injection PEN 0-12 Units (has no administration in time range)   iohexoL (OMNIPAQUE-350) 350 mg/mL injection 100 mL (100 mL Intravenous Given 08/12/21 0044) sodium chloride PF 0.9% injection 50 mL (50 mL Intravenous Given 08/12/21 0044)       Clinical Impression:  Clinical Impression   Trauma   Other closed fracture of right femur, unspecified portion of femur, initial encounter (HCC)       Disposition/Follow up  ED Disposition     None        No follow-up provider specified.    Medications:  There are no discharge medications for this patient.      Procedure Notes:  Procedures       Attestation / Supervision:    Shea Evans, MD  Emergency Medicine, PGY-3  [facility]ach[facility] by [facility]  08/12/2021

## 2021-08-13 NOTE — Anesthesia Pre-Procedure Evaluation
Anesthesia Pre-Procedure Evaluation    Name: Jimmy Waters      MRN: [id number]     DOB: Jun 14, 1955     Age: 66 y.o.     Sex: male   _________________________________________________________________________     Procedure Info:   Procedure Information     Date/Time: 08/13/21 0800    Procedure: OPEN TREATMENT FEMORAL SHAFT FRACTURE WITH PLATE/ SCREWS WITH/ WITHOUT CERCLAGE (Right)    Location: MAIN OR 36 / Main OR/Periop    Surgeons: Rayford Halsted, MD          Physical Assessment  Vital Signs (last filed in past 24 hours):  BP: 182/98 (05/21 0520)  Temp: 36.7 ?C (98 ?F) (05/21 0314)  Pulse: 92 (05/21 0520)  Respirations: 18 PER MINUTE (05/21 0314)  SpO2: 95 % (05/21 0314)  O2 Device: None (Room air) (05/21 0314)      Patient History   Allergies   Allergen Reactions   ? Ambien [Zolpidem] AGITATION        Current Medications    Not on File         Review of Systems/Medical History        PONV Screening: Non-smoker and Postoperative opioids        Pulmonary           Cardiovascular          Beta Blocker therapy: Yes      Hypertension ( pta metoprolol and losartan),       Hyperlipidemia      GI/Hepatic/Renal        Renal disease:         R nephrolithiasis and hydronephrosis s/p 03/15/21 R ureteral stent placement, BPH w/ urinary retention and h/o recurrent urosepsis, and recent 05/12/21 R ureteral injury after Foley placement and balloon inflation into distal R ureter s/p R PCNT placement c/b MRSA bacteremia and subsequent conversion to R PCNU 3/28 c/b 8.3cm subcapsular hematoma and MDR E.coli urosepsis         Musculoskeletal         Fractures ( femur fracture)      Endocrine/Other       Diabetes, type 2; using insulin      Anemia ( hgb 8.8)      Malignancy ( thyroid CA ; 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    Abdominal Findings:       Obese    Neurological Findings:       Alert and oriented x 3    Constitutional findings:       No acute distress      Well-developed      Well-nourished       Diagnostic Tests  Hematology:   Lab Results   Component Value Date    HGB 8.8 08/12/2021    HCT 27.3 08/12/2021    PLTCT 263 08/12/2021    WBC 9.1 08/12/2021    MCV 86.2 08/12/2021    MCH 27.9 08/12/2021    MCHC 32.3 08/12/2021  MPV 8.1 08/12/2021    RDW 19.8 08/12/2021         General Chemistry:   Lab Results   Component Value Date    NA 139 08/12/2021    K 3.9 08/12/2021    CL 104 08/12/2021    CO2 25 08/12/2021    GAP 10 08/12/2021    BUN 22 08/12/2021    CR 1.30 08/12/2021    GLU 100 08/12/2021    CA 9.1 08/12/2021    OBSCA 1.21 08/12/2021      Coagulation:   Lab Results   Component Value Date    PT 11.0 08/12/2021    PTT 31.1 08/12/2021    INR 1.0 08/12/2021         Anesthesia Plan    ASA score: 3   Plan: general and regional for postoperative pain  Induction method: intravenous  NPO status: acceptable      Informed Consent  Anesthetic plan and risks discussed with patient.  Use of blood products discussed with patient  Blood Consent: consented      Plan discussed with: anesthesiologist, resident and CRNA.

## 2021-08-14 ENCOUNTER — Encounter: Admit: 2021-08-14 | Discharge: 2021-08-14 | Payer: MEDICARE

## 2021-08-14 MED ADMIN — TRAZODONE 50 MG PO TAB [8085]: 50 mg | ORAL | @ 03:00:00 | NDC 50111056001

## 2021-08-14 MED ADMIN — MELATONIN 5 MG PO TAB [168576]: 5 mg | ORAL | @ 03:00:00 | NDC 77333052025

## 2021-08-14 MED ADMIN — LACTATED RINGERS IV SOLP [4318]: 500 mL | INTRAVENOUS | @ 20:00:00 | Stop: 2021-08-14 | NDC 00338011704

## 2021-08-14 MED ADMIN — BUPROPION XL 150 MG PO TB24 [88619]: 300 mg | ORAL | @ 20:00:00 | NDC 68180031906

## 2021-08-14 MED ADMIN — LACTATED RINGERS IV SOLP [4318]: 500 mL | INTRAVENOUS | @ 15:00:00 | Stop: 2021-08-14 | NDC 00338011704

## 2021-08-14 MED ADMIN — LEVOTHYROXINE 200 MCG PO TAB [4426]: 325 ug | ORAL | @ 20:00:00 | NDC 68180097509

## 2021-08-14 MED ADMIN — LEVOTHYROXINE 125 MCG PO TAB [4424]: 325 ug | ORAL | @ 20:00:00 | NDC 00904695561

## 2021-08-14 MED ADMIN — SERTRALINE 100 MG PO TAB [78401]: 100 mg | ORAL | @ 20:00:00 | NDC 65862001330

## 2021-08-15 ENCOUNTER — Encounter: Admit: 2021-08-15 | Discharge: 2021-08-15 | Payer: MEDICARE

## 2021-08-15 MED ADMIN — MEROPENEM 1 GRAM IV SOLR [80713]: 2 g | INTRAVENOUS | @ 20:00:00 | NDC 55150020830

## 2021-08-15 MED ADMIN — MELATONIN 5 MG PO TAB [168576]: 5 mg | ORAL | @ 02:00:00 | NDC 77333052025

## 2021-08-15 MED ADMIN — LEVOTHYROXINE 200 MCG PO TAB [4426]: 325 ug | ORAL | @ 12:00:00 | NDC 68180097509

## 2021-08-15 MED ADMIN — SODIUM CHLORIDE 0.9 % IV SOLP [27838]: 2 g | INTRAVENOUS | @ 20:00:00 | NDC 00338004938

## 2021-08-15 MED ADMIN — TRAZODONE 50 MG PO TAB [8085]: 25 mg | ORAL | @ 02:00:00 | NDC 50111056001

## 2021-08-15 MED ADMIN — LEVOTHYROXINE 125 MCG PO TAB [4424]: 325 ug | ORAL | @ 12:00:00 | NDC 00904695561

## 2021-08-16 ENCOUNTER — Inpatient Hospital Stay: Admit: 2021-08-16 | Discharge: 2021-08-16 | Payer: MEDICARE

## 2021-08-16 MED ADMIN — MEROPENEM 1 GRAM IV SOLR [80713]: 2 g | INTRAVENOUS | @ 19:00:00 | NDC 55150020830

## 2021-08-16 MED ADMIN — SODIUM CHLORIDE 0.9 % IV SOLP [27838]: 2 g | INTRAVENOUS | @ 04:00:00 | NDC 00338004938

## 2021-08-16 MED ADMIN — LEVOTHYROXINE 125 MCG PO TAB [4424]: 325 ug | ORAL | @ 11:00:00 | NDC 00904695561

## 2021-08-16 MED ADMIN — MEROPENEM 1 GRAM IV SOLR [80713]: 2 g | INTRAVENOUS | @ 13:00:00 | NDC 55150020830

## 2021-08-16 MED ADMIN — MEROPENEM 1 GRAM IV SOLR [80713]: 2 g | INTRAVENOUS | @ 04:00:00 | NDC 55150020830

## 2021-08-16 MED ADMIN — GABAPENTIN 300 MG PO CAP [18308]: 300 mg | ORAL | @ 02:00:00 | NDC 67877022305

## 2021-08-16 MED ADMIN — MELATONIN 5 MG PO TAB [168576]: 5 mg | ORAL | @ 02:00:00 | NDC 77333052025

## 2021-08-16 MED ADMIN — SODIUM CHLORIDE 0.9 % IV SOLP [27838]: 2 g | INTRAVENOUS | @ 13:00:00 | NDC 00338004938

## 2021-08-16 MED ADMIN — LEVOTHYROXINE 200 MCG PO TAB [4426]: 325 ug | ORAL | @ 11:00:00 | NDC 68180097509

## 2021-08-16 MED ADMIN — POTASSIUM CHLORIDE 20 MEQ PO TBTQ [35943]: 40 meq | ORAL | @ 17:00:00 | Stop: 2021-08-16 | NDC 00832532511

## 2021-08-16 MED ADMIN — SODIUM CHLORIDE 0.9 % IV SOLP [27838]: 2 g | INTRAVENOUS | @ 19:00:00 | NDC 00338004918

## 2021-08-17 ENCOUNTER — Encounter: Admit: 2021-08-17 | Discharge: 2021-08-17 | Payer: MEDICARE

## 2021-08-17 ENCOUNTER — Inpatient Hospital Stay: Admit: 2021-08-17 | Discharge: 2021-08-17 | Payer: MEDICARE

## 2021-08-17 MED ADMIN — GABAPENTIN 300 MG PO CAP [18308]: 300 mg | ORAL | @ 01:00:00 | NDC 67877022305

## 2021-08-17 MED ADMIN — LEVOTHYROXINE 200 MCG PO TAB [4426]: 325 ug | ORAL | @ 11:00:00 | NDC 68180097509

## 2021-08-17 MED ADMIN — MEROPENEM 1 GRAM IV SOLR [80713]: 2 g | INTRAVENOUS | @ 04:00:00 | NDC 55150020830

## 2021-08-17 MED ADMIN — SODIUM CHLORIDE 0.9 % IV SOLP [27838]: 2 g | INTRAVENOUS | @ 21:00:00 | NDC 00338004938

## 2021-08-17 MED ADMIN — MELATONIN 5 MG PO TAB [168576]: 5 mg | ORAL | @ 01:00:00 | NDC 77333052025

## 2021-08-17 MED ADMIN — SODIUM CHLORIDE 0.9 % IV SOLP [27838]: 2 g | INTRAVENOUS | @ 04:00:00 | NDC 00338004938

## 2021-08-17 MED ADMIN — MEROPENEM 1 GRAM IV SOLR [80713]: 2 g | INTRAVENOUS | @ 11:00:00 | NDC 55150020830

## 2021-08-17 MED ADMIN — SODIUM CHLORIDE 0.9 % IV SOLP [27838]: 2 g | INTRAVENOUS | @ 11:00:00 | NDC 00338004938

## 2021-08-17 MED ADMIN — LEVOTHYROXINE 125 MCG PO TAB [4424]: 325 ug | ORAL | @ 11:00:00 | NDC 00904695561

## 2021-08-17 MED ADMIN — BUPROPION XL 150 MG PO TB24 [88619]: 300 mg | ORAL | @ 15:00:00 | NDC 68180031906

## 2021-08-17 MED ADMIN — LOSARTAN 25 MG PO TAB [80886]: 25 mg | ORAL | @ 21:00:00 | NDC 00904704761

## 2021-08-17 MED ADMIN — MEROPENEM 1 GRAM IV SOLR [80713]: 2 g | INTRAVENOUS | @ 21:00:00 | NDC 55150020830

## 2021-08-17 MED ADMIN — LABETALOL 5 MG/ML IV SYRG [86579]: 10 mg | INTRAVENOUS | @ 01:00:00 | Stop: 2021-08-17 | NDC 00409233924

## 2021-08-17 NOTE — Telephone Encounter
Patient;'s wife called to update [place] on patient's condition. He is scheduled to see [place] tomorrow but is currently inpatient on [address] follow a femur fracture and surgery (patient hospitalization is documented in his other chart, marked for merge). She asked if he could be seen while he is inpatient. returned her call. Informed her we cannot see him while hospitalized but we can r/s his appointment for when he is discharged. She understood and no further questions at this time.

## 2021-08-18 MED ADMIN — LEVOTHYROXINE 125 MCG PO TAB [4424]: 325 ug | ORAL | @ 11:00:00 | NDC 00904695561

## 2021-08-18 MED ADMIN — LOSARTAN 25 MG PO TAB [80886]: 25 mg | ORAL | @ 14:00:00 | Stop: 2021-08-18 | NDC 00904704761

## 2021-08-18 MED ADMIN — MEROPENEM 1 GRAM IV SOLR [80713]: 2 g | INTRAVENOUS | @ 04:00:00 | NDC 55150020830

## 2021-08-18 MED ADMIN — BUPROPION XL 150 MG PO TB24 [88619]: 300 mg | ORAL | @ 14:00:00 | NDC 68180031906

## 2021-08-18 MED ADMIN — MEROPENEM 1 GRAM IV SOLR [80713]: 2 g | INTRAVENOUS | @ 19:00:00 | Stop: 2021-08-21 | NDC 55150020830

## 2021-08-18 MED ADMIN — OXYCODONE 5 MG PO TAB [10814]: 10 mg | ORAL | @ 01:00:00 | NDC 68084035411

## 2021-08-18 MED ADMIN — SODIUM CHLORIDE 0.9 % IV SOLP [27838]: 2 g | INTRAVENOUS | @ 04:00:00 | NDC 00338004938

## 2021-08-18 MED ADMIN — OXYCODONE 5 MG PO TAB [10814]: 5 mg | ORAL | @ 19:00:00 | NDC 68084035411

## 2021-08-18 MED ADMIN — MEROPENEM 1 GRAM IV SOLR [80713]: 2 g | INTRAVENOUS | @ 11:00:00 | Stop: 2021-08-21 | NDC 55150020830

## 2021-08-18 MED ADMIN — GABAPENTIN 300 MG PO CAP [18308]: 300 mg | ORAL | @ 01:00:00 | NDC 67877022305

## 2021-08-18 MED ADMIN — SERTRALINE 50 MG PO TAB [82509]: 50 mg | ORAL | @ 14:00:00 | NDC 00904692561

## 2021-08-18 MED ADMIN — LACTATED RINGERS IV SOLP [4318]: 500 mL | INTRAVENOUS | @ 18:00:00 | Stop: 2021-08-18 | NDC 00338011704

## 2021-08-18 MED ADMIN — METHOCARBAMOL 500 MG PO TAB [4971]: 500 mg | ORAL | @ 19:00:00 | NDC 00904705761

## 2021-08-18 MED ADMIN — MELATONIN 5 MG PO TAB [168576]: 5 mg | ORAL | @ 01:00:00 | NDC 77333052025

## 2021-08-18 MED ADMIN — TIZANIDINE 4 MG PO TAB [14793]: 2 mg | ORAL | @ 15:00:00 | Stop: 2021-08-18 | NDC 00904641861

## 2021-08-18 MED ADMIN — SODIUM CHLORIDE 0.9 % IV SOLP [27838]: 2 g | INTRAVENOUS | @ 11:00:00 | Stop: 2021-08-21 | NDC 00338004938

## 2021-08-18 MED ADMIN — LEVOTHYROXINE 200 MCG PO TAB [4426]: 325 ug | ORAL | @ 11:00:00 | NDC 68180097509

## 2021-08-18 MED ADMIN — OXYCODONE 5 MG PO TAB [10814]: 10 mg | ORAL | @ 14:00:00 | Stop: 2021-08-18 | NDC 68084035411

## 2021-08-18 MED ADMIN — SODIUM CHLORIDE 0.9 % IV SOLP [27838]: 2 g | INTRAVENOUS | @ 19:00:00 | Stop: 2021-08-21 | NDC 00338004938

## 2021-08-19 MED ADMIN — MEROPENEM 1 GRAM IV SOLR [80713]: 2 g | INTRAVENOUS | @ 11:00:00 | Stop: 2021-08-21 | NDC 55150020830

## 2021-08-19 MED ADMIN — BUPROPION XL 150 MG PO TB24 [88619]: 300 mg | ORAL | @ 15:00:00 | NDC 68180031906

## 2021-08-19 MED ADMIN — METOPROLOL TARTRATE 25 MG PO TAB [37637]: 25 mg | ORAL | @ 14:00:00 | NDC 62584026511

## 2021-08-19 MED ADMIN — LEVOTHYROXINE 125 MCG PO TAB [4424]: 325 ug | ORAL | @ 12:00:00 | NDC 00904695561

## 2021-08-19 MED ADMIN — METHOCARBAMOL 500 MG PO TAB [4971]: 500 mg | ORAL | @ 02:00:00 | NDC 00904705761

## 2021-08-19 MED ADMIN — MEROPENEM 1 GRAM IV SOLR [80713]: 2 g | INTRAVENOUS | @ 03:00:00 | Stop: 2021-08-21 | NDC 55150020830

## 2021-08-19 MED ADMIN — METHOCARBAMOL 500 MG PO TAB [4971]: 500 mg | ORAL | @ 14:00:00 | NDC 00904705761

## 2021-08-19 MED ADMIN — SODIUM CHLORIDE 0.9 % IV SOLP [27838]: 2 g | INTRAVENOUS | @ 11:00:00 | Stop: 2021-08-21 | NDC 00338004938

## 2021-08-19 MED ADMIN — OXYCODONE 5 MG PO TAB [10814]: 5 mg | ORAL | @ 09:00:00 | Stop: 2021-08-19 | NDC 00904696661

## 2021-08-19 MED ADMIN — SERTRALINE 50 MG PO TAB [82509]: 50 mg | ORAL | @ 15:00:00 | NDC 00904692561

## 2021-08-19 MED ADMIN — SODIUM CHLORIDE 0.9 % IV SOLP [27838]: 2 g | INTRAVENOUS | @ 19:00:00 | Stop: 2021-08-21 | NDC 00338004938

## 2021-08-19 MED ADMIN — OXYCODONE 5 MG PO TAB [10814]: 5 mg | ORAL | @ 02:00:00 | NDC 68084035411

## 2021-08-19 MED ADMIN — OXYCODONE 5 MG PO TAB [10814]: 10 mg | ORAL | @ 22:00:00 | NDC 00904696661

## 2021-08-19 MED ADMIN — MEROPENEM 1 GRAM IV SOLR [80713]: 2 g | INTRAVENOUS | @ 19:00:00 | Stop: 2021-08-21 | NDC 55150020830

## 2021-08-19 MED ADMIN — MELATONIN 5 MG PO TAB [168576]: 5 mg | ORAL | @ 02:00:00 | NDC 77333052025

## 2021-08-19 MED ADMIN — SODIUM CHLORIDE 0.9 % IV SOLP [27838]: 2 g | INTRAVENOUS | @ 03:00:00 | Stop: 2021-08-21 | NDC 00338004938

## 2021-08-19 MED ADMIN — GABAPENTIN 300 MG PO CAP [18308]: 300 mg | ORAL | @ 02:00:00 | NDC 67877022305

## 2021-08-19 MED ADMIN — METOPROLOL TARTRATE 25 MG PO TAB [37637]: 25 mg | ORAL | @ 02:00:00 | NDC 62584026511

## 2021-08-19 MED ADMIN — OXYCODONE 5 MG PO TAB [10814]: 5 mg | ORAL | @ 16:00:00 | Stop: 2021-08-19 | NDC 68084035411

## 2021-08-20 MED ADMIN — MEROPENEM 1 GRAM IV SOLR [80713]: 2 g | INTRAVENOUS | @ 03:00:00 | Stop: 2021-08-21 | NDC 55150020830

## 2021-08-20 MED ADMIN — OXYCODONE 5 MG PO TAB [10814]: 5 mg | ORAL | @ 14:00:00 | Stop: 2021-08-20 | NDC 00904696661

## 2021-08-20 MED ADMIN — MEROPENEM 1 GRAM IV SOLR [80713]: 2 g | INTRAVENOUS | @ 20:00:00 | Stop: 2021-08-21 | NDC 55150020830

## 2021-08-20 MED ADMIN — TIZANIDINE 4 MG PO TAB [14793]: 2 mg | ORAL | @ 02:00:00 | NDC 00904641861

## 2021-08-20 MED ADMIN — MEROPENEM 1 GRAM IV SOLR [80713]: 2 g | INTRAVENOUS | @ 11:00:00 | Stop: 2021-08-21 | NDC 55150020830

## 2021-08-20 MED ADMIN — TIZANIDINE 4 MG PO TAB [14793]: 2 mg | ORAL | @ 20:00:00 | NDC 00904641861

## 2021-08-20 MED ADMIN — METOPROLOL TARTRATE 25 MG PO TAB [37637]: 50 mg | ORAL | @ 14:00:00 | NDC 62584026511

## 2021-08-20 MED ADMIN — BUPROPION XL 150 MG PO TB24 [88619]: 300 mg | ORAL | @ 14:00:00 | NDC 68180031906

## 2021-08-20 MED ADMIN — SODIUM CHLORIDE 0.9 % IV SOLP [27838]: 2 g | INTRAVENOUS | @ 03:00:00 | Stop: 2021-08-21 | NDC 00338004938

## 2021-08-20 MED ADMIN — SERTRALINE 50 MG PO TAB [82509]: 50 mg | ORAL | @ 14:00:00 | NDC 00904692561

## 2021-08-20 MED ADMIN — METOPROLOL TARTRATE 25 MG PO TAB [37637]: 25 mg | ORAL | @ 02:00:00 | NDC 62584026511

## 2021-08-20 MED ADMIN — SODIUM CHLORIDE 0.9 % IV SOLP [27838]: 2 g | INTRAVENOUS | @ 11:00:00 | Stop: 2021-08-21 | NDC 00338004938

## 2021-08-20 MED ADMIN — MELATONIN 5 MG PO TAB [168576]: 5 mg | ORAL | @ 02:00:00 | NDC 77333052025

## 2021-08-20 MED ADMIN — LEVOTHYROXINE 200 MCG PO TAB [4426]: 325 ug | ORAL | @ 11:00:00 | NDC 68180097509

## 2021-08-20 MED ADMIN — LEVOTHYROXINE 125 MCG PO TAB [4424]: 325 ug | ORAL | @ 11:00:00 | NDC 00904695561

## 2021-08-20 MED ADMIN — GABAPENTIN 300 MG PO CAP [18308]: 300 mg | ORAL | @ 02:00:00 | NDC 67877022305

## 2021-08-20 MED ADMIN — OXYCODONE 5 MG PO TAB [10814]: 10 mg | ORAL | @ 09:00:00 | NDC 00904696661

## 2021-08-20 MED ADMIN — OXYCODONE 5 MG PO TAB [10814]: 10 mg | ORAL | @ 19:00:00 | NDC 00904696661

## 2021-08-20 MED ADMIN — SODIUM CHLORIDE 0.9 % IV SOLP [27838]: 2 g | INTRAVENOUS | @ 20:00:00 | Stop: 2021-08-21 | NDC 00338004938

## 2021-08-20 MED ADMIN — METHOCARBAMOL 500 MG PO TAB [4971]: 500 mg | ORAL | @ 02:00:00 | NDC 00904705761

## 2021-08-21 MED ADMIN — METOPROLOL TARTRATE 25 MG PO TAB [37637]: 50 mg | ORAL | @ 01:00:00 | NDC 62584026511

## 2021-08-21 MED ADMIN — OXYCODONE 5 MG PO TAB [10814]: 10 mg | ORAL | @ 10:00:00 | NDC 00904696661

## 2021-08-21 MED ADMIN — SODIUM CHLORIDE 0.9 % IV SOLP [27838]: 2 g | INTRAVENOUS | @ 04:00:00 | Stop: 2021-08-21 | NDC 00338004938

## 2021-08-21 MED ADMIN — TIZANIDINE 4 MG PO TAB [14793]: 2 mg | ORAL | @ 21:00:00 | NDC 00904641861

## 2021-08-21 MED ADMIN — TIZANIDINE 4 MG PO TAB [14793]: 2 mg | ORAL | @ 10:00:00 | NDC 00904641861

## 2021-08-21 MED ADMIN — MEROPENEM 1 GRAM IV SOLR [80713]: 2 g | INTRAVENOUS | @ 04:00:00 | Stop: 2021-08-21 | NDC 55150020830

## 2021-08-21 MED ADMIN — LOSARTAN 25 MG PO TAB [80886]: 12.5 mg | ORAL | @ 14:00:00 | NDC 00904704761

## 2021-08-21 MED ADMIN — LEVOTHYROXINE 200 MCG PO TAB [4426]: 325 ug | ORAL | @ 13:00:00 | NDC 68180097509

## 2021-08-21 MED ADMIN — MELATONIN 5 MG PO TAB [168576]: 5 mg | ORAL | @ 01:00:00 | NDC 77333052025

## 2021-08-21 MED ADMIN — OXYCODONE 5 MG PO TAB [10814]: 10 mg | ORAL | @ 23:00:00 | NDC 00406055223

## 2021-08-21 MED ADMIN — OXYCODONE 5 MG PO TAB [10814]: 10 mg | ORAL | @ 15:00:00 | NDC 00406055223

## 2021-08-21 MED ADMIN — METOPROLOL TARTRATE 25 MG PO TAB [37637]: 50 mg | ORAL | @ 14:00:00 | NDC 62584026511

## 2021-08-21 MED ADMIN — BUPROPION XL 150 MG PO TB24 [88619]: 300 mg | ORAL | @ 14:00:00 | NDC 68180031906

## 2021-08-21 MED ADMIN — TAMSULOSIN 0.4 MG PO CAP [80077]: 0.4 mg | ORAL | @ 01:00:00 | NDC 00904640161

## 2021-08-21 MED ADMIN — GABAPENTIN 300 MG PO CAP [18308]: 300 mg | ORAL | @ 01:00:00 | NDC 67877022305

## 2021-08-21 MED ADMIN — SERTRALINE 50 MG PO TAB [82509]: 50 mg | ORAL | @ 14:00:00 | NDC 00904692561

## 2021-08-21 MED ADMIN — LEVOTHYROXINE 125 MCG PO TAB [4424]: 325 ug | ORAL | @ 13:00:00 | NDC 00904695561

## 2021-08-21 MED ADMIN — OXYCODONE 5 MG PO TAB [10814]: 10 mg | ORAL | @ 02:00:00 | NDC 00904696661

## 2021-08-21 MED ADMIN — OXYCODONE 5 MG PO TAB [10814]: 10 mg | ORAL | @ 10:00:00 | NDC 00406055223

## 2021-08-22 ENCOUNTER — Inpatient Hospital Stay: Admit: 2021-08-22 | Discharge: 2021-08-29 | Payer: MEDICARE

## 2021-08-22 VITALS — HR 75

## 2021-08-22 VITALS — BP 138/69 | HR 78 | Temp 98.60000°F | Ht 72.0 in | Wt 231.3 lb

## 2021-08-22 VITALS — BP 139/74 | HR 76 | Temp 98.00000°F

## 2021-08-22 VITALS — BP 151/75 | HR 81 | Temp 98.20000°F

## 2021-08-22 DIAGNOSIS — S72309D Unspecified fracture of shaft of unspecified femur, subsequent encounter for closed fracture with routine healing: Principal | ICD-10-CM

## 2021-08-22 DIAGNOSIS — M978XXA Periprosthetic fracture around other internal prosthetic joint, initial encounter: Secondary | ICD-10-CM

## 2021-08-22 DIAGNOSIS — Z8781 Personal history of (healed) traumatic fracture: Secondary | ICD-10-CM

## 2021-08-22 LAB — POC GLUCOSE: POC GLUCOSE: 168 mg/dL — ABNORMAL HIGH (ref 70–100)

## 2021-08-22 MED ORDER — ONDANSETRON 4 MG PO TBDI
4 mg | ORAL | 0 refills | Status: DC | PRN
Start: 2021-08-22 — End: 2021-08-26
  Administered 2021-08-23 – 2021-08-26 (×4): 4 mg via ORAL

## 2021-08-22 MED ORDER — MELATONIN 5 MG PO TAB
5 mg | Freq: Every evening | ORAL | 0 refills | Status: DC
Start: 2021-08-22 — End: 2021-08-30
  Administered 2021-08-23 – 2021-08-29 (×7): 5 mg via ORAL

## 2021-08-22 MED ORDER — ATORVASTATIN 20 MG PO TAB
20 mg | Freq: Every day | ORAL | 0 refills | Status: DC
Start: 2021-08-22 — End: 2021-08-30
  Administered 2021-08-23 – 2021-08-29 (×7): 20 mg via ORAL

## 2021-08-22 MED ORDER — ALUMINUM-MAGNESIUM HYDROXIDE 200-200 MG/5 ML PO SUSP
30 mL | ORAL | 0 refills | Status: DC | PRN
Start: 2021-08-22 — End: 2021-08-30

## 2021-08-22 MED ORDER — POLYETHYLENE GLYCOL 3350 17 GRAM PO PWPK
17 g | Freq: Every day | ORAL | 0 refills | Status: DC
Start: 2021-08-22 — End: 2021-08-25

## 2021-08-22 MED ORDER — OXYCODONE 5 MG PO TAB
10 mg | ORAL | 0 refills | Status: DC | PRN
Start: 2021-08-22 — End: 2021-08-25
  Administered 2021-08-23 – 2021-08-25 (×6): 10 mg via ORAL

## 2021-08-22 MED ORDER — SENNOSIDES 8.6 MG PO TAB
1 | Freq: Two times a day (BID) | ORAL | 0 refills | Status: DC
Start: 2021-08-22 — End: 2021-08-22

## 2021-08-22 MED ORDER — TIZANIDINE 4 MG PO TAB
2 mg | ORAL | 0 refills | Status: DC | PRN
Start: 2021-08-22 — End: 2021-08-25
  Administered 2021-08-23 – 2021-08-25 (×3): 2 mg via ORAL

## 2021-08-22 MED ORDER — IMS MIXTURE TEMPLATE
325 ug | Freq: Every day | ORAL | 0 refills | Status: DC
Start: 2021-08-22 — End: 2021-08-30
  Administered 2021-08-23 – 2021-08-29 (×14): 325 ug via ORAL

## 2021-08-22 MED ORDER — MAGNESIUM HYDROXIDE 400 MG/5 ML PO SUSP
30 mL | ORAL | 0 refills | Status: DC | PRN
Start: 2021-08-22 — End: 2021-08-30

## 2021-08-22 MED ORDER — SENNOSIDES 8.6 MG PO TAB
1 | Freq: Two times a day (BID) | ORAL | 0 refills | Status: DC | PRN
Start: 2021-08-22 — End: 2021-08-30

## 2021-08-22 MED ORDER — INSULIN GLARGINE 100 UNIT/ML (3 ML) SC INJ PEN
15 [IU] | Freq: Every evening | SUBCUTANEOUS | 0 refills | Status: DC
Start: 2021-08-22 — End: 2021-08-30
  Administered 2021-08-23: 03:00:00 15 [IU] via SUBCUTANEOUS

## 2021-08-22 MED ORDER — OXYCODONE 5 MG PO TAB
5 mg | Freq: Once | ORAL | 0 refills | Status: CP
Start: 2021-08-22 — End: ?
  Administered 2021-08-23: 02:00:00 5 mg via ORAL

## 2021-08-22 MED ORDER — BISACODYL 10 MG RE SUPP
10 mg | Freq: Every day | RECTAL | 0 refills | Status: DC | PRN
Start: 2021-08-22 — End: 2021-08-30

## 2021-08-22 MED ORDER — METOPROLOL TARTRATE 50 MG PO TAB
50 mg | Freq: Two times a day (BID) | ORAL | 0 refills | Status: DC
Start: 2021-08-22 — End: 2021-08-30
  Administered 2021-08-23 – 2021-08-29 (×14): 50 mg via ORAL

## 2021-08-22 MED ORDER — BUPROPION XL 150 MG PO TB24
300 mg | Freq: Every day | ORAL | 0 refills | Status: DC
Start: 2021-08-22 — End: 2021-08-30
  Administered 2021-08-23 – 2021-08-29 (×7): 300 mg via ORAL

## 2021-08-22 MED ORDER — HEPARIN, PORCINE (PF) 5,000 UNIT/0.5 ML IJ SYRG
5000 [IU] | SUBCUTANEOUS | 0 refills | Status: DC
Start: 2021-08-22 — End: 2021-08-30
  Administered 2021-08-23 – 2021-08-29 (×21): 5000 [IU] via SUBCUTANEOUS

## 2021-08-22 MED ORDER — LOSARTAN 25 MG PO TAB
12.5 mg | Freq: Every day | ORAL | 0 refills | Status: DC
Start: 2021-08-22 — End: 2021-08-30
  Administered 2021-08-23 – 2021-08-29 (×7): 12.5 mg via ORAL

## 2021-08-22 MED ORDER — DEXTROSE 50 % IN WATER (D50W) IV SYRG
12.5-25 g | INTRAVENOUS | 0 refills | Status: DC | PRN
Start: 2021-08-22 — End: 2021-08-30

## 2021-08-22 MED ORDER — ACETAMINOPHEN 500 MG PO TAB
1000 mg | ORAL | 0 refills | Status: DC
Start: 2021-08-22 — End: 2021-08-25
  Administered 2021-08-22 – 2021-08-24 (×5): 1000 mg via ORAL
  Administered 2021-08-24: 05:00:00
  Administered 2021-08-24 – 2021-08-25 (×5): 1000 mg via ORAL

## 2021-08-22 MED ORDER — OXYCODONE 5 MG PO TAB
5 mg | ORAL | 0 refills | Status: DC | PRN
Start: 2021-08-22 — End: 2021-08-25
  Administered 2021-08-23 – 2021-08-24 (×3): 5 mg via ORAL

## 2021-08-22 MED ORDER — INSULIN ASPART 100 UNIT/ML SC FLEXPEN
0-12 [IU] | Freq: Three times a day (TID) | SUBCUTANEOUS | 0 refills | Status: DC
Start: 2021-08-22 — End: 2021-08-30
  Administered 2021-08-29: 22:00:00 2 [IU] via SUBCUTANEOUS

## 2021-08-22 MED ORDER — TAMSULOSIN 0.4 MG PO CAP
.4 mg | Freq: Every evening | ORAL | 0 refills | Status: DC
Start: 2021-08-22 — End: 2021-08-30
  Administered 2021-08-23 – 2021-08-29 (×7): 0.4 mg via ORAL

## 2021-08-22 MED ORDER — DUTASTERIDE 0.5 MG PO CAP
.5 mg | Freq: Every day | ORAL | 0 refills | Status: DC
Start: 2021-08-22 — End: 2021-08-30
  Administered 2021-08-23 – 2021-08-29 (×7): 0.5 mg via ORAL

## 2021-08-22 MED ORDER — ONDANSETRON HCL (PF) 4 MG/2 ML IJ SOLN
4 mg | INTRAVENOUS | 0 refills | Status: DC | PRN
Start: 2021-08-22 — End: 2021-08-30

## 2021-08-22 MED ORDER — SERTRALINE 50 MG PO TAB
50 mg | Freq: Every day | ORAL | 0 refills | Status: DC
Start: 2021-08-22 — End: 2021-08-30
  Administered 2021-08-23 – 2021-08-29 (×7): 50 mg via ORAL

## 2021-08-22 MED ORDER — ACETAMINOPHEN 500 MG PO TAB
1000 mg | ORAL | 0 refills | Status: DC | PRN
Start: 2021-08-22 — End: 2021-08-22

## 2021-08-22 MED ADMIN — OXYCODONE 5 MG PO TAB [10814]: 10 mg | ORAL | @ 20:00:00 | Stop: 2021-08-22 | NDC 00406055223

## 2021-08-22 MED ADMIN — GABAPENTIN 300 MG PO CAP [18308]: 300 mg | ORAL | @ 03:00:00 | NDC 67877022305

## 2021-08-22 MED ADMIN — MELATONIN 5 MG PO TAB [168576]: 5 mg | ORAL | @ 03:00:00 | NDC 77333052025

## 2021-08-22 MED ADMIN — TIZANIDINE 4 MG PO TAB [14793]: 2 mg | ORAL | @ 05:00:00 | Stop: 2021-08-22 | NDC 00904641861

## 2021-08-22 MED ADMIN — BUPROPION XL 150 MG PO TB24 [88619]: 300 mg | ORAL | @ 13:00:00 | Stop: 2021-08-22 | NDC 68180031906

## 2021-08-22 MED ADMIN — LOSARTAN 25 MG PO TAB [80886]: 12.5 mg | ORAL | @ 13:00:00 | Stop: 2021-08-22 | NDC 00904704761

## 2021-08-22 MED ADMIN — SERTRALINE 50 MG PO TAB [82509]: 50 mg | ORAL | @ 13:00:00 | Stop: 2021-08-22 | NDC 00904692561

## 2021-08-22 MED ADMIN — METOPROLOL TARTRATE 25 MG PO TAB [37637]: 50 mg | ORAL | @ 03:00:00 | NDC 62584026511

## 2021-08-22 MED ADMIN — TAMSULOSIN 0.4 MG PO CAP [80077]: 0.4 mg | ORAL | @ 03:00:00 | NDC 00904640161

## 2021-08-22 MED ADMIN — OXYCODONE 5 MG PO TAB [10814]: 10 mg | ORAL | @ 13:00:00 | Stop: 2021-08-22 | NDC 00406055223

## 2021-08-22 MED ADMIN — LEVOTHYROXINE 125 MCG PO TAB [4424]: 325 ug | ORAL | @ 12:00:00 | Stop: 2021-08-22 | NDC 00904695561

## 2021-08-22 MED ADMIN — METOPROLOL TARTRATE 25 MG PO TAB [37637]: 50 mg | ORAL | @ 13:00:00 | Stop: 2021-08-22 | NDC 62584026511

## 2021-08-22 MED ADMIN — ACETAMINOPHEN 500 MG PO TAB [102]: 1000 mg | ORAL | @ 16:00:00 | Stop: 2021-08-22 | NDC 00904672080

## 2021-08-22 MED ADMIN — LEVOTHYROXINE 200 MCG PO TAB [4426]: 325 ug | ORAL | @ 12:00:00 | Stop: 2021-08-22 | NDC 68180097509

## 2021-08-22 MED ADMIN — TIZANIDINE 4 MG PO TAB [14793]: 2 mg | ORAL | @ 16:00:00 | Stop: 2021-08-22 | NDC 00904641861

## 2021-08-22 NOTE — H&P (View-Only)
Physician Medicine & Rehabilitation History & Physical Note    Date of Service:  08/22/2021  Jimmy Waters is a 66 y.o. male.     DOB: 08-28-1955                  MRN#:  [id number]  Primary Insurance: MEDICARE  Secondary Insurance: Vedia Pereyra Insurance:   Financial Class:  Medicare  Date of Admission:  (Not on file)  Precautions: Fall, Isolation  Weight bearing Precautions:  RLE NWB, RLE knee immobilizer    Active Problems  Active Problems:    Peri-prosthetic femoral shaft fracture    HTN (hypertension)    HLD (hyperlipidemia)    DM2 (diabetes mellitus, type 2) (HCC)    Urethral injury    Fall    Pain, acute due to trauma    Impaired mobility and activities of daily living    Acute blood loss anemia    Hypothyroid    Nephrostomy status (HCC)      Assessment & Plan:  Jimmy Waters is a 66 y.o. male admitted to [facility] on (Not on file) with the following issues:    Right lower extremity periprostatic femur fracture status post ORIF 5/21  Encephalopathy    Impairments: cognitive impairments, pain, poor activity tolerance and weakness  Activity Limitations:  eating, grooming, bathing, dressing - upper, dressing - lower, toileting, transfers, ambulation and stairs  Participation Restrictions: unable to return home safely    Rehabilitation Plan  Patient will receive?Physical therapy, Occupational therapy and Speech therapy?each 60 minutes a day, 5 days a week?for a total of 3 hours daily (minimum) for the duration of the rehabilitation stay within an interdisciplinary rehabilitation program with case manager/social worker, dietician, neuropsychologist, rehab nursing and PM&R oversight.  ?  Rehabilitation Prognosis:?Fair?to good  Medical Prognosis: The patient is deemed medically stable to tolerate, benefit from, and participate in IRF level?services.  Tolerance for three hours of therapy a day:?Good.?Patient has participated well with therapies since?08-14-2021?in the acute care setting and is anticipated to tolerate therapy as required.     Goals/Barriers/Facilitators  Family / Patient Goals:?return home with family assistance  Mobility Goals:?Overall goal is?Supervision or touching assistance?and Physical Therapy will evaluate and treat ambulation/wheelchair use and bed transfers  Activities of Daily Living (ADLs) Goals:?Overall goal is?Supervision or touching assistance?and Occupational therapy will evaluate and treat basic Activities of Daily Living  Cognition / Communication Goals:?Overall goal is?Supervision or touching assistance?and Speech therapy will evaluate and treat cognition and communication deficits and assess for safe swallow     Barriers & Interventions:?  Caregiver Apprehension:?Arrange caregiver support and discuss barriers and patient progress with caregivers when appropriate.  High Burden of Care:?Initiate interdisciplinary rehabilitation to improve functional independence and reduce burden of care.  Home Accessibility:?Work with family to obtain home measurements of doorways, bathroom access, and stair set-up to plan for appropriate adaptive equipment and home modifications as necessary.  Medication Education:??Pharmacist and nursing staff to provide education to patient and family regarding medication side effects, special precautions, and safe administration.  Facilitators:?good family / social support, patient motivation, improving strength / endurance and improving medical condition  Current Medical Problems/Risks of Medical Complications/Management    Right periprosthetic femur fracture s/p ORIF  History of multiple joint replacements  Impairments: reduced activity tolerance, reduced ROM of BLE, LLE weakness and NWB status, poor endurance, post-operative pain  Impaired ADLs & Mobility  Fall Risk   > PT/OT/SLP to address ongoing functional  deficits   > Nonweightbearing right lower extremity until Ortho follow-up   > Orthopedic surgery postoperative visit scheduled for 6/6, may need to reschedule pending length of rehabilitation stay    Acute postoperative pain  Pain due to trauma   > Heat/Ice PRN   > Tylenol 1000 mg every 6 hours as needed   > Oxycodone 5, 10 every 6 hours as needed   > Tizanidine 2 mg every 8 hours as needed    Bowel Management  Bladder Management  History of BPH   > Bowel Regimen: PRN stool softeners per patient preference   > PVR x3<125 mL, ISC for voumes >353mL   > Tamsulosin 0.4 milligrams nightly    History of right ureteral injury  Presence of right nephrostomy tube  History of recent 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  -Urine culture with Pseudomonas, s/p meropenem 5/29   > Nephrostomy tube flushes twice daily   > Will need urology follow-up after discharge    Acute blood loss anemia   > Monitor with CBC daily x5 days, MWF thereafter    Dyslipidemia   > Atorvastatin 20 mg daily    Type 2 diabetes   > NovoLog 0-12 units 3 times daily with meals   > Lantus 15 units nightly   > Diabetic diet    Hypothyroidism   > 3 and 25 mcg daily    Hypertension   > Losartan 12.5 mg daily   > Metoprolol 50 mg twice daily    Wound:    Wounds 08/13/21 0848 Surgical incision Right;Outer Thigh (Active)   08/13/21 0848 Thigh   Wound Type: Surgical incision   Pressure Injury Stages (For Pressure Injury Wound Type Only):    Pressure Injury Present On Inpatient Admission:    If this pressure injury is suspected to be device related, please select the device::    Wound/Pressure Injury Orientation: Right;Outer   Wound Location Comments:    ZXWound Location:    Wound Description (Comments):    Wound Type::    Wound Dressing Status Intact 08/22/21 0832   Wound Dressing and / or Treatment Xeroform gauze 08/22/21 0832   Wound Securement / Protective Device Ace wrap 08/22/21 0832   Wound Drainage Amount None 08/22/21 0832   Wound Base Assessment Dressing intact, base not assessed 08/22/21 0832   Surrounding Skin Assessment Dry;Intact 08/22/21 0832   Wound Site Closure Sutures 08/22/21 1610   Number of days: 9            Nutrition: Consult dietician    BMI: There is no height or weight on file to calculate BMI.    Mental Health: consult neuropsychology to provide support / counseling    > PTA bupropion 300 mg daily   > PTA sertraline 50 mg daily    DVT Prophylaxis: Heparin    History of Present Illness:    Hospital Course:    This is a 66 year old male?with a past medical history of??significant for hypertension, diabetes, thyroid cancer, BPH with urinary retention, status post iatrogenic ureter injury and urosepsis bacteremia. Patient [facility] after a ground-level fall at home, status post right lower extremity periprosthetic fracture, status post ORIF on 5/21.?ID was consulted 5/21 given concern for R kidney infection on CT and bacteruria w/ indwelling nephrostomy tube. He was started on Meropenem and completed this 5/28.?Acute hospitalization has been complicated by?cognitive impairment,?postoperative pain, postoperative anemia, AKI, thrombocytopenia, hyperglycemia?as well as impaired mobility and activities  of daily living.?    The patient was admitted to [facility] on 08/22/2021. Prior level of function was independent with ADLs and functional transfers and independent mobility at the household level with device. Current level of function is total assist for toileting and total assist for lower body dressing.  Moderate assist x2 people for sit to supine bed mobility. Pt lives in a home with family.  His wife was seen at the bedside and reports being able to help after discharge. The patient was seen on admission to [facility] and endorsed lots of pain primarily in his right lower extremity after the surgery.  He reports that his pain medications have been changed multiple times and he has had trouble staying on top of it and feeling comfortable.  We discussed that we will leave the medicines as ordered currently but will be open to adjusting them as needed as he starts more intensive therapies.  He reports not wanting any stool softeners and that he typically has a bowel movement every 4 to 5 days.  He has been to rehab in the past with good success in the past.  His wife is seen at the bedside but he also has a son who is able to help after discharge. Pt denied chest pain, shortness of breath, or abdominal pain.    Past Medical History  No past medical history on file.    Past Surgical History  Surgical History:   Procedure Laterality Date   ? OPEN TREATMENT FEMORAL SHAFT FRACTURE WITH PLATE/ SCREWS WITH/ WITHOUT CERCLAGE Right 08/13/2021    Performed by Rayford Halsted, MD at [facility] OR       Family\Social History  Social History     Socioeconomic History   ? Marital status: Married   Tobacco Use   ? Smoking status: Never   ? Smokeless tobacco: Never   Substance and Sexual Activity   ? Alcohol use: Never   ? Drug use: Never             Family history reviewed; non-contributory    Medications: See MAR    PRN Medications: See MAR      Allergies:    Allergies   Allergen Reactions   ? Ambien [Zolpidem] AGITATION       Prior Level of Function:    Self-Care/ADLs:?Independent with ADLs and functional transfers  Mobility:?Independent Mobility at [employer] Level with Device  Work/Personal Responsibilities/Hobbies:??n/a    Home Environment  Home Environment:  Home Situation: Lives with Family (08/22/2021 12:00 PM)  Patient Owned Equipment: Nurse, adult; Manual Wheelchair (08/22/2021 12:00 PM)  Type of Home: House (08/22/2021 ?1:00 PM)  Entry Stairs: 1-2 Stairs (08/22/2021 12:00 PM)  In-Home Stairs: No Stairs (08/22/2021 12:00 PM)  Support System:??Son can provide physical assistance and consistent supervision    Current Level of Function:    Physical Therapy:?08-22-2021  Bed Mobility/Transfer  Bed Mobility: Supine to Sit: Minimal Assist;of 1st Person;Moderate Assist;of 2nd Person;Verbal Cues;Head of Bed Elevated;Use of Rail;Requires Extra Time;Assist with B LE;Assist with Trunk  Bed Mobility: Sit to Supine: Moderate Assist;x2 People;Verbal Cues;Bed Flat;No Rail;Requires Extra Time;Assist with B LE;Assist with Trunk  Comments: Knee immobilizer donned prior to therapy  Other Transfer Type: Horizontal  Other Transfer: Assistance Level: Bed;To/From;Bed Side Chair;Moderate Assist;x2 People  Other Transfer: Assistive Device: Sliding Board  Other Transfer: Type Of Assistance: Verbal Cues;To Maintain Precautions;For Strength Deficit;For Slide Board Placement;For Safety Considerations;Requires Extra Time  End Of Activity Status: In Bed;Instructed Patient to Request Assist  with Mobility;Instructed Patient to Use Call Light  Comments: Pt more agreeable to slideboard transfer today, but required consistent vc's for sequencing of movement, shoulder/hip instructions, ?and hand placement. OT helped gte patient back to bed after sitting up for 30 minutes  ?  Balance  Sitting Balance: Static Sitting Balance;1 UE Support;Minimal Assist;Moderate Assist  ?  Gait  Comments: Not appropriate at this time  ?  Occupational Therapy:?08-22-2021  Vision  Current Vision: Wears Glasses All of the Time  ?  ADL's  Eating Assist: Independent  LE Dressing Assist: Total Assist  LE Dressing Deficits: Don/Doff L Sock  Toileting Assist: Total Assist  Toileting Deficits: ?(external catheter)  ?  ADL Mobility  Bed Mobility: Sit to Supine: Moderate assist;of 1st person;Minimal assist;of 2nd person  Transfer Type: Sliding board  Transfer: Assistance Level: To/from;Bedside chair;Bed;Moderate assist;x2 people  Transfer: Assistive Device: Transfer/slide board  Transfer: Type of Assistance: For safety considerations;For strength deficit;For slide board placement;To maintain precautions;Verbal cues  End of Activity Status: In bed;Instructed patient to request assist with mobility;Instructed patient to use call light;Nursing notified (bed alarm activated)  Transfer Comments: Patient transfers back to bed using slideboard. Patient transfers towards L side. Adheres to weightbearing precautions with verbal cues. Demonstrates improvements to mobility  Sitting Balance: Static sitting balance;Minimal assist;Moderate assist  ?  Activity Tolerance  Endurance: 3/5 Tolerates 25-30 Minutes Exercise w/Multiple Rests  ?  Cognition  Overall Cognitive Status: WFL to Adequately Complete Self Care Tasks Safely  Expression: Increased Time for Expression  Social Interaction: Increased Time to Adjust;Withdrawn  Problem Solving: Cueing to Sequence Task;Direction Following Assist;Decreased Judgment/Safety  Memory: Unable to Provide Accurate Prior Level of Functioning History  Orientation: To Person  Attention: Awake/Alert  Cognition Comment: Patient with improved mental status. Able to carry intelligble conversation with therapists and follow and commands  ?  ROM  R UE ROM: WFL  ?R UE ROM Method: Active  L UE ROM: Not WFL  ?L UE ROM Method: Active  R LE ROM: WFL  L LE ROM: Not WFL  ROM Comments: reports pain has improved in L shoulder. Less gaurded. Per imaging, no concerns for injury  ?  ?  UE Strength / Tone  Strength Position Assessed: Seated  R UE Strength: WFL  L UE Strength: Not WFL (limited by pain)  R LE Strength: ?(not assessed due to restrictions)  L LE Strength: WFL  Strength Comments: L shoulder weakness due to pain  ?  Speech Therapy:?08-22-2021  Patient seen?1x?this date. ?Documentation reflects all daily treatment sessions.  ?  SUMMARY OF THERAPY SESSION:?  Pt seen this date for ongoing cognitive communication therapy.   Wife at bedside, notes at the current time pt's mental status at baseline, however she has noted some mild confusion during some nights.   Improved orientation, attention, and basic problem solving noted this date.   RN?  ?  RECOMMENDATIONS:  -SLP will follow for ongoing assessment and monitoring of cognitive skills at 1-2x/wk while admitted.?  -Ongoing SLP at next level of care?targeting cognitive communication skills?  -?Consistent supervision recommended upon discharge as anticipate patient's impaired safety awareness and/or problem solving will impact their ability to call for help.  -?Consistent supervision recommended upon discharge as anticipate patient's impaired memory &?attention will potentially impact their safety.  -Recommend assist w/ finance &?medication management and meal preparation upon discharge secondary to patient's impaired attention and/or memory.??    Review of Systems:  All other systems reviewed and are negative.    Physical  Exam:     BP: 126/65 (05/30 1208)  Temp: 36.5 ?C (97.7 ?F) (05/30 1208)  Pulse: 81 (05/30 1208)  Respirations: 18 PER MINUTE (05/30 1208)  SpO2: 99 % (05/30 1208)  O2 Device: None (Room air) (05/30 1208)    There is no height or weight on file to calculate BMI.    Gen: Alert & Oriented X 3, No Acute Distress  HEENT: NCAT, EOMI, MMM  Neck: Supple, no elevated JVP  Heart: Regular Rate & Rhythm, no m/g/r  Lungs: Clear to auscultation bilaterally, no w/r/r  Abdomen: Soft, non-tender, non-distended, +BS  GU: Right nephrostomy tube in place, draining straw-colored yellow urine  Skin: No obvious lesions on exposed skin  Ext: No pedal edema bilaterally, R hip incision w/ dressing that is C/D/I, RLE immobilizer in place  MS:   Root Right Left   Shoulder Abduction C5 5 5   Elbow Flexion C5 5 5   Elbow Extension C7 5 5   Wrist Extension C6 5 5   Finger Flexion C8 5 5   Finger Abduction T1 5 5   Hip Flexion L2 - 4   Knee Flexion L5/S1 - 5   Knee Extension L3 - 5   Dorsiflexion L4 5 5   Plantarflexion S1 5 5   EHL Extension L5 5 5     Neuro:  Cranial Nerves Cranial Nerves 2-12 are grossly intact   Finger to Nose Normal   Upper Extremity Tone Normal   Lower Extremity Tone Normal   Upper Extremity Sensation Intact to light touch bilaterally   Lower Extremity Sensation Intact to light touch bilaterally   Clonus Negative Bilaterally   Proprioception Intact Bilaterally   Memory/Cognition/Speech Appears to be cognitively intact, appropriate speech and processing speed with casual conversation     Basic Metabolic Profile    Lab Results   Component Value Date/Time    NA 141 08/17/2021 04:48 PM    K 4.2 08/17/2021 04:48 PM    CA 9.5 08/17/2021 04:48 PM    CL 105 08/17/2021 04:48 PM    CO2 27 08/17/2021 04:48 PM    Lab Results   Component Value Date/Time    BUN 19 08/17/2021 04:48 PM    CR 1.07 08/17/2021 04:48 PM    GLU 151 (H) 08/17/2021 04:48 PM      CBC w diff    Lab Results   Component Value Date/Time    WBC 6.1 08/17/2021 04:48 PM    RBC 2.88 (L) 08/17/2021 04:48 PM    HGB 8.2 (L) 08/17/2021 04:48 PM    HCT 24.7 (L) 08/17/2021 04:48 PM    MCV 85.9 08/17/2021 04:48 PM    MCH 28.7 08/17/2021 04:48 PM    RDW 17.3 (H) 08/17/2021 04:48 PM    PLTCT 182 08/17/2021 04:48 PM    MPV 9.2 08/17/2021 04:48 PM    Lab Results   Component Value Date/Time    NEUT 80 (H) 08/15/2021 09:10 AM    ANC 8.62 (H) 08/15/2021 09:10 AM    LYMA 11 (L) 08/15/2021 09:10 AM    ALC 1.15 08/15/2021 09:10 AM    MONA 9 08/15/2021 09:10 AM    AMC 0.98 (H) 08/15/2021 09:10 AM    EOSA 0 08/15/2021 09:10 AM    AEC 0.04 08/15/2021 09:10 AM    BASA 0 08/15/2021 09:10 AM    ABC 0.02 08/15/2021 09:10 AM        Radiology:  Pertinent Imaging Reviewed  Francesco Sor, DO

## 2021-08-22 NOTE — Progress Notes
RT Adult Assessment Note    NAME:Jimmy Waters             MRN: [id number]             DOB:[birth year]          AGE: 66 y.o.  ADMISSION DATE: 08/22/2021             DAYS ADMITTED: LOS: 0 days    RT Treatment Plan:            Additional Comments:  Impressions of the patient: patient alert and oriented; NAD  Intervention(s)/outcome(s): pt does not meet RT criteria at this time  Patient education that was completed: none  Recommendations to the care team: none    Vital Signs:  Pulse: 75  RR: 18 PER MINUTE  SpO2: 98 %  O2 Device: None (Room air)  Liter Flow:    O2%:    Breath Sounds:  WDL  Respiratory Effort:  WDL

## 2021-08-22 NOTE — Rehab Pre-Admission Screening
Physical Medicine & Rehabilitation Pre-Admission Screening    Jimmy Waters is a 66 y.o.  male.     DOB: [birth year]                  MRN#:  [id number]  Primary Insurance: MEDICARE  Secondary Insurance: Monia Pouch  Financial Class: Medicare  Date of Hospital Admission:  08-12-2021  Date of Expected Rehab Admission:  08-22-2021  Precautions:  Fall, isolation  Weight bearing Precautions:  RLE NWB, RLE knee immobilizer    Medical Course    Hospital Course:       This is a 66 year old male with a past medical history of  significant for hypertension, diabetes, thyroid cancer, BPH with urinary retention, status post iatrogenic ureter injury and urosepsis bacteremia. Patient was admitted to [facility] on 08/12/2021 after a ground-level fall at home, status post right lower extremity periprosthetic fracture, status post ORIF on 5/21.?ID was consulted 5/21 given concern for R kidney infection on CT and bacteruria w/ indwelling nephrostomy tube. He was started on Meropenem and completed this 5/28. Acute hospitalization has been complicated by cognitive impairment, postoperative pain, postoperative anemia, AKI, thrombocytopenia, hyperglycemia as well as impaired mobility and activities of daily living.   ?  Patient has been working with physical and occupational therapy since 08-14-2021 and has been making functional gains. Patient is anticipated to return to home at the stand by assistance level of care. Patient will also receive formal cognitive evaluation to assess for baseline status and any new cognitive/linguistic deficits.   ?        Prior Level of Function         Self-Care/ADLs: Independent with ADLs and functional transfers  Mobility: Independent Mobility at Household Level with Device  Work/Personal Responsibilities/Hobbies:  n/a  ?  Home Environment:  Home Situation: Lives with Family (08/22/2021 12:00 PM)  Patient Owned Equipment: Nurse, adult; Manual Wheelchair (08/22/2021 12:00 PM)  Type of Home: House (08/22/2021  1:00 PM)  Entry Stairs: 1-2 Stairs (08/22/2021 12:00 PM)  In-Home Stairs: No Stairs (08/22/2021 12:00 PM)      Support System:  Son can provide physical assistance and consistent supervision         Current Level of Function    Physical Therapy: 08-22-2021  Bed Mobility/Transfer  Bed Mobility: Supine to Sit: Minimal Assist;of 1st Person;Moderate Assist;of 2nd Person;Verbal Cues;Head of Bed Elevated;Use of Rail;Requires Extra Time;Assist with B LE;Assist with Trunk  Bed Mobility: Sit to Supine: Moderate Assist;x2 People;Verbal Cues;Bed Flat;No Rail;Requires Extra Time;Assist with B LE;Assist with Trunk  Comments: Knee immobilizer donned prior to therapy  Other Transfer Type: Horizontal  Other Transfer: Assistance Level: Bed;To/From;Bed Side Chair;Moderate Assist;x2 People  Other Transfer: Assistive Device: Sliding Board  Other Transfer: Type Of Assistance: Verbal Cues;To Maintain Precautions;For Strength Deficit;For Slide Board Placement;For Safety Considerations;Requires Extra Time  End Of Activity Status: In Bed;Instructed Patient to Request Assist with Mobility;Instructed Patient to Use Call Light  Comments: Pt more agreeable to slideboard transfer today, but required consistent vc's for sequencing of movement, shoulder/hip instructions, ?and hand placement. OT helped gte patient back to bed after sitting up for 30 minutes  ?  Balance  Sitting Balance: Static Sitting Balance;1 UE Support;Minimal Assist;Moderate Assist  ?  Gait  Comments: Not appropriate at this time  ?  Occupational Therapy: 08-22-2021  Vision  Current Vision: Wears Glasses All of the Time  ?  ADL's  Eating Assist: Independent  LE Dressing Assist: Total Assist  LE  Dressing Deficits: Don/Doff L Sock  Toileting Assist: Total Assist  Toileting Deficits: ?(external catheter)  ?  ADL Mobility  Bed Mobility: Sit to Supine: Moderate assist;of 1st person;Minimal assist;of 2nd person  Transfer Type: Sliding board  Transfer: Assistance Level: To/from;Bedside chair;Bed;Moderate assist;x2 people  Transfer: Assistive Device: Transfer/slide board  Transfer: Type of Assistance: For safety considerations;For strength deficit;For slide board placement;To maintain precautions;Verbal cues  End of Activity Status: In bed;Instructed patient to request assist with mobility;Instructed patient to use call light;Nursing notified (bed alarm activated)  Transfer Comments: Patient transfers back to bed using slideboard. Patient transfers towards L side. Adheres to weightbearing precautions with verbal cues. Demonstrates improvements to mobility  Sitting Balance: Static sitting balance;Minimal assist;Moderate assist  ?  Activity Tolerance  Endurance: 3/5 Tolerates 25-30 Minutes Exercise w/Multiple Rests  ?  Cognition  Overall Cognitive Status: WFL to Adequately Complete Self Care Tasks Safely  Expression: Increased Time for Expression  Social Interaction: Increased Time to Adjust;Withdrawn  Problem Solving: Cueing to Sequence Task;Direction Following Assist;Decreased Judgment/Safety  Memory: Unable to Provide Accurate Prior Level of Functioning History  Orientation: To Person  Attention: Awake/Alert  Cognition Comment: Patient with improved mental status. Able to carry intelligble conversation with therapists and follow and commands  ?  ROM  R UE ROM: WFL  ?R UE ROM Method: Active  L UE ROM: Not WFL  ?L UE ROM Method: Active  R LE ROM: WFL  L LE ROM: Not WFL  ROM Comments: reports pain has improved in L shoulder. Less gaurded. Per imaging, no concerns for injury  ?  ?  UE Strength / Tone  Strength Position Assessed: Seated  R UE Strength: WFL  L UE Strength: Not WFL (limited by pain)  R LE Strength: ?(not assessed due to restrictions)  L LE Strength: WFL  Strength Comments: L shoulder weakness due to pain  ?  Speech Therapy: 08-22-2021  Patient seen?1x?this date. ?Documentation reflects all daily treatment sessions.  ?  SUMMARY OF THERAPY SESSION:?  Pt seen this date for ongoing cognitive communication therapy.   Wife at bedside, notes at the current time pt's mental status at baseline, however she has noted some mild confusion during some nights.   Improved orientation, attention, and basic problem solving noted this date.   RN?  ?  RECOMMENDATIONS:  -SLP will follow for ongoing assessment and monitoring of cognitive skills at 1-2x/wk while admitted.?  -Ongoing SLP at next level of care?targeting cognitive communication skills?  -?Consistent supervision recommended upon discharge as anticipate patient's impaired safety awareness and/or problem solving will impact their ability to call for help.  -?Consistent supervision recommended upon discharge as anticipate patient's impaired memory &?attention will potentially impact their safety.  -Recommend assist w/ finance &?medication management and meal preparation upon discharge secondary to patient's impaired attention and/or memory.?       Rehabilitation Plan    Rehab Diagnosis and conditions that require Rehabilitation:      fracture - femur (Shaft)   Cognitive impairment, Pain and Weakness       Active Comorbidities/Risk of Medical Complications:      1. Femur fx: Patient was admitted to [facility] on 08/12/2021 after a ground-level fall at home, status post right lower extremity periprosthetic fracture, status post ORIF on 5/21  2. Cognitive Impairment: Patient with cognitive impairment.  May need 24 hour supervision upon discharge home.  3. Depression: Will need to monitor and manage, as this may negatively impact patient's participation in  therapy.  Consult Neuropsychology.  4. Diabetes: Patient is at risk for hyper/hypoglycemia during aggressive mobilization with therapies, and will need daily physician monitoring and adjustment of medications.  Patient may benefit from ongoing education from diabetes nurse educator for healthy lifestyle modifications.  5. Falls:  Patient will be placed close to the nurse's station for closer monitoring. Staff will round on the patient frequently to assess for any needs, such as using the restroom to help prevent falls as this increases the patient's risk for morbidity/mortality and 30 day readmission to the hospital rate.   6. Hyperlipidemia: Will continue to monitor and manage, and adjust medications as needed.  7. Hypothyroidism: Will continue to monitor and manage, and adjust medications as needed.  8. Morbid Obesity: Patient's BMI is 32.26 which place them at a higher risk of medical complications including DVTs and decubitus ulcers as well as poor post-operative mobility and performance.  Consult Dietician on recommendations for healthy diet.  9. Urinary Tract Infection: Patient is at risk for worsening UTI, SIRS, and sepsis.  Continue antibiotic treatment and repeat UA/culture as needed.  10. Risk for Thrombosis: Patient has Heparin, sequential compression devices ordered.  Patient will be encouraged to ambulate frequently with the assistance of health care provider.  11. Weight Bearing Limitation: Patient is currently in NWB RLE; RLE knee immobilizer. Patient will work with PT to help improve strength, balance and endurance while safely maintaining that weight bearing status  in order to prevent further injury.                Patient will receive Physical therapy, Occupational therapy and Speech therapy each 60 minutes a day, 5 days a week for a total of 3 hours daily (minimum) for the duration of the rehabilitation stay within an interdisciplinary rehabilitation program with case manager/social worker, dietician, neuropsychologist, rehab nursing and PM&R oversight.  ?  Rehabilitation Prognosis: Fair to good  Medical Prognosis: The patient is deemed medically stable to tolerate, benefit from, and participate in IRF level services.  Tolerance for three hours of therapy a day: Good. Patient has participated well with therapies since 08-14-2021 in the acute care setting and is anticipated to tolerate therapy as required.   ?  Goals/Barriers/Facilitators  Family / Patient Goals: return home with family assistance  Mobility Goals: Overall goal is Supervision or touching assistance and Physical Therapy will evaluate and treat ambulation/wheelchair use and bed transfers  Activities of Daily Living (ADLs) Goals: Overall goal is Supervision or touching assistance and Occupational therapy will evaluate and treat basic Activities of Daily Living  Cognition / Communication Goals: Overall goal is Supervision or touching assistance and Speech therapy will evaluate and treat cognition and communication deficits and assess for safe swallow          Barriers & Interventions:   Caregiver Apprehension: Arrange caregiver support and discuss barriers and patient progress with caregivers when appropriate.  High Burden of Care: Initiate interdisciplinary rehabilitation to improve functional independence and reduce burden of care.  Home Accessibility: Work with family to obtain home measurements of doorways, bathroom access, and stair set-up to plan for appropriate adaptive equipment and home modifications as necessary.  Medication Education:  Pharmacist and nursing staff to provide education to patient and family regarding medication side effects, special precautions, and safe administration.  Facilitators: good family / social support, patient motivation, improving strength / endurance and improving medical condition  ?  Discharge Planning  Expected Length of Stay 14 day(s)  Expected Discharge Disposition  Home  ?          Romie Minus, BSN, RN

## 2021-08-23 VITALS — BP 138/76 | HR 79 | Temp 97.50000°F

## 2021-08-23 VITALS — BP 139/70 | HR 76 | Temp 97.70000°F

## 2021-08-23 VITALS — BP 142/67 | HR 78 | Temp 97.80000°F

## 2021-08-23 VITALS — BP 121/70 | HR 69 | Temp 97.80000°F

## 2021-08-23 VITALS — BP 135/70 | HR 76 | Temp 98.00000°F

## 2021-08-23 VITALS — BP 154/80 | HR 76 | Temp 97.90000°F

## 2021-08-23 MED ORDER — PHENAZOPYRIDINE 100 MG PO TAB
200 mg | Freq: Three times a day (TID) | ORAL | 0 refills | Status: DC
Start: 2021-08-23 — End: 2021-08-23

## 2021-08-23 MED ORDER — PHENAZOPYRIDINE 100 MG PO TAB
200 mg | Freq: Three times a day (TID) | ORAL | 0 refills | Status: CP
Start: 2021-08-23 — End: ?
  Administered 2021-08-23 – 2021-08-26 (×9): 200 mg via ORAL

## 2021-08-23 NOTE — Progress Notes
OCCUPATIONAL THERAPY  NOTE   Name: Jimmy Waters   MRN: [id number]     DOB: 04-15-1955      Age: 66 y.o.  Admission Date: 08/22/2021     LOS: 1 day     Date of Service: 08/23/2021         08/23/21 1018   Time Calculation   Start Time 1018   Stop Time 1148   Time Calculation 90   $$ OT Eval - High Complexity 1 Procedure   $$ Professional Contact 2 units   History   Reason For Admission type II trauma after fall at home resulting in R periprosthetic femur fx who presents now for inpatient rehabilitation s/p OR 5/21 w/ Ortho for ORIF of his hip with a course notable for pain, encephalopathy,completion of anti-biotics for UTI vs kidney infection on 5/28 (meropenem)   Previous Medical History past medical history of HTN, HLD, DMII, thyroid carcinoma (surgery 07/27/21 per ENT), nephrostomy tubes   Subjective   Subjective Pt supine in bed upon OT arrival/exit with alarm set and needs met   Precautions   R LE Precautions RLE non-weight bearing;RLE knee immobilizer   Prior Function   Prior Functioning: Self Care 3   Prior Functioning: Functional Cognition 3   Self Care Comments Pt reported being independent with ADLs prior to admission. Reported occasional use of RW for community mobility. Reported multiple falls at home prior to admission   Vocational Retired  Printmaker)   Home Environment   Home Situation Lives with Family  (Spouse)   Type of Home House   Home Layout Able to Live on Main Level w/Bedrm/Bathrm Access;Stairs to Enter w/ Economist / Tub Walk-in Shower   Comment Pt reported walking in shower with shower chair and grab bars inside shower.   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 Pt unable to complete full internal rotation.   Grooming   Grooming - Wash Face Assist No   Grooming - Wash Both Hands Assist No   Grooming Assist Stand By Assist   Grooming Comments Pt seated in wheelchair washing face and hands with set-up assist.   Bathing   Bath/Shower Partial;Soap and water shower/bath   Bathing - Chest Assist No   Bathing - Left Arm Assist No   Bathing - Right Arm Assist No   Bathing - Abdomen Assist No   Bathing - Perineal Area   (declined to complete)   Bathing - Buttocks   (declined to complete)   Bathing - Left Upper Leg   (Declined to complete)   Bathing - Right Upper Leg Other (Comment)  (cast/immobilizer form thigh down)   Bathing - Left Lower Leg Including Foot   (declined)   Bathing - Right Lower Leg Including Foot   (covered)   Bathing Assist Sponge Bath;Stand By Assist   Bathing Position Other (Comments)  (seated in wheelchair)   Bathing Comments Pt declined completing full shower this date. Pt agreeable to complete sponge bath seated in wheelchair. Pt washing from waist up, pt declined bathing from waist down. Required set-up assist.   Upper Body Dressing   UE Dressing Assist Stand By Assist   Upper Dressing Position Sitting in chair   Upper Dressing Comments Set-up assist   Lower Body Dressing   LE Dressing Assist Total Assist   Lower Dressing Position HOB up ___ degrees   Lower Dressing Comments Required assist to thread LEs into  shorts. Pt rolling L/R in bed, required assist to don shorts over hips.   Toileting   Toileting Comments lack of urgency, unable to formally assess   Nutritional therapist Comments lack of urgency, unable to formally assess   Tub/Shower Transfer   Tub/Shower Comments pt declined transfer to/from shower chair this date   Transfers   Transfer: Assistive Device Sliding Board;Standard Walker   Transfer: Sit to stand assist level Moderate assistance;With two person   Transfer: Sit to stand type of assist Facilitation of trunk;Facilitation of weight shift;Requires cues to follow precautions;Requires verbal cues for sequencing;Increased time to complete;For safety   Transfer: Stand to sit assist level Moderate assistance;With one person;Contact guard assistance;With two person   Transfer: Stand to sit type of assist Increased time to complete;Requires verbal cues for sequencing;For safety;Facilitation of trunk   Transfer: Stand pivot assist level Moderate assistance;With one person;Minimum assistance;With two person   Transfer: Stand pivot type of assist Increased time to complete;For safety;Facilitation of trunk;Assist with Right lower extremity;Requires verbal cues for sequencing;Requires cues to follow precautions   Transfer Comment Pt seated EOB, required contact guard to MIN assist for sitting balance. Pt attempted stand pivot transfer from bed to wheelchair with RW. Pt attempted to stand x3, pt declined stand pivot transfer. Pt completed slide board transfer from bed to wheelchair with MOD assist of 1 and MIN assist of  second. Pt completed MOD assist x2 to transition from sitting to standing, complete stand pivot transfer from wheelchair to bed with RW with MOD assist x1 and MIN assist of second. Pt required cues to maintain RLE NWB precautions   Vision   Current Vision Wears Glasses All of the Time   Communication/Cognition   Expression Speech Off Task   Problem Solving Cueing to Sequence Task   Attention Awake/Alert   Cognition Comment Pt hyper verbal and tangential throughout session. Required MAX verbal cues to redirect to task. Pt demonstrated increased anxiety with prior to all mobility   Assessment   Assessment Pt currently limited by anxiety, decreased activity tolerance, pain, RLE NWB precautions, nausea, impaired balance, and generalized weakness. Pt would benefit from inpatient occupational therapy services to address stated concerns and maximize independence with ADLs and IADLs   Plan   OT Plan Balance training;Cognitive training;Community reintegration;Family / caregiver training;Functional transfers training;Home-management retraining;Postural control management;Self-care retraining;Therapeutic exercises;UE / trunk management   Plan Comments bathing, commode transfer (slideboard vs stand pivot), LB dressing with reacher, and sit/stands   Recommendations   OT Discharge Recommendations Home with intermittent supervision;Home with Home Health;Vs;Home setting w/Outpatient Therapy   OT Equipment Recommendations Too early to determine   Education   Persons Educated Patient   Teaching Methods Verbal Instruction   Patient Response Verbalized Understanding   Topics Role of OT, Goals for Therapy   Goal Formulation With Patient   Weekly Goals   Patient Will Perform LE Dressing w/ Moderate Assist   Patient Will Perform Grooming w/ Stand By Assist   Patient Will Perform Toileting w/ Moderate Assist   Pt Will Transfer To Bedside Commode w/ Moderate Assist;Maintaining Weight Bearing Status   Goals for the stay   Pt will perform basic care and transfer with Minimum assistance  (with least restrictive device)   Type of Note   Type of Note Evaluation       Therapist: Ulyses Amor, OTR/L [id number]  Date: 08/23/2021

## 2021-08-23 NOTE — Progress Notes
PHYSICAL THERAPY       08/23/21 1430   Type of Note   Type of Note Evaluation   Time Calculation   Start Time 1430   Stop Time 1530   Time Calculation 60   $$ Pt Eval -High Complexity 1 Procedure   $$ Professional Contact 1 unit    $$ PT Therapeutic Activity 1 unit   History   Reason For Admission type II trauma after fall at home resulting in R periprosthetic femur fx who presents now for inpatient rehabilitation s/p OR 5/21 w/ Ortho for ORIF of his hip with a course notable for pain, encephalopathy,completion of anti-biotics for UTI vs kidney infection on 5/28 (meropenem)   Previous Medical History past medical history of HTN, HLD, DMII, thyroid carcinoma (surgery 07/27/21 per ENT), nephrostomy tubes   Subjective   Subjective Patient in bed at onset of session.  Agreeable to session.  Patient receiving pain meds shortly after session started.   Precautions   R LE Precautions RLE non-weight bearing;RLE knee immobilizer   Cognitive   Patient Behavior Cooperative  (particular about how assistance provided)   Cognition Follows Commands   Home Environment   Home Situation Lives with Family  (spouse)   Type of Home House   Patient Futures trader walker;Manual wheelchair   Home Layout Able to Live on Main Level w/Bedrm/Bathrm Access;Stairs to Enter w/ [employer] Details 2 with railings on both sides   In Home Stairs Details no stairs   Range of Motion   ROM Comments RLE in knee immobilizer to be worn at all times   Strength   L LE Strength WFL   Strength Comments RLE 2+/5 hip flexion, 2+/5 abduction, 2+/5 adduction.  Able to complete full motion L ankle plantarflexion and dorsiflexion   Sensation/Tone/Coordination   Posture Forward head;Rounded shoulders;Kyphosis;Posterior pelvic tilt   Posture/Neuro Comments Patient reports no changes in sensation.   Bed Mobility   Bed Mobility: Supine to sit assist level Moderate assistance;Minimum assistance;Assist of two  (Patient reaching out for bed side table to assist getting up)   Bed Mobility: Supine to sit type of assist Assist with trunk;With head of bed flat;No rail   Bed Mobility: Sit to Supine Assist Level Minimum assistance   Bed Mobility: Sit to supine type of assist Assist with right lower extremity   Transfers   Transfer: Assistive Device Sliding Board   Transfer: Sliding board assist level Minimum assistance;With two person   Transfer: Sliding board type of assist Facilitation of trunk;Requires cues for positioning of device;Follows precautions;Facilitation of weight shift   Transfer Comment Trialled standing with walker.  Patient expressing concern from the wheelchair and concerned about being able to maintain weight bearing precautions.  Discussed that PT will plan for other means to attempt standing that patient is more comfortable with   Gait   Gait Comment Not safe to attempt due to weight bearing precautions   Stairs   Stair: Assist Level Not safe to attempt   Wheelchair   Wheelchair: Distance 75 feet  (x2 rest break required during 2nd bout of propulsion due to reporting fatigue in UE)   Wheelchair: Assistance Level Minimum assistance   Wheelchair: Propulsion Method Bilateral upper extremity;Manual wheelchair   Wheelchair Comments PT assisting with wheelchair brakes and leg rests   Outcome Measures   10 Minute Walk:  Not Applicable NA   Berg Balance Scale: Not Applicable NA   Functional Gait Assessment: Not Applicable NA  5x Sit to Stand: Not Applicable NA   Assessment   Assessment Patient presents with decreased independence with all upright moblility following a fall and R periprosthetic hip fracture.  Patient now non-weight bearing on RLE s/p ORIF.  Patient's decreased independence resulting from decreased strength in RLE, pain, anxiety regarding ability to progress and maintain weight bearing restrictions.  Patient is appropriate for skilled inpatient physical therapy to progress to independent level of function.   Recommendations   PT Discharge Recommendations Home with Assistance;Home Health Setting   Equipment Recommendations Sliding board   Comments patient reports he has a wheelchair at home   Weekly Goals   Patient will perform sit to supine with Stand by assistance   Patient will perform supine to sit with Minimum assistance   Patient will complete sit to stand transfer with Moderate assistance   Patient will complete stand to sit transfer with Minimum assistance   Patient will complete stand pivot transfer with Minimum assistance   Patient will complete sliding board transfer with Minimum assistance   Patient will ascend/descend 2 stairs;2 rail   Patient will propel manual wheelchair up and down ramp with Independence   Patient will propel manual wheelchair on level surfaces with Independence   Goal(s) for the Stay   Patient will perform household mobility;community mobility;at wheelchair level;Independent     Raynelle Highland, DPT, NCS  Doctor of Physical Therapy, Board Certified Specialist in Neurologic Physical Therapy

## 2021-08-23 NOTE — Progress Notes
SPEECH-LANGUAGE PATHOLOGY  COGNITIVE ASSESSMENT     Overall Cognitive Severity Level: Severe  A cognitive evaluation was administered utilizing the Repeatable Battery for the Assessment of Neuropsychological Status, Form A (RBANS-A) Pt received a total scale score of 58 which is classified as extremely low when compared to same-aged individuals. Relative strengths demonstrated for visuospatial/constructional and attention while areas of difficulty noted for immediate memory and delayed memory. At [facility] [place], pt lived with his wife. He was independent for all ADLs and completed tasks including some cleaning tasks (laundry and dishes), medication administration (wife sets up pill box), and he cares for his dog. Pt reports his wife completes all finances. Current level of functioning is judged to be a change from cognitive baseline. No family present at time of evaluation. Please see further details below.    Prognosis: Good  Plan: Continue Treatment Daily  Results Reported to Physician: Yes -- via EMR    CURRENT RECOMMENDATIONS:  1. SLP will continue to follow for ongoing assessment and monitoring of cognitive skills during admission (60 min/day)  2. At this time, anticipate pt will benefit from ongoing ST services at the next level of care   3. Intermittent supervision recommended upon discharge as anticipate patient's impaired problem solving (or communication) will impact their ability to call for help  4. Intermittent supervision recommended upon discharge as anticipate patient's impaired memory & attention will potentially impact their safety  5. Anticipate pt may continue to require assistance with medication/finance management and meal preparation upon discharge 2/2 pt's impaired attention and memory    RBANS - Repeatable Battery for the Assessment of Neuropsychological Status, Form A, completed 08/23/21  Domain Subtest Total Score Index Score Classification Notes   Immediate Memory List Learning 16/40 53 Extremely Low     Story Memory 6/24      Visuospatial/  constructional Figure Copy 20/20 96 Average     Line Orientation 20/20      Language Picture Naming 10/10 54 Extremely Low     Semantic Fluency 6/40      Attention Digit Span 8/16 68 Extremely Low     Coding 18/89      Delayed Memory List Recall 1/10 64 Extremely Low     List Recognition 17/20       Story Recall 3/12       Figure Recall 8/20      Sum of Index Scores   335     TOTAL SCALE   58 Extremely Low 3rd  percentile       130 and above = Very Superior  120-129 = Superior  110-119 = High Average  90-109 = Average  80-89 = Low Average  70-79 = Borderline  69 and below = Extremely Low    PRAGMATICS  Comments: Appropriate eye contact and affect for the situation.    BEHAVIOR  Comments: Pt alert and cooperative, agreeable to evaluation. Increased processing time and need for repetition of information noted. No impulsive behaviors observed. Pt reports cognitive communication skills have not changed since PLOF, stating I have had these problems my whole life. Pt stated I was looney. Feels as though brain fog has resolved. Pt observed as tangential intermittently throughout evaluation.    ORIENTATION  Comments: Pt alert and oriented to all current concepts. Pt scored a 30/30 on the O-Log. Pt was correctly oriented to person, place, time, etiology, and deficits. Pt demonstrates good insight/awareness into current physical situation. Demonstrates poor insight/awareness into cognitive communication deficits.  LANGUAGE  Comments: No focal language deficits noted. Auditory comprehension appeared functional for participation in simple conversation and to follow one and two step commands. No significant word finding difficulties noted in structured conversation.     ST GOALS FOR STAY:  See Rehab SLP Goals docflowsheet for information regarding goals.    PLAN FOR NEXT VISIT:  -Start Memory Book  -Establish meaningful tasks (medication administration, caring for wounds, home safety utilizing any new DME, etc.) to target cognitive communication deficits   -Administer cog-log    Objective:   This is a 66 year old male?with a past medical history of??significant for hypertension, diabetes, thyroid cancer, BPH with urinary retention, status post iatrogenic ureter injury and urosepsis bacteremia.?Patient?was?admitted [facility] 08[facility]12[facility]2021 after a ground-level fall at home, status post right lower extremity periprosthetic fracture, status post ORIF on 5/21.?ID was consulted 5/21 given concern for R kidney infection on CT and bacteruria w/ indwelling nephrostomy tube. He was started on Meropenem and completed this 5/28.?Acute hospitalization has been complicated by?cognitive impairment,?postoperative pain, postoperative anemia, AKI, thrombocytopenia, hyperglycemia?as well as impaired mobility and activities of daily living.?    CT HEAD 08/12/21:  1. No acute intracranial hemorrhage or calvarial fracture.   2. Patchy supratentorial white matter hypodensities, which are   nonspecific, but favored to represent chronic small vessel ischemic   change.     Handedness: Right  Hearing: 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   Psychosocial Status: Willing and Cooperative to Participate  Persons Present: None    Subjective  Pain: Patient has no complaint of pain  Trach Presence: No  Feeding Tube Present During Eval: None    Education*  Persons Educated: Patient  Barriers To Learning: Cognitive Deficits  Interventions: Staff Educated  Teaching Methods: Verbal  Topics: Memory  Patient Response: Verbalized Understanding  Goal Formulation: With Patient  Comments: Educated pt on results of evaluation and recommended skilled SLP services for 60 min/day targeting cognitive communication skills to encourage independence/PLOF at discharge.      Therapist: Ninetta Lights, MA,CCC-SLP  Date: 08/23/2021

## 2021-08-24 VITALS — BP 126/72 | HR 76 | Temp 97.50000°F

## 2021-08-24 VITALS — BP 144/73 | HR 76 | Temp 97.90000°F

## 2021-08-24 VITALS — BP 140/69 | HR 70 | Temp 98.20000°F

## 2021-08-24 MED ORDER — LIDOCAINE 5 % TP PTMD
1 | Freq: Every day | TOPICAL | 0 refills | Status: DC
Start: 2021-08-24 — End: 2021-08-30
  Administered 2021-08-24 – 2021-08-29 (×6): 1 via TOPICAL

## 2021-08-24 NOTE — Progress Notes
PHYSICAL THERAPY       08/24/21 1000   Type of Note   Type of Note Daily   Time Calculation   Start Time 1000   Stop Time 1100   Time Calculation 60   $$ Professional Contact 1 unit   (huddle)   $$ PT Therapeutic Activity 3 units   $$ Therapeutic Exercise  1 unit    Subjective   Subjective Patient in bed at onset of session.  Agreeable to therapy.   Precautions   R LE Precautions RLE non-weight bearing;RLE knee immobilizer   Cognitive   Patient Behavior Cooperative;Anxious   Cognition Follows Commands   Bed Mobility   Bed Mobility: Scooting Assist of 2   Bed Mobility: Supine to sit assist level Moderate assistance   Bed Mobility: Sit to Supine Assist Level Assist of two   Bed Mobility: Sit to supine type of assist Assist with trunk;Assist with bilateral lower extremity   Bed Mobility Comments Patient reporting quick need to return to supine due to nausea.  PT and tech assisting patient to supine and using draw sheet to reposition.   Transfers   Transfer: Press photographer Comment Patient initiating slide board transfer to wheelchair.  PT assisting to place slide board.   Patient's hip sliding forward and PT needing to block LLE.  Patient having difficulty problem solving through hand placement and movement this date.  Patient completing about 1/2 distance and reporting need to return to supine due to nausea. Patient assisted back to bed.  Nursing provided patient with anti-nausea med upon return   Activity   PT Therapeutic Activities Patient reporting feeling like right heel was getting a pressure sore.  Pulled ACE wrap and padding to the side to inspect.  Some redness noted and tender to touch.  Nursing notified and came in to assess.  Prevalon boot placed back on R foot at the end of the session.  Discussed need to complete slide boards if patient is unable to stand.  Patient called son and requested measurement of the bed height.  Discussed that if entry can't be ramped that someone would need to assist bumping patient in the wheelchair up the two steps.  Requested pictures of the entrance to the home.  straight leg raise x 10 reps BLE, hip ab/adduction x 10 reps.  Active assist on right to decrease friction of movement.   Assessment   Assessment Patient more limited in ability to complete upright mobility this date due to onset of nausea.   Recommendations   PT Discharge Recommendations Home with Assistance;Home Health Setting   Equipment Recommendations Sliding board   Comments patient reports he has a wheelchair at home     Raynelle Highland, DPT, NCS  Doctor of Physical Therapy, Board Certified Specialist in Neurologic Physical Therapy

## 2021-08-24 NOTE — Case Management (ED)
Case Management Admission Assessment    NAME:Jimmy Waters                          MRN: [id number]             DOB:04/11/1955          AGE: 66 y.o.  ADMISSION DATE: 08/22/2021             DAYS ADMITTED: LOS: 2 days      Today?s Date: 08/24/2021    Source of Information: Patient and EMR    Pt lives in ranch style home in [place] with 2, 6 inch steps to enter with 2 rails at back.  Pt has walk in shower with built in bench, hhsh and grab bars.    Pt's wife, Sharl Ma is retired and pt reports is in good health to provide assist at [employer]. Pt reports his son is building a home, therefore staying with them. He also reports he has a dgt who is an OT at [facility] and a dgt who works in Teacher, music at [employer].    Pt reports he was independent PTA with occassional use of rw.  He owns rw, wc and crutches.    Pt has hx of [facility], [facility] and was set up with [facility], but did not see them due to hospitalization. He would like [facility] at [place] if hh is needed.    Pt has been to [facility], but would not return as therapist/pt ratio is 4-1.    Pt's PCP is [facility] and pt has Occupational hygienist.  Pt has [employer] RX coverage and is currently in the donut hole, therefore his medications can be expensive until out of donut hole. Pt fills at [facility] in [place].    Pt reports concern of staff helping him maintain his NWB precautions and wished for SW to discuss with staff at huddle. SW expressed pt's concerns with team. Team plans to address goals for rehab with pt and ensuring weight bearing precautions.    Plan  Plan: Case Management Assessment, Assist PRN with SW/NCM Services    Patient Address/Phone  [address]  [place] [address]  [phone] (home)     Emergency Contact  Extended Emergency Contact Information  Primary Emergency Contact: Holstrom,MARTY  Mobile Phone: [phone]  Relation: Spouse  Preferred language: ENGLISH  Interpreter needed? No    Healthcare Directive  Healthcare Directive: Yes, patient has a healthcare directive  Type of Healthcare Directive: Durable power of attorney for healthcare, Living Will, Healthcare directive  Location of Healthcare Directive: Patient does not have it with him/her      Transportation  Does the Patient Need Case Management to Arrange Discharge Transport? (ex: facility, ambulance, wheelchair/stretcher, Medicaid, cab, other): No  Will the Patient Use Family Transport?: Yes  Transportation Name, Phone and Availability #1: wife, Sharl Ma [phone]    Expected Discharge Date  09/06/2021     Living Situation Prior to Admission  ? Living Arrangements  Type of Residence: Home, independent  Living Arrangements: Spouse/significant other (Pt lives with wife, Sharl Ma. Son is building a new home and is currently staying with them.)  Financial risk analyst / Tub: Database administrator has built in Brewing technologist, hhsh and grab bars.)  How many levels in the residence?: 1  Can patient live on one level if needed?: N/A  Does residence have entry and/or inside stairs?: Yes (2, 6 inch  steps with 2 rails at back door.)  Assistance needed prior to admit or anticipated on discharge: Yes (anticpated)  Who provides assistance or could if needed?: wife, Sharl Ma consistently and son, intermittently  Are they in good health?: Yes  Can support system provide 24/7 care if needed?: Yes  ? Level of Function   Prior level of function: Independent (Pt would use rw on ocassion.)  ? Cognitive Abilities   Cognitive Abilities: Alert and Oriented, Participates in decision making, Understands nature of health condition    Financial Resources  ? Coverage  Primary Insurance: Medicare  Secondary Insurance: Medicare Supplement Administrator)  Additional Coverage: RX (Pt reports they are currently in the donut hole so meds can be pricey. They fill at [facility] in [place].)    ? Source of Income   Source Of Income: Other retirement income, SSI (Retired from Horticulturist, commercial co and Insurance account manager.)  ? Financial Assistance Needed?  NA, pt is in donut hole for medications.    Psychosocial Needs  ? Mental Health  Mental Health History: No  ? Substance Use History  Substance Use History Screen: No  ? Other  NA    Current/Previous Services  ? PCP  Caren Griffins, [phone], [phone]  ? Pharmacy    [facility] - [place], [place] - [address]  [address]  [place] [address]  Phone: [phone] Fax: [phone]    ? Durable Psychologist, educational at home: Grab bars, [employer], Crutches  ? Home Health  Receiving home health: In the past  Agency name: [facility] in the past and set up with [facility] and they never came due to hospitalization  Would patient use this agency again?: Yes (Pt would like to use [facility] at dc if needed.)  ? Hemodialysis or Peritoneal Dialysis  Undergoing hemodialysis or peritoneal dialysis: No  ? Tube/Enteral Feeds  Receive tube/enteral feeds: No  ? Infusion  Receive infusions: No  ? Private Duty  Private duty help used: No  ? Home and Community Based Services  Home and community based services: No  ? [employer]: N/A  ? Hospice  Hospice: No  ? Outpatient Therapy  PT: In the past  When did patient receive care?: 2023  Name of rehab location/group: [facility]  Would patient return for future services?: No  OT: No  SLP: No  ? Skilled Nursing Facility/Nursing Home  SNF: No  NH: No  ? Inpatient Rehab  IPR: In the past  When did patient receive care?: 2023  Name of Facility: [facility]  Would patient return for future services?: No  ? Long-Term Acute Care Hospital  LTACH: No  ? Acute Hospital Stay  Acute Hospital Stay: No  Hilarie Fredrickson, LMSW   *[phone]  [phone]

## 2021-08-24 NOTE — Progress Notes
OCCUPATIONAL THERAPY  NOTE   Name: Jimmy Waters   MRN: [id number]     DOB: 1955/12/28      Age: 66 y.o.  Admission Date: 08/22/2021     LOS: 2 days     Date of Service: 08/24/2021       08/24/21 1400   Time Calculation   Start Time 1330   Stop Time 1500   Time Calculation 90   $$ Phys ADL Skills 6 units   Subjective   Subjective Pt supine in bed upon OT arrival/exit.   Precautions   R LE Precautions RLE non-weight bearing;RLE knee immobilizer   Grooming   Grooming - Wash Face Assist No   Grooming - Wash Both Hands Assist No   Grooming Assist Stand By Assist   Grooming Comments Pt seated on shower chair washing face and hands.   Bathing   Bath/Shower Soap and water shower/bath   Bathing - Chest Assist No   Bathing - Left Arm Assist No   Bathing - Right Arm Assist No   Bathing - Abdomen Assist No   Bathing - Perineal Area Assist No   Bathing - Buttocks Assist Yes   Bathing - Left Upper Leg Assist No   Bathing - Right Upper Leg Other (Comment)  (covered)   Bathing - Left Lower Leg Including Foot Assist Yes   Bathing - Right Lower Leg Including Foot Other (Comment)  (covered)   Bathing Assist Minimal Assist   Bathing Position Shower - sitting   Bathing Equipment   (drop arm commode)   Bathing Comments Therapist covering RLE prior to shower. Pt required verbal encouragement to washing UE, trunk and perineal area. Required assist to wash buttocks while seated on commode.   Upper Body Dressing   UE Dressing Assist Minimal Assist   Upper Dressing Position Sitting in chair   Upper Dressing Comments Requires assist to doff/don over trunk   Lower Body Dressing   LE Dressing Assist Total Assist   Lower Dressing Position HOB up ___ degrees   Lower Dressing Comments Pt rolling L/R in bed to doff/donn brief/shorts over hips. total assist to thread over LEs   Toileting   Toileting-Wipe Self Assist Yes   Toileting Assist Total Assist   Toileting Comments Required total assist for posterior hygiene   Daily Care   Patient continent of bowel? Yes, no bowel program   Stool Occurrence 1   Stool Amount Small   Stool Appearance Formed   Human resources officer transfer to left   Engineer, structural Assist Moderate assistance;With one person;Stand by assistance;With two person;For safety;Requires verbal cues for sequencing;Requires cues to follow precautions   Tub/Shower Comments Slideboard transfer from wheelchair to/from drop arm commode with MOD assist of 1 and stand by assist of second person. Required cues for shoulder/hip relationship and increased time to complete.   Transfers   Transfer: Arts development officer   Transfer: Sliding board assist level Moderate assistance;With one person   Transfer: Sliding board type of assist Facilitation of trunk;Facilitation of weight shift;For safety;Increased time to complete;Requires verbal cues for sequencing;Requires cues to follow precautions;Assist with Right lower extremity   Transfer Comment Slideboard transfer from bed to/from wheelchair and wheelchair to/from commode. Pt required MOD assist for scooting and stand by to MIN assist of second person. Pt required verbal cues for head/hip relationship and follow precautions   Activity   Therapeutic Activities Pt discussing with therapist concerns about RLE weight  bearing precautions being maintained with mobility. Therapist discussing with pt various way to maintain precautions during mobility. Pt require continue education. Pt and therapist discussing shower set-up at home, providing therapist with photo of built in shower bench. Therapist explaining and discussing with pt that per the picture of shower set-up pt would be unable to complete a slideboard transfer from wheelchair to built-in shower seat. Pt will required continued education about need for drop arm shower chair for home.   Assessment   Assessment Pt continues to required total assist for LB dressing and MOD assist for functional mobility. Pt continues to be limited by anxiety and perseverating on RLE NWB precautions   Plan   Plan Comments bathing, commode transfer (slideboard vs stand pivot), LB dressing with reacher EOB vs supine   Recommendations   OT Equipment Recommendations   (Drop arm commode)   Type of Note   Type of Note Daily         Therapist: Ulyses Amor, OTR/L [id number]  Date: 08/24/2021

## 2021-08-24 NOTE — Progress Notes
Infectious Diseases Progress note    Today's Date:  08/24/2021  Admission Date: 08/22/2021    Reason for this consultation: dysuria with hx of nephrostomy tube and recurrent UTIs    Assessment:     # Penile pain  # Mild pyuria   # Recent CT findings of hypoenhancement of inferior R kidney, possible renal infarct vs infection   # Recent hx of PSAE and Klebisella pneumoniae complicated UTI associated w/ PCNU, s/p PCNU exchange (07/25/21) c/b R inferior renal artery bleeding s/p coil embolization of multiple right inferior renal artery branches 07/25/21  - 07/23/21- urine culture: Klebsiella pneumoniae (R to ampicillin, nitrofurantoin, I to pip/tazo, S to all others), PSAE (R to aztreonam, cefepime, levofloxacin, I to pip/tazo, S to all others). Completed course of meropenem on 08/01/21 while in rehab.  - 08/12/21- patient presented after a fall at home following rehab stay - imaging shows spiral fracture of R distal femur.  - 08/12/21- CT c/a/p: subcentimeter subcapsular right perinephric hematoma without discrete laceration visualized, also hypoenhancement of the inferior portion of the right kidney which may reflect infarct or infection.  - 08/12/21- UA: 2+ turbidity, 2+ blood, 3+ leukocytes, negative nitrite, packed WBCs and RBCs, and moderate bacteria. Urine culture: >100K PSAE (S to mero). Treated with meropenem from 5/23- 5/28  - 08/22/21- UA: 2+ blood, 3+ leukocytes, 10-20 WBC, no bacteria. 0-2 squamous epis. Cx: no growth.   ?  # H/o recent MRSA and MDR E.coli urine infections with urosepsis  # R ureteral injury currently w/ R PCNU in place since 3/28  # Hx of right chronic nephrolithiasis and hx of R hydronephrosis  ?  # Recent right distal femur spiral fracture s/p ORIF 08/13/21   ?  Recurrent cutaneous metastatic papillary thyroid carcinoma  - 5/4- s/p revision right central neck dissection  ?  DM2  ?  HTN  HLD        Recommendations:         Penile pain and dysuria resolved, likely from may be passing stone, no evidence of active UTI    Continue to observe off antibiotic    We will sign off please call us with questions    Medical decision making complexity is low, patient with multiple comorbidities, dysuria recurrent UTI, fracture, reviewed labs reviewed records, continue physical therapy per rehab      History of Present Illness    Jimmy Waters is a 66 y.o. patient who I am seeing today for dysuria with hx of nephrostomy tube and recurrent UTIs.    Penile pain resolved  No fever        Antimicrobial Start date End date        meropenem 5/23 5/28        Cefazolin x 1 5/21 5/21   Vancomycin x 1 5/21 5/21             Estimated Creatinine Clearance: 80.1 mL/min (based on SCr of 1.12 mg/dL).    Medications   Scheduled Meds:acetaminophen (TYLENOL EXTRA STRENGTH) tablet 1,000 mg, 1,000 mg, Oral, Q6H  atorvastatin (LIPITOR) tablet 20 mg, 20 mg, Oral, QDAY  buPROPion XL (WELLBUTRIN XL) tablet 300 mg, 300 mg, Oral, QDAY  dutasteride (AVODART) capsule 0.5 mg, 0.5 mg, Oral, QDAY  heparin (porcine) PF syringe 5,000 Units, 5,000 Units, Subcutaneous, Q8H  insulin aspart (U-100) (NOVOLOG FLEXPEN U-100 INSULIN) injection PEN 0-12 Units, 0-12 Units, Subcutaneous, TID w/ meals  insulin glargine (LANTUS SOLOSTAR U-100 INSULIN) injection PEN 15 Units, 15  Units, Subcutaneous, QHS(22)  levothyroxine (SYNTHROID) tablet 325 mcg, 325 mcg, Oral, QDAY(07)  losartan (COZAAR) tablet 12.5 mg, 12.5 mg, Oral, QDAY  melatonin tablet 5 mg, 5 mg, Oral, QHS  metoprolol tartrate (LOPRESSOR) tablet 50 mg, 50 mg, Oral, BID  phenazopyridine (PYRIDIUM) tablet 200 mg, 200 mg, Oral, TID  polyethylene glycol 3350 (MIRALAX) packet 17 g, 17 g, Oral, QDAY  sertraline (ZOLOFT) tablet 50 mg, 50 mg, Oral, QDAY  tamsulosin (FLOMAX) capsule 0.4 mg, 0.4 mg, Oral, QHS    Continuous Infusions:  PRN and Respiratory Meds:aluminum-magnesium hydroxide Q4H PRN, bisacodyL QDAY PRN, dextrose 50% (D50) IV PRN, milk of magnesium Q4H PRN, ondansetron Q6H PRN, ondansetron (ZOFRAN) IV Q6H PRN, oxyCODONE Q6H PRN **OR** oxyCODONE Q6H PRN, senna BID PRN, tiZANidine Q8H PRN      Physical Examination                          Vital Signs: Most Recent                  Vital Signs: 24 Hour Range   BP: 126/72 (06/01 0428)  Temp: 36.4 ?C (97.5 ?F) (06/01 4540)  Pulse: 76 (06/01 0428)  Respirations: 18 PER MINUTE (06/01 0428)  SpO2: 100 % (06/01 0428)  O2 Device: None (Room air) (06/01 0428) BP: (121-142)/(67-76)   Temp:  [36.4 ?C (97.5 ?F)-36.7 ?C (98 ?F)]   Pulse:  [69-79]   Respirations:  [18 PER MINUTE]   SpO2:  [96 %-100 %]   O2 Device: None (Room air)     General appearance: awake, alert, no distress  Lungs: no respiratory distress  Abdomen: soft, non-distended, tender to palpation at LLQ  Ext:  No clubbing, cyanosis or edema  Musculo: no joint swelling or effusions  Skin: no rashes/lesions  Lines:  R nephrostomy tube    Laboratory - reviewed results of below individual lab tests   Hematology  Recent Labs     08/23/21  0728 08/24/21  0722   WBC 7.2 6.5   HGB 8.8* 8.4*   HCT 28.1* 25.5*   PLTCT 324 351     Chemistry  Recent Labs     08/23/21  0728 08/24/21  0722   NA 140 141   K 3.9 4.1   CL 107 105   CO2 21 25   BUN 26* 21   CR 1.23 1.12   GLU 131* 122*   CA 9.6 9.8     Microbiology, Radiology and other Diagnostics Review   Microbiology data reviewed.    No new imaging since transferred to rehab.        Eliezer Lofts, MD   Division of Infectious Diseases   Please contact preferentially via Voalte

## 2021-08-24 NOTE — Consults
Neurorehabilitation Psychology  12:20-13:05 Pt was seen for initial evaluation with no family or significant others present.     Diagnosis: Z87.81 History of femur fracture; r/o adjustment disorder with mixed depressed and anxious mood; r/o cognitive impairment    Requesting Physician: Leavitt/Arickx  Reason for Request: Mood screens    Relevant History: The following history was taken from available medical records unless otherwise indicated. Pt is a 66 y.o., Caucasian, male admitted [facility] on [facility]21 after a ground-level fall at home, status post right lower extremity periprosthetic fracture, status post ORIF on 5/21.?ID was consulted 5/21 given concern for R kidney infection on CT and bacteruria w/ indwelling nephrostomy tube. He was started on Meropenem and completed this 5/28.?Acute hospitalization has been complicated by?cognitive impairment,?postoperative pain, postoperative anemia, AKI, thrombocytopenia, hyperglycemia?as well as impaired mobility and activities of daily living. On 08/22/2021, pt was transferred to [facility] inpatient rehab for continued acute medical management, nursing cares, and comprehensive therapies.    Medical/Surgical History: hypertension, diabetes, thyroid cancer, BPH with urinary retention, status post iatrogenic ureter injury and urosepsis bacteremia; Pt reported a history of concussion in Nov 2022 when he fell at [employer]. Reported he sustained a traumatic head injury.    Surgical History:   Procedure Laterality Date   ? OPEN TREATMENT FEMORAL SHAFT FRACTURE WITH PLATE/ SCREWS WITH/ WITHOUT CERCLAGE Right 08/13/2021    Performed by Rayford Halsted, MD at [facility]     Family Medical History: No past family medical history was found in pt's chart.    Social/Vocational History: Pt is married. He has three children. Pt reported his family was his social support. Pt reported he completed some college and does not have a history of LD/SPED. Reported he has been retired for 12 years and he previously worked as a Estate manager/land agent. Did not report a desire to return to work. Pt was driving PTA and hopes to return to driving.    Pt did not report a history of tobacco or recreational drug use. Reported he drinks alcohol infrequently. Pt reported a history of psychotropic medication that he began taking after he stopped working prescribed by his PCP. Per chart, pt is taking Wellbutrin XL 300mg  every day and Zoloft 50mg  every day. Per chart, he has a history of taking Wellbutrin SR and Trazodone. Pt reported he saw two counselors via the recommendation of his PCP and later discontinued counseling. Pt did not report a history of psychiatric hospitalizations or SI/SA/SH.     Findings: Pt was alert, oriented, and open/responsive to inquiry. The utility of testing was discussed and pt agreed to participate. Pt was cooperative and appeared to put forth adequate effort. Speech was WNL, and thought processes were connected and logical to content of conversation. Eye contact was within social norms and non-verbal behaviors were within social norms. Mood appeared irritable and affect was congruent.    Pt's reported mood was pissed as he described being displeased with this pain medication management and physical and speech therapies. Pt reported he has not been informed of consistent interval times in which he can take pain medication. Pt noted he was upset about doing weight bearing therapies in physical therapy and in doing the medication review with speech therapies. Pt reported he was unhappy about coming to IPR and was considering that he may contact his wife to leave IPR. Pt did not endorse suicidal ideation or thoughts of harm toward self or others. Reported weakness in his arms and  legs. Sleep was rated as poor due to pain and muscle spasms. Appetite was rated as improving and reported nausea. Pt reported he has not vomited. Pain was rated as an 8/10 for chronic pain and pain in his leg. Reported his pain occasionally decreases to a 6/10. Reported he has utilized a heating pad at home and watching TV as distraction for pain management. Pt reported changes in cognition in the areas of memory and confusion. Pt reported his memory and confusion have been improving. Pt was unsure whether he has experienced slowed mentation. 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 Reported he enjoys spending time with his dogs, rebuilding cars, and watching his grandson play football.    Supportive therapy was provided regarding pt's dissatisfaction with pain management and therapies. Encouraged pt to discuss his pain medication management with his rehab physicians and his concerns with PT. Pt acknowledged information. The intent and utility of testing was relayed, and pt agreed to participate as needed. The purpose and method of the neurorehabilitation psychology service, as well as the limits of confidentiality, were described to the pt. The pt verbalized understanding and agreement to participate in the evaluation.     Rehabilitation Recommendations:   1. Pt's cognition, mood, coping, and pain management will be monitored with treatment initiated as indicated.   2. Pt and family will be provided with feedback and education as appropriate.  3. Due to time constraints, provider was unable to administer anxiety, depression, and cognitive screenings. Will return to administer.  4. Pt may benefit from a discussion on non-pharmacological pain management and sleep hygiene techniques. Will attempt to return to discuss.    Expected Outcomes:  1. Pt will participate appropriately in rehabilitation therapy services.   2. Pt will likely require assistance at home to be provided by family.     Thank you for asking our advice and opinion regarding the care of this pleasant pt.     Olena Mater, Ph.D.  Licensed Psychologist  [facility]

## 2021-08-24 NOTE — Progress Notes
SPEECH-LANGUAGE PATHOLOGY  DAILY NOTE     08/24/21 1100   Behavior   Comments* Pt seated in bed, alert and participatory throughout session. No family present this date. Pt continues to frequently be tangential; however, is easily redirected with verbal cues. Assisted pt to set up meal tray for lunch at end of session. All needs met and call light within reach when SLP exited room.   Orientation Goals   Therapy Activities Cognitive log score   Cognitive Log Score (Out of 30) 26/30   Therapy Activity Comments Pt demonstrated mild difficulty with working memory (months in reverse order) and delayed recall of novel info. Pt demonstrated the most difficulty wtih attention task (fist-palm-edge).   Memory Goals   Therapy Activities Education   Education Reviewed results of RBANS assessment complete previous date with pt. Emphasized scores that fall below normal limits. Lengthy discussion re: cognitive impairment and how this may impact pt's safety in his home environment. Following discussion, pt continues to report that he feels he is at his cognitive baseline. No family present during conversation.     Discussed purpose/benefits of memory book and writing information down as compensatory strategy. Pt stated Do you want me to be honest? I wouldn't do it. 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 Offered for staff to assist with recording; however, pt declined. When asked what other ideas pt feels may be functional compensatory strategies, pt pulled up the text messages on his phone. Unclear how pt utilizes this as a memory strategy; however, pt noted he has been using this system for 30 years. Pt also noted his wife manages a Physiological scientist that is shared with him to track appts, etc.    Problem Solving Goals   Therapy Activities Medication management   Therapy Activity Comments Reviewed pt specific scheduled medication list with pt. Pt initially stated Oh I have no idea, I trust my wife. However, pt able to identify reasoning for medication in 11/15 opportunities independently, increasing to 12/15 given assistance from SLP.      Assessment Pt with decreased receptivity to education re: compensatory strategies this date. Cog Log score WFL. Anticipate pt progress will be dependent on pt's receptivity/participation.    Plan Cognition:  -- Cog Log follow up 6/3  -- ongoing education re: fxl compensatory strategies  -- Establish meaningful tasks (medication administration, caring for wounds, home safety utilizing any new DME, etc.) to target cognitive communication deficits   -- ALFA  -- FAVRES?  -- 98 card game   -- review pt specific medication list as needed      Doyne Micke MS, CCC-SLP  [employer] [phone]

## 2021-08-25 VITALS — BP 151/73 | HR 76 | Temp 98.10000°F

## 2021-08-25 VITALS — BP 138/75 | HR 78 | Temp 98.00000°F

## 2021-08-25 VITALS — BP 135/71 | HR 75

## 2021-08-25 VITALS — BP 109/61 | HR 73 | Temp 98.00000°F

## 2021-08-25 MED ORDER — POLYETHYLENE GLYCOL 3350 17 GRAM PO PWPK
17 g | Freq: Every day | ORAL | 0 refills | Status: DC | PRN
Start: 2021-08-25 — End: 2021-08-30

## 2021-08-25 MED ORDER — TIZANIDINE 4 MG PO TAB
2 mg | Freq: Three times a day (TID) | ORAL | 0 refills | Status: DC | PRN
Start: 2021-08-25 — End: 2021-08-30
  Administered 2021-08-29: 20:00:00 2 mg via ORAL

## 2021-08-25 MED ORDER — OXYCODONE 5 MG PO TAB
10 mg | ORAL | 0 refills | Status: DC
Start: 2021-08-25 — End: 2021-08-30
  Administered 2021-08-25 – 2021-08-29 (×17): 10 mg via ORAL

## 2021-08-25 MED ORDER — ACETAMINOPHEN 500 MG PO TAB
1000 mg | ORAL | 0 refills | Status: DC
Start: 2021-08-25 — End: 2021-08-30
  Administered 2021-08-25 – 2021-08-29 (×13): 1000 mg via ORAL

## 2021-08-25 MED ORDER — TIZANIDINE 4 MG PO TAB
4 mg | Freq: Every evening | ORAL | 0 refills | Status: DC
Start: 2021-08-25 — End: 2021-08-30
  Administered 2021-08-26 – 2021-08-29 (×4): 4 mg via ORAL

## 2021-08-25 NOTE — Progress Notes
PHYSICAL THERAPY       08/25/21 0930   Type of Note   Type of Note Daily   Time Calculation   Start Time 0930   Stop Time 1030   Time Calculation 60   $$ Professional Contact 1 unit   (discussion with physician)   $$ PT Therapeutic Activity 4 units   Subjective   Subjective Patient in bed at onset of session.  Agreeable to therapy.  Patient requesting anti-nausea med at onset of session due to bouts with mobility earlier in week.  Nursing provided medication   Precautions   R LE Precautions RLE non-weight bearing;RLE knee immobilizer   Bed Mobility   Bed Mobility: Supine to sit assist level Moderate assistance   Bed Mobility: Supine to sit type of assist Assist with trunk   Bed Mobility: Sit to Supine Assist Level Moderate assistance   Bed Mobility: Sit to supine type of assist Assist with trunk;Assist with right lower extremity   Transfers   Transfer: Assistive Device Sliding Board   Transfer: Sliding board assist level Contact guard assistance   Transfer: Sliding board type of assist   (assist with board placement)   Wheelchair   Wheelchair: Distance 150 feet   Wheelchair: Assistance Level Stand by assistance   Wheelchair: Propulsion Method Bilateral upper extremity;Manual wheelchair   Wheelchair: Ramp Management Minimum assistance   Wheelchair Comments Patient provided with wheelchair gloves to assist grip   Activity   PT Therapeutic Activities Patient reporting signficant pain behind R knee.  Reviewed that Physician may be able to order another brace for more support but that physician will likely talk with ortho first if patient interested.  Trialled standing in parallel bars with PT assisting to maintain RLE non-weight bearing.  Patient unable to achieve stand.   Assessment   Assessment Patient with improved ability to complete slide boards this date.  Unable to generate sufficient strength to acheive stand on LLE only.  will focus on modifications for home for use of slide board until weight bearing status is changed.   Recommendations   PT Discharge Recommendations Home with Assistance;Home Health Setting   Equipment Recommendations Sliding board   Comments patient reports he has a wheelchair at home     Raynelle Highland, DPT, NCS  Doctor of Physical Therapy, Board Certified Specialist in Neurologic Physical Therapy

## 2021-08-25 NOTE — Rehab Care Plan
Physical Medicine & Rehabilitation Individualized Overall Plan of Care    Date of Service:  08/24/2021   Name: Jimmy Waters   MRN: [id number]     DOB: 1955-12-14      Age: 66 y.o.  Admission Date: 08/22/2021     LOS: 2 days     Date of Service: 08/24/2021      Insurance:  Medicare  Date of Admission:  08/22/2021    Hospital Course: This is a 66 year old male with a past medical history of  significant for hypertension, diabetes, thyroid cancer, BPH with urinary retention, status post iatrogenic ureter injury and urosepsis bacteremia. Patient was admitted to [facility] on 08/12/2021 after a ground-level fall at home, status post right lower extremity periprosthetic fracture, status post ORIF on 5/21.?ID was consulted 5/21 given concern for R kidney infection on CT and bacteruria w/ indwelling nephrostomy tube. He was started on Meropenem and completed this 5/28. Acute hospitalization has been complicated by cognitive impairment, postoperative pain, postoperative anemia, AKI, thrombocytopenia, hyperglycemia as well as impaired mobility and activities of daily living.   ?  Patient has been working with physical and occupational therapy since 08-14-2021 and has been making functional gains. Patient is anticipated to return to home at the stand by assistance level of care. Patient will also receive formal cognitive evaluation to assess for baseline status and any new cognitive/linguistic deficits.   ?   Rehab Diagnosis: fracture - femur (Shaft)     Medical Course since IRF Admission:    The patient?s clinical course since admission has been stable.  The patient has been able to participate fully in the rehabilitation program.    Medical Prognosis: Good    The patient has a reasonable likelihood of fully participating in and completing the IRF stay based on ability to manage medical issues including dysuria, pain.  There is a reasonable expectation of several years of productive life in a community setting in which they will benefit from this stay.     This patient meets medical necessity requiring the intensity of therapy available at the [facility]. The patient can participate in and can fully benefit from the services offered in the IRF setting including 24 hour rehabilitation nursing, daily oversight from the Physiatrist, and complex interdisciplinary rehab as noted below.    Quality Indicators:  Current Level of Function  Evaluation QI Current QI   Transfer: Sit to stand assist level: Moderate assistance, With two person           Wheelchair: Distance: 75 feet (x2 rest break required during 2nd bout of propulsion due to reporting fatigue in UE)  Wheelchair: Assistance Level: Minimum assistance  Stair: Assist Level: Not safe to attempt     Grooming Assist: Stand By Assist  Bathing Assist: [employer], Stand By Assist  UE Dressing Assist: Stand By Assist  LE Dressing Assist: Total Assist  Toileting Assist: Total Assist     Shower Transfer Assist: Moderate assistance, With one person, Stand by assistance, With two person, For safety, Requires verbal cues for sequencing, Requires cues to follow precautions     Patient continent of bowel?: Yes, no bowel program                Transfer: Sit to stand assist level: Moderate assistance, With two person           Wheelchair: Distance: 75 feet (x2 rest break required during 2nd bout of propulsion due to  reporting fatigue in UE)  Wheelchair: Assistance Level: Minimum assistance  Stair: Assist Level: Not safe to attempt     Grooming Assist: Stand By Assist  Bathing Assist: Minimal Assist  UE Dressing Assist: Minimal Assist  LE Dressing Assist: Total Assist  Toileting Assist: Total Assist     Shower Transfer Assist: Moderate assistance, With one person, Stand by assistance, With two person, For safety, Requires verbal cues for sequencing, Requires cues to follow precautions     Patient continent of bowel?: Yes, no bowel program PT recommended equipment: Sliding board   OT recommended equipment:  (Drop arm commode)                   Physical Therapy Goals  Patient will perform: household mobility, community mobility, at wheelchair level, Independent  Occupational Therapy Goals  Pt will perform basic care and transfer with: Minimum assistance (with least restrictive device)    Speech Therapy Goals        Patient Will be Oriented (to Person, Place, Time and Situation) to Improve Safety and Awareness for Daily Living with: Minimum assist     Patient Will Use Appropriate Memory Strategies to Recall Recent Events, Express Needs, and Maintain Safety for Daily Living with: Moderate assist     Patient Will Demonstrate Attention Skills to Improve Safety with Daily Activities and Communication with: Moderate assist     Patient Will Be Able to Problem Solve Safety and Awareness for Daily Living Skills with: Moderate assist                                                                      Nursing Goals  Supervision and support from spouse Jimmy Waters    Additional therapeutic disciplines may be included during this stay if indicated during interdisciplinary communication and will be noted in the daily progress notes when relevant.  Social Work will address discharge planning needs.    Rehabilitation Plan    Patient will receive Physical therapy, Occupational therapy and Speech therapy each 60 minutes a day, 5 days a week for a total of 3 hours daily (minimum) for the duration of the rehabilitation stay within an interdisciplinary rehabilitation program with case manager/social worker, dietician, neuropsychologist, rehab nursing and PM&R oversight.    Rehabilitation Prognosis: Good, anticipate steady functional gains with PT, OT, and SLP to return home with family support.  Tolerance for three hours of therapy a day: Good, patient is tolerating 3 hours of therapy per day, as required.    Goals/Barriers/Facilitators  Family / Patient Goals: Patient would like to regain strength and endurance to safely return home.   Mobility Goals: Updated per therapy evaluation as above  Activities of Daily Living (ADLs) Goals: Updated per therapy evaluation as above.  Cognition / Communication Goals: Updated per therapy evaluation as above.    Discharge Planning  Expected Length of Stay 10 day(s)  Expected Discharge Disposition Home  Expected Discharge Needs PT and OT and SLP    The patient should reach their current goals by noted projected discharge date.    This plan of care was formulated based upon a review of the progress this patient made with therapies during the acute hospital stay, the goals set by the inpatient rehabilitation  therapists at the time of their initial assessment, and my expertise in caring for patients with this rehabilitation diagnosis and accompanying comorbidities.    Su Monks, MD  08/24/21 9:28 PM

## 2021-08-25 NOTE — Progress Notes
SPEECH-LANGUAGE PATHOLOGY  DAILY NOTE     08/25/21 1430   Behavior   Comments* Pt resting in bed upon arrival. Pt somewhat participatory in the beginning, growing more and more agitated towards idea of receiving speech therapy at the end. Pt refusing to sit up on bed, saying that he had just broken his femur only four days ago and that he's had multiple back surgeries incluing replaced knees.   Memory Goals   Therapy Activities Education   Education Majority of session spent discussing pt's POC during rehab stay. Pt adamant that he does not need speech therapy, repeatedly stating that he does not need to be in school again when he's already done that. Pt explicitly stating that he will not be very participatory. Pt at one point was convinced that he will not be allowed to stay at rehab if he refuses speech therapy (as he claims he was told by the OT, with whom I confirmed this to not be true), and states that he's ready to quit after this session. At end of session, pt observed to call his son to pick him up to get out of rehab. Pt and SLP called pt's wife together on speaker phone and SLP explained purpose of rehab speech therapy and the concern that pt might not be completely at baseline although much better than initially at acute hospital. As wife became supportive of speech therapy, pt asked for his phone back from SLP and ended phone call.     Problem Solving Goals   Therapy Activities Math skills   Math Skills Low level;Moderate prompting - patient able to 25-49% of the time   Therapy Activity Comments Attempted vacation travel bugeting task w/ pt. Pt verbalizing his rationale for some answers as he says he cannot write d/t his neuropathy. Pt appearing moderately confused, frequently saying I have no idea even with more explicit instructions and guidance.      Assessment Pt demonstrates poor insight into his situation..  It appears that his wife manages a lot of pt' iADLs at home including medications and finances, and his functional goals aimed at baseline might warrant shorter 30 min sessions VS 60 min. Discussed w/ OT and PT later and agreed to reduce to 30 mins.  Continue to discuss POC.    Plan Cognition:  -- Cog Log follow up 6/3  -- ongoing education re: fxl compensatory strategies  -- Establish meaningful tasks (medication administration, caring for wounds, home safety utilizing any new DME, etc.) to target cognitive communication deficits   -- ALFA  -- FAVRES?  -- 98 card game   -- review pt specific medication list as needed      Philis Kendall MA, CF-SLP [facility] [phone]

## 2021-08-25 NOTE — Progress Notes
OCCUPATIONAL THERAPY  NOTE   Name: Jimmy Waters   MRN: [id number]     DOB: 1956-02-05      Age: 66 y.o.  Admission Date: 08/22/2021     LOS: 3 days     Date of Service: 08/25/2021         08/25/21 1300   Time Calculation   Start Time 1300   Stop Time 1400   Time Calculation 60   $$ Therapeutic Activity 4 unit - 60 min   Subjective   Subjective Pt supine in bed upon OT arrival. Pt stated it is not a good afternoon, I just got off the commode with nursing and my drain was pulled out. I am talking with my wife and trying to figure out if the put it back in right. Therapist providing therapeutic listening. Pt left supine in bed with alarm set and needs met.   Precautions   R LE Precautions RLE non-weight bearing;RLE knee immobilizer   Bed Mobility   Bed Mobility: Supine to sit assist level Moderate assistance;Minimum assistance   Bed Mobility: Supine to sit type of assist Assist with trunk   Bed Mobility: Sit to Supine Assist Level Minimum assistance;Assist of two   Bed Mobility: Sit to supine type of assist Assist with trunk   Transfers   Transfer: Assistive Device None   Transfer: Squat pivot assist level Moderate assistance;With one person;With two person   Transfer: Squat pivot type of assist Facilitation of trunk;Facilitation of weight shift;For safety;Requires verbal cues for sequencing;Increased time to complete;Assist with Right lower extremity   Transfer Comment Pt declined use of slide board for transfers this session. Pt requesting to complete squat pivot transfer from bed to/from wheelchair, wheelchair to/from therapy mat. Pt required moderate assist at hips to transition from various sitting surfaces. Required MIn to CGA assist of second person for safety   Activity   Therapeutic Activities Pt required increase time to complete squat pivot transfer from wheelchair to/from therapy mat. Required moderate assist at hips to complete transfer. Pt sitting EOB with MIN-CGA for sitting balance. Therapist demonstrating and explaining lateral trunk lean L/R in preparation for pants management on commode. Pt declined attempt for lateral trunk flexion and reported dizziness requesting to return supine on mat. Therapist taking BP 153/79. Pt transitioned to sitting upright and reporting room spinning BP 117/66  and returned supine on mat. Pt agreeable to transfer to wheelchair then back to bed.   Assessment   Assessment Pt hyper verbal, tangential, perseverating on various topics limiting engagement in therapy session.   Plan   Plan Comments commode transfer (slideboard vs squat pivot), LB dressing with reacher and EOB vs supine, functional cog, scooting   Recommendations   OT Discharge Recommendations Home with intermittent supervision;Home with Home Health;Vs;Home setting w/Outpatient Therapy   OT Equipment Recommendations Shower chair   Type of Note   Type of Note Daily         Therapist: Ulyses Amor, OTR/L [id number]  Date: 08/25/2021

## 2021-08-26 VITALS — BP 134/72 | HR 95

## 2021-08-26 VITALS — BP 124/65 | HR 72

## 2021-08-26 VITALS — BP 149/69 | HR 83

## 2021-08-26 VITALS — BP 124/65 | HR 84 | Temp 98.10000°F

## 2021-08-26 VITALS — BP 121/66 | HR 68 | Temp 98.00000°F

## 2021-08-26 MED ORDER — ONDANSETRON 4 MG PO TBDI
8 mg | ORAL | 0 refills | Status: DC | PRN
Start: 2021-08-26 — End: 2021-08-30
  Administered 2021-08-28: 13:00:00 8 mg via ORAL

## 2021-08-26 MED ORDER — ONDANSETRON 4 MG PO TBDI
4 mg | Freq: Once | ORAL | 0 refills | Status: CP
Start: 2021-08-26 — End: ?
  Administered 2021-08-26: 17:00:00 4 mg via ORAL

## 2021-08-26 NOTE — Progress Notes
OCCUPATIONAL THERAPY     08/26/21 0930   Time Calculation   Start Time 0930   Stop Time 1030   Time Calculation 60   $$ Therapeutic Activity 2 unit - 30 min   $$ Therapeutic Exercise 2 units   Subjective   Subjective pt received from PT. complains of nausea and requests to return to bed. is agreeable to bed level exercise. pt also with numerous complaints regarding having to be at IPR - provided supportive listening   Precautions   R LE Precautions RLE non-weight bearing;RLE knee immobilizer   Transfers   Transfer: Scoot assist level Minimum assistance   Transfer: Scoot type of assist For safety   Transfer Comment pt scoots from wc to bed with verbal cues for safety and CGA. pt somewhat impulsive with laying down and requires increased time to get straightened in bed   Activity   Therapeutic Activities pt participate in UE exercise with 2lb weight at bed level. is hyperverbal but pleasant throughout, requires cues to remain on task. intermittent rest breaks   Assessment   Assessment Pt hyperverbal, tangential, preseverating on various topics limiting engagement in therapy session   Plan   Plan Comments commode transfer (slideboard vs squat pivot), LB dressing with reacher and EOB vs supine, functional cog, [place], OTR/L [phone]

## 2021-08-26 NOTE — Progress Notes
SPEECH-LANGUAGE PATHOLOGY  DAILY NOTE     08/26/21 1130   Behavior   Comments* Pt resting in bed watching TV when SLP arrived. Pt alert, participates in therapy with additional encouragement and use of informal/modifed tasks. Pt seated upright in bed consuming lunch when SLP exited room.    Memory Goals   Therapy Activities Working memory;Delayed recall   Delayed Recall Minimal prompting - patient able to 50-74% of the time   Working Memory Minimal prompting - patient able to 50-74% of the time   Therapy Activity Comments Integrating working memory, attention, simple mental math skills, and new learning of simple task, pt participated in card game 98. Pt required min cues to participate in task (remembering to draw a card, etc). Pt completed simple mental math MOD I and independently referenced visual detailing card values as needed. Pt demonstrated poor understanding of strategy/overall concept of game. Pt declined to participate in another trial as his lunch tray had arrived and he was hungry.     Focused on recall of education/session from PT/OT while pt consumed lunch. Pt able to recall that he has been practicing transfers without use of slideboard (per pt request); however, demonstrated decreased insight when discussing safety concerns. Additionally, pt recalls his leg getting all tangled up; however, continues to demonstrate poor receptivity when discussing safety concerns with situation.      Assessment Pt continues to demonstrate poor insight into current deficits and how this may impact his safety. Progress towards cognitive goals is guarded as pt has demonstrated limited engagement/participation thus far.    Plan Cognition:  -- Cog Log follow up 6/5  -- ongoing education re: fxl compensatory strategies  --?Establish meaningful tasks (medication administration, caring for wounds, home safety utilizing any new DME, etc.) to target cognitive communication deficits?  -- ALFA  -- FAVRES?  -- 98 card game (traget recall of rules)  -- review pt specific medication list as needed?     Havana Baldwin MS, CCC-SLP  [facility] [phone]

## 2021-08-26 NOTE — Progress Notes
Physical Medicine & Rehabilitation Progress Note       Today's Date:  08/26/2021  Admission Date: 08/22/2021  LOS: 4 days  Insurance:     Principal Problem:    History of femur fracture  Active Problems:    Peri-prosthetic femoral shaft fracture    HTN (hypertension)    HLD (hyperlipidemia)    DM2 (diabetes mellitus, type 2) (HCC)    Urethral injury    Fall    Pain, acute due to trauma    Impaired mobility and activities of daily living    Acute blood loss anemia    Hypothyroid    Nephrostomy status (HCC)                         Assessment/Plan:       Current diagnoses and medications reviewed.  Will continue on current medications.  Will also add PTA Zofran 8mg     Current Medical Problems/Risks of Medical Complications/Management  ?  *Right periprosthetic femur fracture s/p ORIF  History of multiple joint replacements  Impairments: reduced activity tolerance, NWB status, poor endurance, post-operative pain  Impaired ADLs & Mobility  Fall Risk  ?????????????>?PT/OT/SLP to address ongoing functional deficits  ?????????????>?Nonweightbearing right lower extremity until Ortho follow-up; right knee extension splint  ?????????????>?Orthopedic surgery postoperative visit scheduled for 6/6, may need to reschedule pending length of rehabilitation stay                > Pain behind knee, ortho to place updated wound care instructions 6/2 and plan for in person evaluation over weekend 6/3 or 6/4  ?  at risk for Mild TBI without neuroimaging evidence of structural intracranial injury  -injury mechanism fall?with period of confusion and PTA thereafter duration unclear  -early hospitalization mental status also complicated by acute toxic metabolic encephalopathy (meds, infection)  -Head CT 5/20 without acute intracranial injury  -denies?LOC??And states?head trauma?at time of?  -admit [facility] 15?in ED but potentially 10 en route by chart review  ?????????????>SLP cognitive treatment  ?????????????>consistent supervision recommended in acute care and assist with meds/ finances  ?  Acute postoperative pain  Pain due to trauma  ?????????????>?Heat/Ice PRN  ?????????????>?Tylenol?1000 mg every 8 hours  ?????????????>?Oxycodone 10 every 6 hours scheduled  ?????????????>?Tizanidine 2 mg every 8 hours as needed and 4mg  qhs scheduled  ?  Bowel Management  Bladder Management  History of BPH  ?????????????>?Bowel Regimen:?PRN stool softeners per patient preference  ?????????????>?PVR x3<125 mL, ISC for voumes >374mL  ?????????????>?Tamsulosin 0.4?milligrams nightly  ?  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  Acute blood loss anemia  ?????????????>?Monitor with CBC daily x5 days, MWF thereafter  ?  Dyslipidemia  ?????????????>?Atorvastatin 20 mg daily  ?  Type 2 diabetes  ?????????????>?NovoLog 0-12 units 3 times daily with meals  ?????????????>?Lantus 15 units nightly  ?????????????>?Diabetic diet  ?  Hypothyroidism  ?????????????>?3 and 25 mcg daily  ?  Hypertension  ?????????????>?Losartan 12.5 mg daily  ?????????????>?Metoprolol 50 mg twice daily    Subjective     Jimmy Waters is a 66 y.o. male.  Patient was seen in with resident physician.  No acute events reported overnight.  Patient denies any current shortness of breath, chest pain, nausea, vomiting, insomnia.  Pain slightly better with scheduled medications.  Increased nausea - previouslyused zofran ODT 8mg .      Objective                        Vital Signs: Last Filed                 Vital Signs: 24 Hour Range   BP: 121/66 (06/03 0410)  Temp: 36.7 ?C (98 ?F) (06/03 0410)  Pulse: 68 (06/03 0410)  Respirations: 18 PER MINUTE (06/03 0410)  SpO2: 99 % (06/03 0410)  O2 Device: None (Room air) (06/03 0410) BP: (121-151)/(66-75)   Temp:  [36.7 ?C (98 ?F)-36.7 ?C (98.1 ?F)]   Pulse:  [68-78]   Respirations:  [18 PER MINUTE]   SpO2:  [98 %-99 %]   O2 Device: None (Room air)   Intensity Pain Scale (Self Report): 8 (08/25/21 2110) Vitals:    08/22/21 1624   Weight: 104.9 kg (231 lb 4.2 oz)       Intake/Output Summary:  (Last 24 hours)    Intake/Output Summary (Last 24 hours) at 08/26/2021 0940  Last data filed at 08/26/2021 0612  Gross per 24 hour   Intake 250 ml   Output 1235 ml   Net -985 ml      Stool Occurrence: 1          Last Bowel Movement Date: 08/25/21             Physical Exam  VS: BP 121/66 (BP Source: Arm, Right Upper)  - Pulse 68  - Temp 36.7 ?C (98 ?F)  - Ht 182.9 cm (6')  - Wt 104.9 kg (231 lb 4.2 oz) Comment: bed was zeroed prior to - SpO2 99%  - BMI 31.36 kg/m?   Gen: NAD  HEENT: normocephalic, EOMI  Cardiovascular: well perfused extremities  Pulmonary: non-labored breathing, CTAB  Abd: S/NT/ND  Ext: No clubbing / No cyanosis / No edema / No calf pain  MS: Moves extremities spontaneously  Neuro: sensation intact to light touch      Therapy Notes & Labs Reviewed.      ATTESTATION    Patient was seen with a resident physician.  I personally observed the resident obtain the subjective information, perform the physical exam and discussed the case with the resident.  I concur with resident obtained history, physical assessment and treatment plan unless otherwise noted.    Staff name:  Don Perking, MD Date:  08/26/2021

## 2021-08-26 NOTE — Progress Notes
PHYSICAL THERAPY       08/26/21 0830   Type of Note   Type of Note Daily   Time Calculation   Start Time 0830   Stop Time 0930   Time Calculation 60   $$ PT Therapeutic Activity 3 units   $$ Wh/chr Mngt/Mob 1 unit    Subjective   Subjective Patient in bed at onset of session.  Agreeable to therapy.   Vitals*   Activity During Activity   Pulse 95   BP Patient Position Chair   BP 134/72   SpO2 97 %   Precautions   R LE Precautions RLE non-weight bearing;RLE knee immobilizer   Cognitive   Patient Behavior Cooperative   Cognition Follows Commands   Bed Mobility   Bed Mobility: Supine to sit assist level Minimum assistance   Bed Mobility: Supine to sit type of assist With rail;With head of bed elevated   Bed Mobility: Sit to Supine Assist Level Stand by assistance   Bed Mobility: Sit to supine type of assist No rail   Transfers   Transfer: Assistive Device None   Transfer: Scoot assist level Minimum assistance  (2nd person standby assist)   Transfer: Scoot type of assist   (blocking wheelchair from slideing)   Transfer Comment Patient completing bed to wheelchair, wheelchair to Nu-step and wheelchair to/from therapy mat x 4.  Assist with positioning and managing foot pedals.   Wheelchair   Wheelchair: Distance 50 feet  (x2)   Wheelchair Comments Patient reporting nausea after proplsion at [employer] of the session and reporting needing assist to propel.  Nursing provided anti-nausea med during session   Assessment   Assessment Patient able to complete scoot transfer with minimal assist.  Patient having nausea during session despite nursing adminstering anit-nausea medication.  BP and SPO2 stable with reports of nausea and light headedness.   Plan   Comments scoot pivot, wheelchair propulsion, LLE strengthening, bed mobility   Recommendations   PT Discharge Recommendations Home with Assistance;Home Health Setting   Comments patient reports he has a wheelchair at home     Raynelle Highland, DPT, NCS  Doctor of Physical Therapy, Board Certified Specialist in Neurologic Physical Therapy

## 2021-08-26 NOTE — Progress Notes
PHYSICAL THERAPY       08/26/21 1300   Type of Note   Type of Note Daily   Time Calculation   Start Time 1300   Stop Time 1330   Time Calculation 30   $$ PT Therapeutic Activity 2 units   Subjective   Subjective Patient in bed at onset of session.  Agreeable to therapy   Precautions   R LE Precautions RLE non-weight bearing;RLE knee immobilizer   Bed Mobility   Bed Mobility: Supine to sit assist level Minimum assistance   Bed Mobility: Supine to sit type of assist With rail;Assist with trunk   Bed Mobility: Sit to Supine Assist Level Stand by assistance   Transfers   Transfer: Assistive Device None   Transfer: Scoot assist level Minimum assistance   Transfer: Scoot type of assist For safety;Requires cues for positioning of device;Requires verbal cues for sequencing   Transfer Comment PT assist with wheelchair parts management.  Transfer bed to wheelchair, patient intially having difficulty initiating lift and transfer.  PT cued for scooting back and positioning prior to transfer and pt. able to complete with minimal assist.   Wheelchair   Wheelchair: Distance 100 feet  (50)   Wheelchair: Assistance Level Stand by assistance   Wheelchair: Propulsion Method Bilateral upper extremity;Manual wheelchair   Activity   PT Therapeutic Activities assisted patient to don pants in bed prior to sitting up.   Assessment   Assessment Patient needing increased cues with completion of transfers this PM.  No episodes of nausea or SOA.  Able to increase speed and distance with wheelchair propulsion.   Plan   Comments scoot pivot, wheelchair propulsion, LLE strengthening, bed mobility   Recommendations   PT Discharge Recommendations Home with Assistance;Home Health Setting     Talmadge Chad, DPT, NCS  Doctor of Physical Therapy, Board Certified Specialist in Neurologic Physical Therapy

## 2021-08-27 VITALS — BP 134/66 | HR 75 | Temp 97.60000°F

## 2021-08-27 VITALS — BP 140/65 | HR 85

## 2021-08-27 VITALS — BP 143/75 | HR 73

## 2021-08-27 NOTE — Progress Notes
Physical Medicine & Rehabilitation Progress Note       Today's Date:  08/27/2021  Admission Date: 08/22/2021  LOS: 5 days  Insurance:     Principal Problem:    History of femur fracture  Active Problems:    Peri-prosthetic femoral shaft fracture    HTN (hypertension)    HLD (hyperlipidemia)    DM2 (diabetes mellitus, type 2) (HCC)    Urethral injury    Fall    Pain, acute due to trauma    Impaired mobility and activities of daily living    Acute blood loss anemia    Hypothyroid    Nephrostomy status (HCC)                         Assessment/Plan:       Current diagnoses and medications reviewed.  Will continue on current medications.      Current Medical Problems/Risks of Medical Complications/Management  ?  *Right periprosthetic femur fracture s/p ORIF  History of multiple joint replacements  Impairments: reduced activity tolerance, NWB status, poor endurance, post-operative pain  Impaired ADLs & Mobility  Fall Risk  ?????????????>?PT/OT/SLP to address ongoing functional deficits  ?????????????>?Nonweightbearing right lower extremity until Ortho follow-up; right knee extension splint  ?????????????>?Orthopedic surgery postoperative visit scheduled for 6/6, may need to reschedule pending length of rehabilitation stay                > Pain behind knee, ortho to place updated wound care instructions 6/2 and plan for in person evaluation over weekend 6/3 or 6/4  ?  at risk for Mild TBI without neuroimaging evidence of structural intracranial injury  -injury mechanism fall?with period of confusion and PTA thereafter duration unclear  -early hospitalization mental status also complicated by acute toxic metabolic encephalopathy (meds, infection)  -Head CT 5/20 without acute intracranial injury  -denies?LOC??And states?head trauma?at time of?  -admit [facility] 15?in ED but potentially 10 en route by chart review  ?????????????>SLP cognitive treatment  ?????????????>consistent supervision recommended in acute care and assist with meds/ finances  ?  Acute postoperative pain  Pain due to trauma  ?????????????>?Heat/Ice PRN  ?????????????>?Tylenol?1000 mg every 8 hours  ?????????????>?Oxycodone 10 every 6 hours scheduled  ?????????????>?Tizanidine 2 mg every 8 hours as needed and 4mg  qhs scheduled  ?  Bowel Management  Bladder Management  History of BPH  ?????????????>?Bowel Regimen:?PRN stool softeners per patient preference  ?????????????>?PVR x3<125 mL, ISC for voumes >358mL  ?????????????>?Tamsulosin 0.4?milligrams nightly  ?  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  Acute blood loss anemia  ?????????????>?Monitor with CBC daily x5 days, MWF thereafter  ?  Dyslipidemia  ?????????????>?Atorvastatin 20 mg daily  ?  Type 2 diabetes  ?????????????>?NovoLog 0-12 units 3 times daily with meals  ?????????????>?Lantus 15 units nightly  ?????????????>?Diabetic diet  ?  Hypothyroidism  ?????????????>?3  and 25 mcg daily  ?  Hypertension  ?????????????>?Losartan 12.5 mg daily  ?????????????>?Metoprolol 50 mg twice daily    Subjective     Jimmy Waters is a 66 y.o. male.  Patient was seen in with resident physician.  No acute events reported overnight.  Patient denies any current shortness of breath, chest pain, nausea, vomiting, insomnia.      Pain slightly better with scheduled medications.      Increased nausea - previouslyused zofran ODT 8mg , and improvement in his symptoms once we restarted this 6/3.      He also notes a 2cm circular eschar over the right anterior thigh.  He feels that this is new within the last few days.  Some surrounding pain, but no redness or drainage.      Objective                        Vital Signs: Last Filed                 Vital Signs: 24 Hour Range   BP: 140/65 (06/04 0832)  Temp: 36.7 ?C (98.1 ?F) (06/03 1525)  Pulse: 85 (06/04 0832)  Respirations: 18 PER MINUTE (06/03 1525)  SpO2: 95 % (06/03 2030)  O2 Device: None (Room air) (06/03 2030) BP: (124-149)/(65-69)   Temp:  [36.7 ?C (98.1 ?F)]   Pulse:  [72-85]   Respirations:  [18 PER MINUTE]   SpO2:  [95 %-98 %]   O2 Device: None (Room air)   Intensity Pain Scale (Self Report): 8 (08/26/21 2030) Vitals:    08/22/21 1624   Weight: 104.9 kg (231 lb 4.2 oz)       Intake/Output Summary:  (Last 24 hours)    Intake/Output Summary (Last 24 hours) at 08/27/2021 0849  Last data filed at 08/27/2021 1610  Gross per 24 hour   Intake 10 ml   Output 1400 ml   Net -1390 ml      Stool Occurrence: 1          Last Bowel Movement Date: 08/26/21             Physical Exam  VS: BP (!) 140/65  - Pulse 85  - Temp 36.7 ?C (98.1 ?F)  - Ht 182.9 cm (6')  - Wt 104.9 kg (231 lb 4.2 oz) Comment: bed was zeroed prior to - SpO2 95%  - BMI 31.36 kg/m?   Gen: NAD  HEENT: normocephalic, EOMI  Cardiovascular: well perfused extremities  Pulmonary: non-labored breathing, CTAB  Abd: S/NT/ND  Ext: No clubbing / No cyanosis / No edema / No calf pain  Skin: 2cm circular eschar over the right anterior thigh.  Surgical incisions currently dressed with no/minimal strikethrough drainage  MS: Moves extremities spontaneously.  Right LE with 4/5 EHL, 5/5 dorsiflexion and plantarflexion    Neuro: sensation intact to light touch through RLE diffusely (minimal decrease surrounding area of surgery      Therapy Notes & Labs Reviewed.      ATTESTATION    Patient was seen with a resident physician.  I personally observed the resident obtain the subjective information, perform the physical exam and discussed the case with the resident.  I concur with resident obtained history, physical assessment and treatment plan unless otherwise noted.    Staff name:  Don Perking, MD Date:  08/27/2021

## 2021-08-28 ENCOUNTER — Encounter: Admit: 2021-08-28 | Discharge: 2021-08-28 | Payer: MEDICARE

## 2021-08-28 VITALS — BP 150/77 | HR 86 | Temp 97.70000°F

## 2021-08-28 VITALS — BP 154/73 | HR 79 | Temp 98.00000°F

## 2021-08-28 VITALS — BP 134/66 | HR 81

## 2021-08-28 VITALS — BP 124/68 | HR 69 | Temp 98.20000°F | Wt 230.2 lb

## 2021-08-28 DIAGNOSIS — C73 Malignant neoplasm of thyroid gland: Secondary | ICD-10-CM

## 2021-08-28 DIAGNOSIS — F419 Anxiety disorder, unspecified: Secondary | ICD-10-CM

## 2021-08-28 DIAGNOSIS — M1990 Unspecified osteoarthritis, unspecified site: Secondary | ICD-10-CM

## 2021-08-28 DIAGNOSIS — K2270 Barrett's esophagus without dysplasia: Secondary | ICD-10-CM

## 2021-08-28 DIAGNOSIS — K319 Disease of stomach and duodenum, unspecified: Secondary | ICD-10-CM

## 2021-08-28 DIAGNOSIS — Z8719 Personal history of other diseases of the digestive system: Secondary | ICD-10-CM

## 2021-08-28 DIAGNOSIS — E039 Hypothyroidism, unspecified: Secondary | ICD-10-CM

## 2021-08-28 DIAGNOSIS — E785 Hyperlipidemia, unspecified: Secondary | ICD-10-CM

## 2021-08-28 DIAGNOSIS — M549 Dorsalgia, unspecified: Secondary | ICD-10-CM

## 2021-08-28 DIAGNOSIS — A498 Other bacterial infections of unspecified site: Secondary | ICD-10-CM

## 2021-08-28 DIAGNOSIS — Z9889 Other specified postprocedural states: Secondary | ICD-10-CM

## 2021-08-28 DIAGNOSIS — K3184 Gastroparesis: Secondary | ICD-10-CM

## 2021-08-28 DIAGNOSIS — IMO0002 Squamous cell carcinoma: Secondary | ICD-10-CM

## 2021-08-28 DIAGNOSIS — N390 Urinary tract infection, site not specified: Secondary | ICD-10-CM

## 2021-08-28 DIAGNOSIS — K219 Gastro-esophageal reflux disease without esophagitis: Secondary | ICD-10-CM

## 2021-08-28 DIAGNOSIS — W19XXXA Unspecified fall, initial encounter: Secondary | ICD-10-CM

## 2021-08-28 DIAGNOSIS — E119 Type 2 diabetes mellitus without complications: Secondary | ICD-10-CM

## 2021-08-28 DIAGNOSIS — I10 Essential (primary) hypertension: Secondary | ICD-10-CM

## 2021-08-28 DIAGNOSIS — G629 Polyneuropathy, unspecified: Secondary | ICD-10-CM

## 2021-08-28 DIAGNOSIS — M545 Chronic lower back pain: Secondary | ICD-10-CM

## 2021-08-28 DIAGNOSIS — R519 Generalized headaches: Secondary | ICD-10-CM

## 2021-08-28 DIAGNOSIS — N400 Enlarged prostate without lower urinary tract symptoms: Secondary | ICD-10-CM

## 2021-08-28 DIAGNOSIS — E3120 Multiple endocrine neoplasia [MEN] syndrome, unspecified: Secondary | ICD-10-CM

## 2021-08-28 DIAGNOSIS — E041 Nontoxic single thyroid nodule: Secondary | ICD-10-CM

## 2021-08-28 DIAGNOSIS — C4490 Unspecified malignant neoplasm of skin, unspecified: Secondary | ICD-10-CM

## 2021-08-28 DIAGNOSIS — D539 Nutritional anemia, unspecified: Secondary | ICD-10-CM

## 2021-08-28 DIAGNOSIS — N62 Hypertrophy of breast: Secondary | ICD-10-CM

## 2021-08-28 DIAGNOSIS — R2689 Other abnormalities of gait and mobility: Secondary | ICD-10-CM

## 2021-08-28 DIAGNOSIS — N189 Chronic kidney disease, unspecified: Secondary | ICD-10-CM

## 2021-08-28 DIAGNOSIS — N529 Male erectile dysfunction, unspecified: Secondary | ICD-10-CM

## 2021-08-28 DIAGNOSIS — B999 Unspecified infectious disease: Secondary | ICD-10-CM

## 2021-08-28 DIAGNOSIS — N200 Calculus of kidney: Secondary | ICD-10-CM

## 2021-08-28 DIAGNOSIS — K5732 Diverticulitis of large intestine without perforation or abscess without bleeding: Secondary | ICD-10-CM

## 2021-08-28 MED ORDER — PHENAZOPYRIDINE 100 MG PO TAB
200 mg | Freq: Three times a day (TID) | ORAL | 0 refills | Status: DC
Start: 2021-08-28 — End: 2021-08-30
  Administered 2021-08-28 – 2021-08-29 (×4): 200 mg via ORAL

## 2021-08-28 NOTE — Progress Notes
OCCUPATIONAL THERAPY  NOTE   Name: Jimmy Waters   MRN: [id number]     DOB: 1955/04/20      Age: 66 y.o.  Admission Date: 08/22/2021     LOS: 6 days     Date of Service: 08/28/2021         08/28/21 1300   Time Calculation   Start Time 1300   Stop Time 1400   Time Calculation 60   $$ Phys ADL Skills 1 unit   $$ Therapeutic Activity 3 unit - 45 min   Subjective   Subjective Pt supine in bed upon OT arrival/exit with alarm set and needs met. Upon OT arrival pt stated So here is the deal, I am going to need a minute to speak with my brother and attorney about everything that is going on here. Pt reporting I have been to a skilled nursing faciliate in [place] prior to this and they are terrible. Therapist discussing with pt potential options for skilled nursing facilitates in [facility] area or other discharge plans.   Precautions   R LE Precautions RLE non-weight bearing;RLE knee immobilizer   Upper Body Dressing   UE Dressing Assist Stand By Assist   Upper Dressing Position Sitting edge of bed   Upper Dressing Comments Set-up assist   Transfers   Transfer: Assistive Device None   Transfer: Scoot assist level Contact guard assistance   Transfer: Scoot type of assist For safety   Transfer Comment Pt completed scoot transfer from bed to/from wheelchair with contact guard to stand by assist. Therapist positioning self in front of pt for safety, pt requesting therapist to step back during transfer. Pt completed scoot transfer from wheelchair to/from therapy mat with close stand by assist.   Activity   Therapeutic Activities Pt seated EOM, completed simulated LB dressing to replicate commode pants management. Pt using reacher to thread LEs into theraband loop and lateral trunk flexion L/R with contact guard assist donning over hips. Pt discussing home commode set-up and reported having grab bar positioning in front to assist pt to stand for pants management. Completed lateral scooting on therapy mat with stand by assist x5. Pt requesting to attempt standing with RW. Therapist raising height of therapy mat and instructing pt positioning to transition from sitting to standing. Pt attempted x5 to transition from sitting to stand, unable to clean hips off mat while maintaining RLE NWB precautions.   Assessment   Assessment Pt limited during therapy session due to preservation on d/c plan and family matters   Plan   Plan Comments commode transfer (slideboard vs squat pivot), LB dressing with reacher and EOB vs supine, functional cog, scooting   Recommendations   OT Discharge Recommendations Home with intermittent supervision;Home with Home Health;Vs;Home setting w/Outpatient Therapy   OT Equipment Recommendations Shower chair;Commode   Weekly Goals   Patient Will Perform LE Dressing w/ Moderate Assist;Partly Met   Patient Will Perform Grooming w/ Stand By Assist;Partly Met   Patient Will Perform Toileting w/ Moderate Assist;Partly Met   Pt Will Transfer To Bedside Commode w/ Moderate Assist;Maintaining Weight Bearing Status;Partly Met   Goals for the stay   Pt will perform basic care and transfer with Minimum assistance;Wheelchair level;Progressing   Type of Note   Type of Note Daily         Therapist: Ulyses Amor, OTR/L [id number]  Date: 08/28/2021

## 2021-08-28 NOTE — Case Management (ED)
Case Management Progress Note    NAME:Jimmy Waters                          MRN: [id number]              DOB:05/31/1955          AGE: 66 y.o.  ADMISSION DATE: 08/22/2021             DAYS ADMITTED: LOS: 6 days      Today's Date: 08/28/2021    PLAN: Dc plan is ongoing.    Expected Discharge Date: 09/06/2021   Is Patient Medically Stable: No, Please explain: continued rehab goals.  Are there Barriers to Discharge? yes (limited to no support at home, pt not willing to dc to SNF)    INTERVENTION/DISPOSITION:  ? Discharge Planning                 ? Transportation              Does the Patient Need Case Management to Arrange Discharge Transport? (ex: facility, ambulance, wheelchair/stretcher, Medicaid, cab, other): No  Will the Patient Use Family Transport?: Yes  Transportation Name, Phone and Availability #1: wife, Jimmy Waters [phone]  ? Support              Support: Pt/Family Updates re:POC or DC Plan   SW returned call to wife, Jimmy Waters [phone] who reports pt has been verbally abusive to her and her children since they won't come pick him up and he reports he is ready to dc.  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 She said this lead to other substance abuse and one lead to hospitalization at [facility]. She said most recently he attempted to get oxycodone from PCP again. Jimmy Waters reports 4-5 years ago she filed for divorce and she is afraid this hospitalization and the way he is talking to her will lead to another divorce because he is asking for one if she doesn't take him home.    Jimmy Waters reports she cannot provide care to pt at home and he needs to at least be weight bearing. She is in agreement with team talking to him about SNF. She reports her dgt's will not help him and their son, Jimmy Waters who is currently staying with them works 10-12 hours a day and then works on building his home when he is not working.  Jimmy Waters reports Jimmy Waters has told her to take his phone from him so he can't keep calling friends and old coworkers to come pick him up. Jimmy Waters stated she wasn't going to do that, but believes all of his kids are done with how he is acting.    Jimmy Waters plans to arrive on rehab around 11am tomorrow for team conference updates.    SW met with pt to discuss dc planning. Pt agreed he wasn't thrilled with [facility], but reports he is not willing to go to SNF as he has been before and it wasn't good. SW shared per discussion with family it sounds as though he doesn't have support at home and he said his son or nephew could help. Pt stated his nephew is retired and can help him. SW inquired if he has asked his nephew for help and he said he is my nephew and he will help.    SW encouraged pt to work with therapy to identify home  modifications which they suggest due to inability to do stairs. Pt reports he will have no issues with getting into stairs. SW provided further encouragement.    ? Info or Referral                 ? Positive SDOH Domains and Potential Barriers                   ? Medication Needs                                ? Financial                 ? Legal                 ? Other                 ? Discharge Disposition               Selected Continued Care - Admitted Since 08/22/2021    No services have been selected for the patient.           Hilarie Fredrickson, LMSW   [phone]  [phone]

## 2021-08-28 NOTE — Progress Notes
SPEECH-LANGUAGE PATHOLOGY                     08/28/21 1430   Pragmatics   Comments* Pt with decreased insight/awareness into cognitive deficits. Became frustrated when discussing previous SLP sessions- responses not logical in sequencing.   Behavior   Comments* Pt laying supine in bed when SLP entered. Refused to transfer to wc for therapy. Completed session at bedside. Pt alert and agreeable.   Memory Goals   Therapy Activities Delayed recall   Therapy Activity Comments Attempted delayed recall tasks, including utilizing attention and repetition to improve recall of safety strategies provided by PT/OT. Pt with decreased insight; refused to participate.   Attention Goals   Therapy Activities Sustained attention   Therapy Activity Comments SLP attempted to target sustained attention by engaging pt in finding meaningful tasks to address goals of independence. Pt required maximum verbal cues to attend to task of establishing tasks he would be willing to participate in next session. Task duration- 20 minutes     Assessment Pt continues to demonstrate poor insight into current deficits and how this may impact his safety. Progress towards cognitive goals is guarded as pt has demonstrated limited engagement/participation thus far.    Plan Cognition:  -- Cog Log follow up 6/5 if pt agreeable   -- ongoing education re: fxl compensatory strategies  -- Follow up with tasks regarding PT/OT recommendations, including safe transfers, required DME use, etc.           Fermin Schwab, MA CCC-SLP

## 2021-08-28 NOTE — Rehab Team Weekly Goals
Encourage participation.

## 2021-08-28 NOTE — Progress Notes
PHYSICAL THERAPY       08/28/21 0830   Type of Note   Type of Note Daily   Time Calculation   Start Time 0930   Stop Time 1035   Time Calculation 65   $$ Professional Contact 1 unit    $$ PT Therapeutic Activity 2 units   $$ Therapeutic Exercise  1 unit    $$ Wh/chr Mngt/Mob 1 unit    Subjective   Subjective Patient in bed at onset of session.  Agreeable to therapy   Precautions   R LE Precautions RLE non-weight bearing;RLE knee immobilizer   Bed Mobility   Bed Mobility: Supine to sit assist level Minimum assistance   Bed Mobility: Supine to sit type of assist With rail;With head of bed elevated   Bed Mobility: Sit to Supine Assist Level Stand by assistance   Bed Mobility: Sit to supine type of assist No rail   Transfers   Transfer: Assistive Device None   Transfer: Scoot assist level Contact guard assistance   Transfer: Scoot type of assist For safety;Requires verbal cues for sequencing   Transfer Comment completed Bed to/from wheelchair and wheelchair to/from mat x2   Wheelchair   Wheelchair: Assistance Level Independent   Wheelchair: Propulsion Method Bilateral upper extremity   Wheelchair: Ramp Management Minimum assistance   Wheelchair Comments Patient able to remove B foot pedals with cues to locate release.  able to remove arm rest but unable to return it to position when flipped back due to limited shoulder extension   Activity   PT Therapeutic Activities seated L hip flexion, L knee extension with 2# weight, hip ab/adduction with green theraband, R ankle pumps.  10 reps x 2 for all   Assessment   Assessment Patient requiring decreased assist with scoot pivot transfers.  Able to complete wheelchair foot pedal management with cues for release positioning   Plan   Comments scoot pivot, wheelchair propulsion, LLE strengthening, bed mobility   Recommendations   PT Discharge Recommendations Home with Assistance;Home Health Setting   Comments patient reports he has a wheelchair at home   Weekly Goals   Patient will perform sit to supine with Stand by assistance;Achieved;Independent   Patient will perform supine to sit with Minimum assistance;Achieved  (New goal for minimal assist without rail)   Patient will complete sit to stand transfer with Moderate assistance;Not progressing   Patient will complete stand to sit transfer with Minimum assistance;Not progressing   Patient will complete stand pivot transfer with Minimum assistance;Not progressing   Patient will complete sliding board transfer with   (discontinued slide board use)   Patient will complete scoot transfer with Stand by assistance   Patient will ascend/descend 2 stairs;2 rail;Not progressing   Patient will propel manual wheelchair up and down ramp with Independence;Progressing   Patient will propel manual wheelchair on level surfaces with Independence;Achieved   Goal(s) for the Stay   Patient will perform household mobility;community mobility;at wheelchair level;Independent     Raynelle Highland, DPT, NCS  Doctor of Physical Therapy, Board Certified Specialist in Neurologic Physical Therapy

## 2021-08-29 VITALS — BP 143/68 | HR 77 | Temp 97.60000°F

## 2021-08-29 VITALS — BP 147/71 | HR 79 | Temp 97.80000°F

## 2021-08-29 VITALS — BP 157/90 | HR 77

## 2021-08-29 DIAGNOSIS — R2689 Other abnormalities of gait and mobility: Secondary | ICD-10-CM

## 2021-08-29 DIAGNOSIS — E785 Hyperlipidemia, unspecified: Secondary | ICD-10-CM

## 2021-08-29 DIAGNOSIS — E1165 Type 2 diabetes mellitus with hyperglycemia: Secondary | ICD-10-CM

## 2021-08-29 DIAGNOSIS — R531 Weakness: Secondary | ICD-10-CM

## 2021-08-29 DIAGNOSIS — Z794 Long term (current) use of insulin: Secondary | ICD-10-CM

## 2021-08-29 DIAGNOSIS — D62 Acute posthemorrhagic anemia: Secondary | ICD-10-CM

## 2021-08-29 DIAGNOSIS — R4189 Other symptoms and signs involving cognitive functions and awareness: Secondary | ICD-10-CM

## 2021-08-29 DIAGNOSIS — G928 Other toxic encephalopathy: Secondary | ICD-10-CM

## 2021-08-29 DIAGNOSIS — R339 Retention of urine, unspecified: Secondary | ICD-10-CM

## 2021-08-29 DIAGNOSIS — Z85850 Personal history of malignant neoplasm of thyroid: Secondary | ICD-10-CM

## 2021-08-29 DIAGNOSIS — I10 Essential (primary) hypertension: Secondary | ICD-10-CM

## 2021-08-29 DIAGNOSIS — E039 Hypothyroidism, unspecified: Secondary | ICD-10-CM

## 2021-08-29 DIAGNOSIS — M979XXD Periprosthetic fracture around unspecified internal prosthetic joint, subsequent encounter: Secondary | ICD-10-CM

## 2021-08-29 DIAGNOSIS — N401 Enlarged prostate with lower urinary tract symptoms: Secondary | ICD-10-CM

## 2021-08-29 DIAGNOSIS — S3730XD Unspecified injury of urethra, subsequent encounter: Secondary | ICD-10-CM

## 2021-08-29 DIAGNOSIS — Z6832 Body mass index (BMI) 32.0-32.9, adult: Secondary | ICD-10-CM

## 2021-08-29 LAB — CBC CELLULAR THERAPEUTICS
HEMATOCRIT: 26 % — ABNORMAL LOW (ref 40–50)
HEMOGLOBIN: 8.4 g/dL — ABNORMAL LOW (ref 13.5–16.5)
MCH: 27 pg — ABNORMAL HIGH (ref 26–34)
MCHC: 31 g/dL — ABNORMAL LOW (ref 32.0–36.0)
MCV: 85 FL — AB (ref 80–100)
MPV: 8.3 FL (ref 7–11)
PLATELET COUNT: 318 K/UL (ref 150–400)
RBC COUNT: 3 M/UL — ABNORMAL LOW (ref 4.4–5.5)
RDW: 17 % — ABNORMAL HIGH (ref >60)
WBC COUNT: 6.6 K/UL (ref 4.5–11.0)

## 2021-08-29 LAB — BASIC METABOLIC PANEL CELLULAR THERAPEUTICS
POTASSIUM: 3.8 MMOL/L (ref 3.5–5.1)
SODIUM: 141 MMOL/L — AB (ref 137–147)

## 2021-08-29 LAB — POC GLUCOSE
POC GLUCOSE: 132 mg/dL — ABNORMAL HIGH (ref 70–100)
POC GLUCOSE: 155 mg/dL — ABNORMAL HIGH (ref 70–100)
POC GLUCOSE: 207 mg/dL — ABNORMAL HIGH (ref 70–100)

## 2021-08-29 MED ORDER — DUTASTERIDE 0.5 MG PO CAP
0.5 mg | Freq: Every evening | ORAL | 0 refills | Status: AC
Start: 2021-08-29 — End: ?

## 2021-08-29 MED ORDER — POLYETHYLENE GLYCOL 3350 17 GRAM PO PWPK
17 g | Freq: Every day | ORAL | 0 refills | 22.00000 days | Status: AC | PRN
Start: 2021-08-29 — End: ?

## 2021-08-29 MED ORDER — TAMSULOSIN 0.4 MG PO CAP
.4 mg | ORAL_CAPSULE | Freq: Every evening | ORAL | 0 refills | 90.00000 days | Status: AC
Start: 2021-08-29 — End: ?

## 2021-08-29 MED ORDER — LOSARTAN 25 MG PO TAB
12.5 mg | ORAL_TABLET | Freq: Every day | ORAL | 0 refills | 90.00000 days | Status: AC
Start: 2021-08-29 — End: ?

## 2021-08-29 MED ORDER — ONDANSETRON 8 MG PO TBDI
8 mg | ORAL_TABLET | ORAL | 0 refills | 8.00000 days | Status: AC | PRN
Start: 2021-08-29 — End: ?

## 2021-08-29 MED ORDER — MELATONIN 5 MG PO TAB
5 mg | ORAL_TABLET | Freq: Every evening | ORAL | 0 refills | 28.00000 days | Status: AC
Start: 2021-08-29 — End: ?

## 2021-08-29 MED ORDER — ASPIRIN 81 MG PO TBEC
81 mg | ORAL_TABLET | Freq: Every day | ORAL | 0 refills | Status: AC
Start: 2021-08-29 — End: ?

## 2021-08-29 MED ORDER — LEVOTHYROXINE 25 MCG PO TAB
325 ug | ORAL_TABLET | Freq: Every day | ORAL | 0 refills | Status: CN
Start: 2021-08-29 — End: ?

## 2021-08-29 MED ORDER — TIZANIDINE 2 MG PO TAB
ORAL_TABLET | ORAL | 0 refills | Status: AC
Start: 2021-08-29 — End: ?

## 2021-08-29 MED ORDER — OXYCODONE 10 MG PO TAB
10 mg | ORAL | 0 refills | 6.00000 days | Status: AC
Start: 2021-08-29 — End: ?

## 2021-08-29 MED ORDER — SENNOSIDES 8.6 MG PO TAB
1 | ORAL_TABLET | Freq: Two times a day (BID) | ORAL | 0 refills | Status: AC | PRN
Start: 2021-08-29 — End: ?

## 2021-08-29 MED ORDER — HEPARIN, PORCINE (PF) 5,000 UNIT/0.5 ML IJ SYRG
5000 [IU] | SUBCUTANEOUS | 0 refills | Status: AC
Start: 2021-08-29 — End: ?

## 2021-08-29 NOTE — Progress Notes
[place] D Gitto discharged on 08/29/2021.   Marland Kitchen  Discharge instructions reviewed with patient.  Valuables returned:   ADL Belongings at Bedside: Eyeglasses/contacts, Mobility device(s)  Mobility Device(s): Walker.  Home medications:    .  Functional assessment at discharge complete: Yes .

## 2021-08-29 NOTE — Discharge Instructions - Pharmacy
Discharge Summary      Name: Jimmy Waters  Medical Record Number: [id number]        Account Number:  [id number]  Date Of Birth:  12-Jul-1955                         Age:  66 y.o.   Admit date:  08/22/2021                     Discharge date:  08/29/2021      Discharge Attending:  Dr. Tammi Klippel, MD  Discharge Summary Completed By: Darliss Cheney, MD    Service: Rehab Medicine     Reason for hospitalization:  History of femur fracture [Z87.81]    Primary Discharge Diagnosis:   History of femur fracture      Hospital Diagnoses:  Hospital Problems        Active Problems    * (Principal) History of femur fracture    Peri-prosthetic femoral shaft fracture    HTN (hypertension)    HLD (hyperlipidemia)    DM2 (diabetes mellitus, type 2) (HCC)    Urethral injury    Fall    Pain, acute due to trauma    Impaired mobility and activities of daily living    Acute blood loss anemia    Hypothyroid    Nephrostomy status (HCC)     Significant Past Medical History        Anxiety disorder  Back pain  Barrett esophagus  BPH (benign prostatic hyperplasia)  Cancer of skin  Cataract  Chronic kidney disease  Chronic lower back pain  Chronic UTI  Degenerative arthritis  Depression (disease)  Diverticulitis of colon (without mention of hemorrhage)(562.11)      Comment:  type 2  DJD (degenerative joint disease)  DM type 2 (diabetes mellitus, type 2) (HCC)  Dyslipidemia  Erectile dysfunction  Falls  Gastroparesis  Generalized headaches  GERD (gastroesophageal reflux disease)  Gynecomastia  History of dental problems      Comment:  lower partial  History of Nissen fundoplication  HLD (hyperlipidemia)  HTN (hypertension)  Hx of arthroscopy of knee  Hypothyroidism  Infection  Kidney stones  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      This is a 66 year old male?with a past medical history of??significant for hypertension, diabetes, thyroid cancer, BPH with urinary retention, status post iatrogenic ureter injury and urosepsis bacteremia.?Patient?was?admitted to[facility] after a ground-level fall at home, status post right lower extremity periprosthetic fracture, status post ORIF on 5/21.?ID was consulted 5/21 given concern for R kidney infection on CT and bacteruria w/ indwelling nephrostomy tube. He was started on Meropenem and completed this 5/28.?Acute hospitalization has been complicated by?cognitive impairment,?postoperative pain, postoperative anemia, AKI, thrombocytopenia, hyperglycemia?as well as impaired mobility and activities of daily living.?  ?  The patient was admitted to [facility] on 08/22/2021. The patient's rehabilitation course was complicated by dysuria for which infectious disease was  consulted given his history of multiple drug-resistant infections and recommended close monitoring given lack of obvious infection. Patient required multiple doses of pyridium to help treat his dysuria. He had issues with pain throughout his stay and ultimately was discharged on oxycodone 10mg  q6 h (scheduled) with tizanidine 4mg  in the evening. He was discharged with follow up appointments scheduled with Urology, Endocrinology and Orthopedics (Dr. Wilkie Aye 6/27).     The patient made functional progress is detailed in the rehab QI below.  He expressed frustration regarding discharge planning and ultimately decided he would prefer to complete his rehabilitation elsewhere.  He was ultimately discharged to [facility] on 6/6.      Rehab QI:   Evaluation QI Current QI   Transfer: Sit to stand assist level: Moderate assistance, With two person           Wheelchair: Distance: 75 feet (x2 rest break required during 2nd bout of propulsion due to reporting fatigue in UE)  Wheelchair: Assistance Level: Minimum assistance  Stair: Assist Level: Not safe to attempt     Grooming Assist: Stand By Assist  Bathing Assist: [employer], Stand By Assist  UE Dressing Assist: Stand By Assist  LE Dressing Assist: Total Assist  Toileting Assist: Total Assist     Shower Transfer Assist: Moderate assistance, With one person, Stand by assistance, With two person, For safety, Requires verbal cues for sequencing, Requires cues to follow precautions  Patient continent of bladder?: No  Patient continent of bowel?: Yes, no bowel program                Transfer: Sit to stand assist level: Moderate assistance, With two person           Wheelchair: Distance: 100 feet (50)  Wheelchair: Assistance Level: Independent  Stair: Assist Level: Not safe to attempt     Grooming Assist: Stand By Assist  Bathing Assist: Minimal Assist  UE Dressing Assist: Stand By Assist  LE Dressing Assist: Total Assist  Toileting Assist: Total Assist     Shower Transfer Assist: Moderate assistance, With one person, Stand by assistance, With two person, For safety, Requires verbal cues for sequencing, Requires cues to follow precautions  Patient continent of bladder?: No  Patient continent of bowel?: No                    PT recommended equipment: Sliding board   OT recommended equipment: Commode (drop arm)                   Items Needing Follow Up   Pending items or areas that need to be addressed at follow up: Dysuria    Pending Labs and Follow Up Radiology    Pending labs and/or radiology review at this time of discharge are listed below: if this area is blank, there are no items for review.         Medications      Medication List START taking these medications    ? heparin (porcine) PF 5,000units/0.96mL injection syringe; Dose: 5,000   Units; Inject 0.5 mL under the skin every 8 hours.; Refills: 0  ? oxyCODONE 10 mg tablet; Dose: 10 mg; Take one tablet by mouth every 6   hours.; Refills: 0  ? polyethylene glycol 3350 17 g packet; Commonly known as: MIRALAX; Dose:   17 g; Take one packet by mouth daily as needed.; Quantity: 12 each;   Refills: 0  ? senna 8.6  mg tablet; Commonly known as: SENOKOT; Dose: 1 tablet; Take   one tablet by mouth twice daily as needed.; Quantity: 180 tablet; Refills:   0  ? tiZANidine 2 mg tablet; Commonly known as: ZANAFLEX; Take two tablets by   mouth at bedtime daily. May also take one tablet three times daily as   needed.; Quantity: 120 tablet; Refills: 0     CHANGE how you take these medications    ? acetaminophen 325 mg tablet; Commonly known as: TYLENOL; Dose: 650 mg;   Refills: 0; What changed: Another medication with the same name was   removed. Continue taking this medication, and follow the directions you   see here.  ? aspirin EC 81 mg tablet; Dose: 81 mg; Take one tablet by mouth daily.   Continue to hold until instructed to resume in clinic.; Quantity: 90   tablet; Refills: 0; What changed: additional instructions, Another   medication with the same name was removed. Continue taking this   medication, and follow the directions you see here.  ? buPROPion HCL SR 150 mg tablet, 12 hr sustained-release; Commonly known   as: WELLBUTRIN SR; Dose: 150 mg; Refills: 0; What changed: Another   medication with the same name was removed. Continue taking this   medication, and follow the directions you see here.  ? dutasteride 0.5 mg capsule; Commonly known as: AVODART; Dose: 0.5 mg;   Take one capsule by mouth at bedtime daily.; Refills: 0; What changed:   when to take this, Another medication with the same name was removed.   Continue taking this medication, and follow the directions you see here.  ? insulin glargine 100 unit/mL (3 mL) subcutaneous PEN; Commonly known as:   LANTUS SOLOSTAR U-100 INSULIN; Dose: 15 Units; Doctor's comments: The   pharmacist may select Lantus Solorstar or Elon Jester based on what   is cheaper for the patient.; Inject fifteen Units under the skin at   bedtime daily.; Quantity: 45 mL; Refills: 0; What changed: Another   medication with the same name was removed. Continue taking this   medication, and follow the directions you see here.  ? * levothyroxine 150 mcg tablet; Commonly known as: SYNTHROID; Dose: 300   mcg; Refills: 0; What changed: Another medication with the same name was   removed. Continue taking this medication, and follow the directions you   see here.  ? * levothyroxine 25 mcg tablet; Commonly known as: SYNTHROID; Dose: 25   mcg; Take one tablet by mouth daily 30 minutes before breakfast. Take in   addition to 300 mcg to make total 325 mcg daily.; Quantity: 90 tablet;   Refills: 3; What changed: Another medication with the same name was   removed. Continue taking this medication, and follow the directions you   see here.  ? losartan 25 mg tablet; Commonly known as: COZAAR; Dose: 12.5 mg; Take   one-half tablet by mouth daily.; Quantity: 90 tablet; Refills: 0; Start   taking on: August 30, 2021; What changed: how much to take, Another   medication with the same name was removed. Continue taking this   medication, and follow the directions you see here.  ? melatonin 5 mg tablet; Dose: 5 mg; Take one tablet by mouth at bedtime   daily.; Quantity: 90 tablet; Refills: 0; What changed: medication   strength, how much to take, Another medication with the same name was   removed. Continue taking this medication, and follow the directions you  see here.  ? metoprolol tartrate 50 mg tablet; Commonly known as: LOPRESSOR; Dose: 50   mg; Refills: 0; What changed: Another medication with the same name was   removed. Continue taking this medication, and follow the directions you   see here.  ? MULTIVITAMIN PO; Dose: 1 tablet; Refills: 0; What changed: Another   medication with the same name was removed. Continue taking this   medication, and follow the directions you see here.  ? ondansetron 8 mg rapid dissolve tablet; Commonly known as: ZOFRAN ODT;   Dose: 8 mg; Dissolve one tablet by mouth every 8 hours as needed.;   Quantity: 30 tablet; Refills: 0; What changed: when to take this  ? sertraline 50 mg tablet; Commonly known as: ZOLOFT; Dose: 100 mg;   Refills: 0; What changed: Another medication with the same name was   removed. Continue taking this medication, and follow the directions you   see here.  ? tamsulosin 0.4 mg capsule; Commonly known as: FLOMAX; Dose: 0.4 mg; Take   one capsule by mouth at bedtime daily. Do not crush, chew or open   capsules. Take 30 minutes following the same meal each day.; Quantity: 90   capsule; Refills: 0; What changed: when to take this, Another medication   with the same name was removed. Continue taking this medication, and   follow the directions you see here.  ? triamcinolone acetonide 0.025 % topical cream; Commonly known as:   KENALOG; Refills: 0; What changed: Another medication with the same name   was removed. Continue taking this medication, and follow the directions   you see here.  * This list has 2 medication(s) that are the same as other medications   prescribed for you. Read the directions carefully, and ask your doctor or   other care provider to review them with you.     CONTINUE taking these medications    ? atorvastatin 20 mg tablet; Commonly known as: LIPITOR; Dose: 20 mg;   Refills: 0  ? DEXCOM G6 RECEIVER MISC; Refills: 0  ? DEXCOM G6 SENSOR MISC; Refills: 0  ? DEXCOM G6 TRANSMITTER MISC; Refills: 0     STOP taking these medications    ? ascorbic acid (vitamin C) 1,000 mg tablet  ? ascorbic acid (vitamin C) 500 mg tablet; Generic drug: ascorbic acid   (vitamin C)  ? betamethasone dipropionate 0.05 % topical cream; Commonly known as: BETANATE  ? CHOLEcalciferoL (vitamin D3) 1,000 units tablet; Commonly known as:   VITAMIN D3  ? CHOLEcalciferoL (vitamin D3) 50 mcg (2,000 unit) capsule  ? diclofenac sodium 1 % topical gel; Commonly known as: VOLTAREN  ? dulaglutide 0.75 mg/0.5 mL injection pen; Commonly known as: TRULICITY  ? empagliflozin 25 mg tablet; Commonly known as: JARDIANCE  ? fluocinolone 0.01 % oil; Commonly known as: DERMA-SMOOTHE  ? gabapentin 600 mg tablet; Commonly known as: NEURONTIN  ? insulin lispro (U-100) 100 unit/mL subcutaneous PEN; Commonly known as:   HumaLOG Tempo Pen(U-100)Insuln  ? insulin pump -LISPRO- Patient's Own Pump 0-6 Units  ? JARDIANCE 25 mg tablet; Generic drug: empagliflozin  ? ketoconazole 2 % topical cream; Commonly known as: NIZORAL  ? ketoconazole 2 % topical shampoo; Commonly known as: NIZORAL  ? loperamide 2 mg capsule; Commonly known as: IMODIUM A-D  ? loperamide 2 mg tablet; Commonly known as: IMODIUM  ? meclizine 25 mg tablet; Commonly known as: ANTIVERT  ? ondansetron HCL 8 mg tablet; Commonly known as: ZOFRAN  ? oxybutynin XL  15 mg tablet; Commonly known as: DITROPAN XL  ? pantoprazole DR 40 mg tablet; Commonly known as: PROTONIX  ? TRULICITY 0.75 mg/0.5 mL injection pen; Generic drug: dulaglutide  ? zinc sulfate 220 mg (50 mg elemental zinc) capsule       Return Appointments and Scheduled Appointments     Scheduled appointments:    Sep 01, 2021  1:30 PM  Office visit with [facility]  Urology: [facility] (Urology) [address]  [address], [address]  [place] [address]  [phone]   Sep 14, 2021  3:00 PM  Office visit with Lucrezia Starch, MD  Endocrinology: [facility] (Internal Medicine) [address] [place] [address]  [phone]   Feb 07, 2022  1:00 PM  (Arrive by 12:45 PM)  US THYROID with SONOGRAPHY-MOB  Imaging, Ultrasound: [facility], [facility]) [address]  [address], [address]  [place] [address]  [phone]   Feb 07, 2022  3:15 PM  Office visit with Marlowe Aschoff, MD  Otolaryngology: [facility], [facility] (ENT) [address]  [address], Suite [facility]  [place] [address]  [phone]              Consults, Procedures, Diagnostics, Micro, Pathology   Consults: ID  Surgical Procedures & Dates: None  Significant Diagnostic Studies, Micro and Procedures: noted in brief hospital course  Significant Pathology: noted in brief hospital course              Wound:      Wounds 08/13/21 0848 Surgical incision Right;Outer Thigh (Active)   08/13/21 0848 Thigh   Wound Type: Surgical incision   Pressure Injury Stages (For Pressure Injury Wound Type Only):    Pressure Injury Present On Inpatient Admission:    If this pressure injury is suspected to be device related, please select the device::    Wound/Pressure Injury Orientation: Right;Outer   Wound Location Comments:    ZXWound Location:    Wound Description (Comments):    Wound Type::    Wound Dressing Status Intact 08/29/21 0800   Wound Dressing and / or Treatment Soft roll 08/24/21 1630   Wound Securement / Protective Device Ace wrap 08/26/21 1300   Wound Drainage Amount None 08/29/21 0800   Wound Base Assessment Dressing intact, base not assessed 08/29/21 0800   Surrounding Skin Assessment Intact 08/29/21 0800   Wound Site Closure Wound Adhesive Bandage 08/29/21 0800   Number of days: 16       Wounds 08/22/21 1630 Moisture associated skin damage Anterior;Proximal;Right;Left Groin (Active)   08/22/21 1630 Groin   Wound Type: Moisture associated skin damage   Pressure Injury Stages (For Pressure Injury Wound Type Only):    Pressure Injury Present On Inpatient Admission:    If this pressure injury is suspected to be device related, please select the device::    Wound/Pressure Injury Orientation: Anterior;Proximal;Right;Left   Wound Location Comments:    ZXWound Location:    Wound Description (Comments):    Wound Type::    Wound Dressing Status Open to air 08/29/21 0800   Wound Dressing and / or Treatment A & D Ointment 08/28/21 2200   Wound Drainage Amount None 08/29/21 0800   Wound Base Assessment Moist;Pink 08/29/21 0800   Surrounding Skin Assessment Pink 08/29/21 0800   Wound Site Closure Open to Air 08/29/21 0800   Number of days: 7  Wounds 08/26/21 1747 Puncture wound Anterior;Right Thigh (Active)   08/26/21 1747 Thigh   Wound Type: Puncture wound   Pressure Injury Stages (For Pressure Injury Wound Type Only):    Pressure Injury Present On Inpatient Admission:    If this pressure injury is suspected to be device related, please select the device::    Wound/Pressure Injury Orientation: Anterior;Right   Wound Location Comments:    ZXWound Location:    Wound Description (Comments):    Wound Type::    Wound Dressing Status Intact 08/29/21 0800   Wound Dressing and / or Treatment Xeroform gauze;Gauze;Transparent (i.e. Tegaderm) 08/28/21 2200   Wound Drainage Amount None 08/29/21 0800   Wound Base Assessment Dressing intact, base not assessed 08/29/21 0800   Surrounding Skin Assessment Dry;Intact 08/29/21 0800   Wound Site Closure Wound Adhesive Bandage 08/29/21 0800   Number of days: 3                    Discharge Disposition, Condition   Patient Disposition: Rehab Facility (Not [facility]) [62]  Condition at Discharge: Stable    Code Status     Code Status History     Date Active Date Inactive Code Status Order ID          08/22/2021 1611 08/22/2021 1611 Full Code [id number]  Francesco Sor, DO Inpatient    08/12/2021 0325 08/22/2021 1611 Full Code [id number]  Pamala Hurry, DO Inpatient    Only showing the last 2 code statuses.          Patient Instructions     Scheduled appointments:    Sep 01, 2021  1:30 PM  Office visit with [facility]  Urology: [facility], [facility] (Urology) [address]  [address]  [place] [address]  [phone]   Sep 14, 2021  3:00 PM  Office visit with Lucrezia Starch, MD  Endocrinology: [facility] (Internal Medicine) [address] [place] [address]  [phone]   Feb 07, 2022  1:00 PM  (Arrive by 12:45 PM)  US THYROID with SONOGRAPHY-MOB  Imaging, Ultrasound: Main [facility], Medical [facility]) [address]  [address]  [place] [address]  [phone]   Feb 07, 2022  3:15 PM  Office visit with Marlowe Aschoff, MD  Otolaryngology: [facility], [facility] (ENT) [address]  [address], Suite [facility]  [place] [address]  [phone]            Signs and Symptoms:   Report these signs and symptoms     Report These Signs and Symptoms   As directed      Please contact your doctor if you have any of the following symptoms: temperature higher than 100.4 degrees F, uncontrolled pain, unable to have bowel movement, or drainage with a foul odor            Additional Orders: Case Management, Supplies, Home Health     Home Health/DME     None            Signed:  Darliss Cheney, MD  08/29/2021      cc:  Primary Care Physician:  Caren Griffins   Verified    Referring physicians:  Self, Referral   Additional provider(s):        Did we miss something? If additional records are needed, please fax a request on office letterhead to [phone]. Please include  the patient's name, date of birth, fax number and type of information needed. Additional request can be made by email at [email or web address]. For general questions of information about electronic records sharing, call [phone].

## 2021-08-29 NOTE — Progress Notes
PHYSICAL THERAPY       08/28/21 1500   Type of Note   Type of Note Daily   Time Calculation   Start Time 1500   Stop Time 1530   Time Calculation 30   $$ PT Therapeutic Activity 1 unit   $$ Therapeutic Exercise  1 unit    Subjective   Subjective Patient in bed at onset of session.  Agreeable to therapy.   Precautions   R LE Precautions RLE non-weight bearing;RLE knee immobilizer   Cognitive   Patient Behavior Calm;Cooperative   Cognition Follows Commands   Bed Mobility   Bed Mobility: Supine to sit assist level Stand by assistance   Bed Mobility: Supine to sit type of assist Requires extra time;With rail;With head of bed elevated   Bed Mobility: Sit to Supine Assist Level Stand by assistance   Bed Mobility: Sit to supine type of assist No rail   Transfers   Transfer: Assistive Device None   Transfer: Scoot assist level Contact guard assistance   Transfer: Scoot type of assist For safety   Transfer Comment verbal cues for release mechanism to remove foot pedals.  Minimal assist and wheelchair stabilization when moving uphill to Nu-step   Wheelchair   Wheelchair: Assistance Level Independent   Wheelchair: Propulsion Method Manual wheelchair;Bilateral upper extremity   Activity   NuStep 1-3 Minutes;Level 1  (BUE and LLE.  Patient reporting need to stop because of B shoulder pain)   Assessment   Assessment Patient continuing to requires decreased assist with scoot pivot transfer.  Patient with poor endurance and B shoulder pain noted with trial of Nu-step   Plan   Comments scoot pivot, wheelchair propulsion, LLE strengthening, bed mobility   Recommendations   PT Discharge Recommendations Home with Assistance;Home Health Setting     Talmadge Chad, DPT, NCS  Doctor of Physical Therapy, Board Certified Specialist in Neurologic Physical Therapy

## 2021-08-29 NOTE — Progress Notes
OCCUPATIONAL THERAPY  NOTE   Name: Jimmy Waters   MRN: [id number]     DOB: Mar 02, 1956      Age: 66 y.o.  Admission Date: 08/22/2021     LOS: 7 days     Date of Service: 08/29/2021         08/29/21 1500   Time Calculation   $$ Professional Contact 1 unit   Subjective   Subjective Pt supine in bed upon OT arrival with RN and spouse present. Therapist offering pt to complete shower, pt decline shower and therapy at this time. Pt stated "I really don't want to take the time to cover up my leg and all of that before I leave to go to the other place/[facility]. But thank you for asking me, I will let you know if I change my mind."   Assessment   Assessment Therapist recommend pt continue to receive occupational therapy services upon d/c to further maximize independence with ADLs while maintaining RLE NWB precautions.   Weekly Goals   Patient Will Perform LE Dressing w/ Moderate Assist;Partly Met   Patient Will Perform Grooming w/ Stand By Assist;Met  (seated in wheelchair)   Patient Will Perform Toileting w/ Moderate Assist;Partly Met   Pt Will Transfer To Bedside Commode w/ Moderate Assist;Maintaining Weight Bearing Status;Met   Type of Note   Type of Note Discharge         Therapist: Jewel Baize, OTR/L [id number]  Date: 08/29/2021

## 2021-08-29 NOTE — Progress Notes
PHYSICAL THERAPY       08/29/21 1345   Time Calculation   $$ Professional Contact 1 unit    Subjective   Subjective Entered patient's room at scheduled time.  Patient reports he is leaving for another facility.  declines activity.  PT answered any further questions of patient and wife.     Talmadge Chad, DPT, NCS  Doctor of Physical Therapy, Board Certified Specialist in Neurologic Physical Therapy

## 2021-08-29 NOTE — Rehab Team Social Worker/Case Management Support/Ongoing Services/DME
SW discussed with pt options for SNF. Wife reporting she cannot provide care to pt at home and voicing psychosocial barriers. Pt reports he does not want to go to SNF because of bad experiences and wants to go home. SW encouraged him to work with therapy further to discuss home modifications if plan is to go home from here which will be at [facility].Wife and pt agreeable to mtg at 11am tomorrow to discuss further. Wife to provide pictures of bathroom and entries of the home.    Pt lives in ranch style home in [place] with 2, 6 inch steps to enter with 2 rails at back.  Pt has walk in shower with built in bench, hhsh and grab bars.     Pt's wife, Jerrye Beavers is retired and pt reports is in good health to provide assist at [employer]. Pt reports his son is building a home, therefore staying with them. He also reports he has a dgt who is an OT at [facility] and a dgt who works in Corporate treasurer at [employer].     Pt reports he was independent PTA with occassional use of rw.  He owns rw, wc and crutches.     Pt has hx of [facility], [facility] and was set up with [facility] PTA, but did not see them due to hospitalization. He would like [facility] ([facility] at dc if hh is needed.     Pt has been to [facility], but would not return as therapist/pt ratio is 4-1.     Pt's PCP is [employer] and pt has Estate agent.  Pt has [employer] RX coverage and is currently in the donut hole, therefore his medications can be expensive until out of donut hole. Pt fills at [facility] in [place].     Pt reports concern of staff helping him maintain his [facility] precautions and wished for SW to discuss with staff at huddle. SW expressed pt's concerns with team. Team plans to address goals for rehab with pt and ensuring weight bearing precautions.

## 2021-08-29 NOTE — Progress Notes
SPEECH-LANGUAGE PATHOLOGY     08/29/21 1300   Behavior   Comments* Patient reclined in bed upon SLP arrival. Patient's wife present for duration of therapy session. Given encouragement, Patient participatory throughout.   Orientation Goals   Therapy Activities Cognitive log score   Cognitive Log Score (Out of 30) 25/30   Therapy Activity Comments Slight decrease in Cog-Log score as compared with previous administration. Deficits appreciated in the areas of memory and attention. Significant difficulty with fist-palm-edge task (1/3) and slight difficulty appreciated with stating months in reverse order (2/3) and delayed recall of address (2/3).   Patient Will Perform on the Cognitive Log with Minimal prompting - patient able to 75-90% of the time;Met   Patient Will be Oriented (to Person, Place, Time and Situation) to Improve Safety and Awareness for Daily Living with Minimum assist;Met   Memory Goals   Therapy Activities Education   Therapy Activity Comments Attempted to provided education regarding memory and attention strategies, cognitive compensatory strategies, and discharge recommendations. Patient reluctant to education, however, verbalized understanding. Patient declined written handouts regarding above education. Recommend Patient receive supervision from a cognitive standpoint, as well as assistance with medication and financial management tasks. Patient and Patient's wife verbalized understanding.   Patient Will Use a Memory Book to Recall Recent Events and Therapy Activities with   (Patient declined memory book use as functional memory strategy.)   Patient Will Demonstrate/Recall Safety Sequences with Moderate prompting - patient able to 25-49% of the time;Met   Patient Will Demonstrate Working Memory for Problem Solving Skills for Daily Living with Moderate prompting - patient able to 25-49% of the time;Met   Patient Will Use Appropriate Memory Strategies to Recall Recent Events, Express Needs, and Maintain Safety for Daily Living with Moderate assist;Met   Attention Goals   Patient Will Complete Sustained Attention Task with Moderate prompting - patient able to 25-49% of the time;Met   Patient Will Demonstrate Attention Skills to Improve Safety with Daily Activities and Communication with Moderate assist;Met   Problem Solving Goals   Patient Will Provide Appropriate Solutions to Problems of Daily Living with Moderate prompting - patient able to 25-49% of the time;Not met   Patient Will Be Able to Problem Solve Safety and Awareness for Daily Living Skills with Moderate assist;Met     Assessment Limited progress made in therapy due to Patient with decreased motivation/participation. Recommend Patient receive supervision from a cognitive standpoint and assistance with all iADLs including medication and financial management. Patient and Patient's wife verbalized understanding.    Plan On track for discharge 08/29/21.      Hortencia Conradi, MS, CCC-SLP

## 2021-08-29 NOTE — Progress Notes
OCCUPATIONAL THERAPY  NOTE   Name: Jimmy Waters   MRN: [id number]     DOB: 1956/01/29      Age: 66 y.o.  Admission Date: 08/22/2021     LOS: 7 days     Date of Service: 08/29/2021         08/29/21 1015   Time Calculation   Start Time 1015   Stop Time 1100   Time Calculation 45   $$ Therapeutic Activity 3 unit - 45 min   Subjective   Subjective Pt supine in bed upon OT arrival/exit with alarm set and needs met. Upon OT arrival pt and therapist discussing inpatient rehab goals vs transitioning to a different facility. Pt left supine with case management, spouse, and physician present.   Precautions   R LE Precautions RLE non-weight bearing;RLE knee immobilizer   Toileting   Toileting Comments Required assist to empty nephrostomy bag.   Daily Care   Urine Function Urinary device/ostomy present (See LDA)   Urine 500 mL   Urine Color Orange   Transfers   Transfer: Assistive Device None   Transfer: Scoot assist level Minimum assistance   Transfer: Scoot type of assist Facilitation of weight shift;Requires verbal cues for sequencing;For safety   Transfer Comment Required MIN assist at hips due to shorts getting caught on edge of wheelchair. Required verbal cues to remove arm rest prior to transfers from wheelchair to therapy mat and bed.   Activity   Therapeutic Activities Pt propelled wheelchair from room to therapy gym with increased time. Pt seated EOM, scooting L/R along mat with contact guard assist and verbal cues for forward trunk lean during scooting. Pt reaching forward outside base of support toward target with R/L UE to facilitate forward trunk flexion to assist with functional mobility.   Assessment   Assessment Pt required verbal cues for safety with functional mobility   Plan   Plan Comments commode transfer (slideboard vs scoot pivot), LB dressing with reacher and EOB vs supine, functional cog, scooting   Recommendations   OT Equipment Recommendations Commode  (drop arm)   Type of Note   Type of Note Daily Therapist: Ulyses Amor, OTR/L [id number]  Date: 08/29/2021

## 2021-08-29 NOTE — Progress Notes
CLINICAL NUTRITION                                                        Clinical Nutrition Initial Assessment    Name: Jimmy Waters   MRN: [id number]     DOB: 1955/10/04      Age: 66 y.o.  Admission Date: 08/22/2021     LOS: 7 days     Date of Service: 08/29/2021        Recommendation:   Diabetic 75 g/meal diet.     Comments:  66 y.o.malewith a past medical history of HTN, HLD, DMII, thyroid carcinoma (surgery 07/27/21 per ENT), nephrostomy tubes that was activated as type II trauma after fall at home resulting in R periprosthetic femur fxwho presents now for inpatient rehabilitation s/p OR 5/21 w/ Ortho for ORIF of his hip with a course notable for pain, encephalopathy,completion of anti-biotics for UTI vs kidney infection on 5/28 (meropenem).    Pt followed by clinical nutrition during acute admission and found to be at acute nutritional risk related to poor appetite after surgery. Met with pt at bedside this date. Pt reports good appetite and eating. Pt reported during visit he is discharging today to [place], [place]. Pt denied questions/concerns with nutrition currently or for after discharge. Pt not at acute nutritional risk at this time.     Nutrition Assessment of Patient:  BMI (Calculated): 31.36; BMI Categories Adult: Obesity Class I: 30-34.9;    Pertinent Allergies/Intolerances: none    Oral Diet Order: Diabetic 2100-2400 Kcal/day (75 g carb/meal, 30 g carb/HS snack);       Current Oral Intake: Adequate                   Malnutrition Assessment:   Does not meet criteria      Purvis Sheffield, MS, RDN, LD  Clinical Dietitian - Dept of Clinical Nutrition   Available on Voalte

## 2021-08-29 NOTE — Case Management (ED)
Case Management Progress Note    NAME:Jimmy Waters                          MRN: [id number]              DOB:[birth year]          AGE: 66 y.o.  ADMISSION DATE: 08/22/2021             DAYS ADMITTED: LOS: 7 days      Today's Date: 08/29/2021    PLAN: Anticipated dc to SNF once placement found.    Expected Discharge Date: 09/06/2021   Is Patient Medically Stable: Yes   Are there Barriers to Discharge? no    INTERVENTION/DISPOSITION:  ? Discharge Planning              Discharge Planning: Skilled Nursing Facility   SW sent referral to [facility]. SW explained to family they may not accept due to lateral transfer, but will send to ensure at their request.    Sw sent SNF referral to:  [facility] [address] of [place]    SW received message from [place] with [facility] [phone] and they can accept and can set up wc transport for 5:30pm today, 6/6.    SW printed transfer packet, updated bedside RN and placed transfer packet in drawer. SW faxed orders to F: [phone].    RN Report # [id number]    ? Transportation              Does the Patient Need Case Management to Arrange Discharge Transport? (ex: facility, ambulance, wheelchair/stretcher, Medicaid, cab, other): No  Will the Patient Use Family Transport?: Yes  Transportation Name, Phone and Availability #1: wife, Jimmy Waters [phone]  ? Support              Support: Pt/Family Updates re:POC or DC Plan   SW met with pt and wife, Jimmy Waters along with Dr. Tammi Waters and Dr. Maryland Waters to provide team conference updates and discuss dc planning.  Pt reports he wishes to dc from [facility] as he is unhappy and prefers to go to SNF.    Wife asked pt if he would like to try [facility] since he liked it in the past and he was agreeable, however SW educated lateral transfers are not common, but they will send referral.    Pt wishes to go as soon as possible, but aware SW may not have answer until tomorrow. Pt asked wife to choose agencies.  Jimmy Waters chose [address], [address] and [facility].    UPDATE  SW met with pt and wife, Jimmy Waters to update [facility] can accept today and will be here at 5:30pm with wc Zenaida Niece for transport. Pt and Jimmy Waters happy with plan.    SW updated team of plan for dc to [facility].    ? Info or Referral                 ? Positive SDOH Domains and Potential Barriers                   ? Medication Needs                          ? Financial                 ? Legal                 ?  Other                 ? Discharge Disposition        Selected Continued Care - Admitted Since 08/22/2021     [place] Destination Coordination complete.    Service Provider Selected Services Address Phone Fax Patient [facility] [address] [place], [place] [address] [phone] --                Hilarie Fredrickson, LMSW   [phone]  [phone]

## 2021-08-29 NOTE — Rehab Team Conference
Team Conference Note     Date of Admission:  08/22/2021  Date of Team Conference:  08/29/2021   JAWAD WIACEK is a 66 y.o. male.     DOB: 10-05-1955                  MRN#:  [id number]    Team Conference   Attendees: Odie Sera Attending Physician; Delmer Islam MD, Resident Physician; Hilarie Fredrickson SW, Social Work; Michaelyn Barter RN, Program Coordinator; Lucendia Herrlich, OTR, Occupational Therapy; Raynelle Highland, DPT, Physical Therapy; Hortencia Conradi SLP, Speech Therapy; Jeneen Montgomery, Recreational Therapist; Marilynne Halsted PharmD, Pharmacy; Silvio Clayman RD LD, Clinical Nutrition; [place] Bradshaw RN, Nursing     Medical Update: SOTERO BRINKMEYER is a 66 y.o. male admitted to rehabilitation for Right periprosthetic femur fracture s/p ORIF  Medical updates this week: Monitoring dysuria, adjusting pain medications  Medical barriers to discharge: WB status and social concerns  Other Follow-up Appointments: Ortho (To see after D/C), Urology    Neuropsychology: Refusing participation in speech therapy. Doesn't want to return to work (retired). Reports his mood is pissed. Enjoys rebuilding cars and watching his grandson play football.    Team Goal:  Patient will perform: household mobility, community mobility, at wheelchair level, Independent, scoot pivots      Pt will perform basic care and transfer with: Minimum assistance, Wheelchair level, Progressing     Discharge Planning     Discharge Date:  09/06/2021    Meeting with patient and wife today at 11am to discuss discharge planning.  Patient not being realistic.   Little support at home  NWB  Doesn't want to do any modifications to his home.  Doesn't want to go to SNF  Doesn't want to be at IRF either    Wife reports patient has had a few head injuries.    Plan Ongoing SLP, assistance with meds/finances, ST/PT/OT     DME drop arm commode (owns commode unsure if drop arm), Patient reports owning a wheelchair but unsure if the arm rests remove    Rehabilitation Plan     Progress  Cog-Log scores: Will not participate  Pt demonstrates poor carryover of information and education from staff (refused memory book)  Refusing assessment of medication/finances   Pt typically requires moderate assistance for functional problem solving/reasoning tasks   Pt demonstrates decreased attention skills affecting ability to sustain attention to topics and tasks  Pt appears more receptive to cognitive goals related to PT/OT skills  Patient is completing scoot pivot transfers to even surfaces with verbal cues.    Barriers/Concerns  Significantly poor insight/awareness into deficits  Cognition (poor carryover of education, attention deficits, reduced problem solving/reasoning for daily activities)  Limited participation/motivation  Unknown who will provide assistance with medication/finance management upon discharge  High burden of care   Patient has steps into home and reports it can not be ramped.  Patient can not physically complete stairs.  Patient reports bed is high and likely will not be able to complete a scoot pivot.  Patient is unable to complete a stand pivot transfer  Currently patient has no family assist   Patient reports only vehicles for discharge are trucks or SUVs which he can not stand to enter  MOD-MIN assist for ADLs  medications, pain, diabetes, wound care management.    Plan  scoot pivot, wheelchair propulsion, LLE strengthening, bed mobility  commode transfer (slideboard vs squat pivot), LB dressing with reacher and EOB vs supine, functional cog, scooting  patient with c/o constant RLE pain, intermittent penile pain, reports some relief with current scheduled medications, surgical RLE dressing in place, uses call light for assistance.  NWB status RLE    Weekly Team Goals  Encourage participation    Entering home? Ramp?  High bed  Pictures of his home  His own wheelchair and bedside commode evaluation??  Car transfer - need a sedan  Nephrostomy tube   Encourage independence with hygiene  KELS assessment for safety in the home  Medication management    Goals      Weekly Goals  Patient will perform sit to supine with: Stand by assistance, Achieved, Independent  Patient will perform supine to sit with: Minimum assistance, Achieved (New goal for minimal assist without rail)  Patient will complete sit to stand transfer with: Moderate assistance, Not progressing  Patient will complete stand to sit transfer with: Minimum assistance, Not progressing  Patient will complete stand pivot transfer with: Minimum assistance, Not progressing  Patient will complete sliding board transfer with:  (discontinued slide board use)  Patient will complete scoot transfer with: Stand by assistance  Patient will ascend/descend: 2 stairs, 2 rail, Not progressing  Patient will propel manual wheelchair up and down ramp with: Independence, Progressing  Patient will propel manual wheelchair on level surfaces with: Independence, Achieved  Weekly Goals  Patient Will Perform LE Dressing: w/ Moderate Assist, Partly Met  Patient Will Perform Grooming: w/ Stand By Assist, Partly Met  Patient Will Perform Toileting: w/ Moderate Assist, Partly Met  Pt Will Transfer To Bedside Commode: w/ Moderate Assist, Maintaining Weight Bearing Status, Partly Met     Quality Indicators     Swallowing/Nutritional Status: Regular food  Bladder Continence: 9-Not applicable (Indwelling catheter or ostomy) (nephro tube, few episodes of incontinence)  Bowel continence: 1-Occasionally incontinent (1 episode of incontinence in the last 3 days, all others continent)

## 2021-08-31 ENCOUNTER — Encounter: Admit: 2021-08-31 | Discharge: 2021-08-31 | Payer: MEDICARE

## 2021-08-31 ENCOUNTER — Ambulatory Visit: Admit: 2021-08-31 | Discharge: 2021-08-31 | Payer: MEDICARE

## 2021-08-31 DIAGNOSIS — N189 Chronic kidney disease, unspecified: Secondary | ICD-10-CM

## 2021-08-31 DIAGNOSIS — R2689 Other abnormalities of gait and mobility: Secondary | ICD-10-CM

## 2021-08-31 DIAGNOSIS — R519 Generalized headaches: Secondary | ICD-10-CM

## 2021-08-31 DIAGNOSIS — F419 Anxiety disorder, unspecified: Secondary | ICD-10-CM

## 2021-08-31 DIAGNOSIS — N400 Enlarged prostate without lower urinary tract symptoms: Secondary | ICD-10-CM

## 2021-08-31 DIAGNOSIS — M545 Chronic lower back pain: Secondary | ICD-10-CM

## 2021-08-31 DIAGNOSIS — K5732 Diverticulitis of large intestine without perforation or abscess without bleeding: Secondary | ICD-10-CM

## 2021-08-31 DIAGNOSIS — Z8719 Personal history of other diseases of the digestive system: Secondary | ICD-10-CM

## 2021-08-31 DIAGNOSIS — N529 Male erectile dysfunction, unspecified: Secondary | ICD-10-CM

## 2021-08-31 DIAGNOSIS — M549 Dorsalgia, unspecified: Secondary | ICD-10-CM

## 2021-08-31 DIAGNOSIS — E119 Type 2 diabetes mellitus without complications: Secondary | ICD-10-CM

## 2021-08-31 DIAGNOSIS — C4490 Unspecified malignant neoplasm of skin, unspecified: Secondary | ICD-10-CM

## 2021-08-31 DIAGNOSIS — B999 Unspecified infectious disease: Secondary | ICD-10-CM

## 2021-08-31 DIAGNOSIS — E039 Hypothyroidism, unspecified: Secondary | ICD-10-CM

## 2021-08-31 DIAGNOSIS — W19XXXA Unspecified fall, initial encounter: Secondary | ICD-10-CM

## 2021-08-31 DIAGNOSIS — IMO0002 Squamous cell carcinoma: Secondary | ICD-10-CM

## 2021-08-31 DIAGNOSIS — N200 Calculus of kidney: Secondary | ICD-10-CM

## 2021-08-31 DIAGNOSIS — K2270 Barrett's esophagus without dysplasia: Secondary | ICD-10-CM

## 2021-08-31 DIAGNOSIS — E3120 Multiple endocrine neoplasia [MEN] syndrome, unspecified: Secondary | ICD-10-CM

## 2021-08-31 DIAGNOSIS — K3184 Gastroparesis: Secondary | ICD-10-CM

## 2021-08-31 DIAGNOSIS — K319 Disease of stomach and duodenum, unspecified: Secondary | ICD-10-CM

## 2021-08-31 DIAGNOSIS — A498 Other bacterial infections of unspecified site: Secondary | ICD-10-CM

## 2021-08-31 DIAGNOSIS — Z09 Encounter for follow-up examination after completed treatment for conditions other than malignant neoplasm: Secondary | ICD-10-CM

## 2021-08-31 DIAGNOSIS — E041 Nontoxic single thyroid nodule: Secondary | ICD-10-CM

## 2021-08-31 DIAGNOSIS — N62 Hypertrophy of breast: Secondary | ICD-10-CM

## 2021-08-31 DIAGNOSIS — N390 Urinary tract infection, site not specified: Secondary | ICD-10-CM

## 2021-08-31 DIAGNOSIS — E785 Hyperlipidemia, unspecified: Secondary | ICD-10-CM

## 2021-08-31 DIAGNOSIS — D539 Nutritional anemia, unspecified: Secondary | ICD-10-CM

## 2021-08-31 DIAGNOSIS — K219 Gastro-esophageal reflux disease without esophagitis: Secondary | ICD-10-CM

## 2021-08-31 DIAGNOSIS — G629 Polyneuropathy, unspecified: Secondary | ICD-10-CM

## 2021-08-31 DIAGNOSIS — Z9889 Other specified postprocedural states: Secondary | ICD-10-CM

## 2021-08-31 DIAGNOSIS — C73 Malignant neoplasm of thyroid gland: Secondary | ICD-10-CM

## 2021-08-31 DIAGNOSIS — I10 Essential (primary) hypertension: Secondary | ICD-10-CM

## 2021-08-31 DIAGNOSIS — M1990 Unspecified osteoarthritis, unspecified site: Secondary | ICD-10-CM

## 2021-09-13 ENCOUNTER — Encounter: Admit: 2021-09-13 | Discharge: 2021-09-13 | Payer: MEDICARE

## 2021-09-14 ENCOUNTER — Encounter: Admit: 2021-09-14 | Discharge: 2021-09-14 | Payer: MEDICARE

## 2021-09-19 ENCOUNTER — Encounter: Admit: 2021-09-19 | Discharge: 2021-09-19 | Payer: MEDICARE

## 2021-09-25 ENCOUNTER — Encounter: Admit: 2021-09-25 | Discharge: 2021-09-25 | Payer: MEDICARE

## 2021-09-25 DIAGNOSIS — N390 Urinary tract infection, site not specified: Secondary | ICD-10-CM

## 2021-09-25 NOTE — Telephone Encounter
Patient's wife called with concerns regarding patient's nephrostomy and concerns that he may have an infection. Returned patient's wife's call. She reports that patient has had "white gunk" in his drainage bag for the last 3-4 days and right side kidney pain, which is opposite from his nephrostomy tube. She reports there is a strong odor and he is afebrile. Discussed due to his history, we could obtain a urine culture from his nephrostomy to rule out infection or we could watch how things go until his appt this week since we do not typically send a urine culture off odor and appearance. Agreed to fax urine culture order to patient's facility with plans to obtain from nephrostomy tube. Advised if patient's condition worsens prior to his appt he would need to seek further evaluation. Wife v/u and appreciated the call back. Wife provided this RN with [facility] phone number.       [facility] to obtain fax number for urine culture.   Fax: [phone]

## 2021-09-27 ENCOUNTER — Encounter: Admit: 2021-09-27 | Discharge: 2021-09-27 | Payer: MEDICARE

## 2021-09-27 NOTE — Telephone Encounter
Contacted pt's wife and after confirming pt name/DOB, discussed having 6 week post-op labs drawn when they come for urology appt on 09/29/21. Pt's wife verbalizes understanding. Denies further needs/questions.

## 2021-09-29 ENCOUNTER — Encounter: Admit: 2021-09-29 | Discharge: 2021-09-29 | Payer: MEDICARE

## 2021-09-29 ENCOUNTER — Ambulatory Visit: Admit: 2021-09-29 | Discharge: 2021-09-30 | Payer: MEDICARE

## 2021-09-29 VITALS — BP 120/69 | HR 82 | Temp 97.30000°F

## 2021-09-29 DIAGNOSIS — C73 Malignant neoplasm of thyroid gland: Secondary | ICD-10-CM

## 2021-09-29 DIAGNOSIS — E041 Nontoxic single thyroid nodule: Secondary | ICD-10-CM

## 2021-09-29 DIAGNOSIS — Z01818 Encounter for other preprocedural examination: Secondary | ICD-10-CM

## 2021-09-29 DIAGNOSIS — E785 Hyperlipidemia, unspecified: Secondary | ICD-10-CM

## 2021-09-29 DIAGNOSIS — N319 Neuromuscular dysfunction of bladder, unspecified: Secondary | ICD-10-CM

## 2021-09-29 DIAGNOSIS — M545 Chronic lower back pain: Secondary | ICD-10-CM

## 2021-09-29 DIAGNOSIS — R519 Generalized headaches: Secondary | ICD-10-CM

## 2021-09-29 DIAGNOSIS — W19XXXA Unspecified fall, initial encounter: Secondary | ICD-10-CM

## 2021-09-29 DIAGNOSIS — C4490 Unspecified malignant neoplasm of skin, unspecified: Secondary | ICD-10-CM

## 2021-09-29 DIAGNOSIS — K219 Gastro-esophageal reflux disease without esophagitis: Secondary | ICD-10-CM

## 2021-09-29 DIAGNOSIS — D539 Nutritional anemia, unspecified: Secondary | ICD-10-CM

## 2021-09-29 DIAGNOSIS — F419 Anxiety disorder, unspecified: Secondary | ICD-10-CM

## 2021-09-29 DIAGNOSIS — I10 Essential (primary) hypertension: Secondary | ICD-10-CM

## 2021-09-29 DIAGNOSIS — M1990 Unspecified osteoarthritis, unspecified site: Secondary | ICD-10-CM

## 2021-09-29 DIAGNOSIS — K319 Disease of stomach and duodenum, unspecified: Secondary | ICD-10-CM

## 2021-09-29 DIAGNOSIS — IMO0002 Squamous cell carcinoma: Secondary | ICD-10-CM

## 2021-09-29 DIAGNOSIS — N400 Enlarged prostate without lower urinary tract symptoms: Secondary | ICD-10-CM

## 2021-09-29 DIAGNOSIS — N390 Urinary tract infection, site not specified: Secondary | ICD-10-CM

## 2021-09-29 DIAGNOSIS — Z9889 Other specified postprocedural states: Secondary | ICD-10-CM

## 2021-09-29 DIAGNOSIS — R2689 Other abnormalities of gait and mobility: Secondary | ICD-10-CM

## 2021-09-29 DIAGNOSIS — S3710XD Unspecified injury of ureter, subsequent encounter: Secondary | ICD-10-CM

## 2021-09-29 DIAGNOSIS — K5732 Diverticulitis of large intestine without perforation or abscess without bleeding: Secondary | ICD-10-CM

## 2021-09-29 DIAGNOSIS — N189 Chronic kidney disease, unspecified: Secondary | ICD-10-CM

## 2021-09-29 DIAGNOSIS — M549 Dorsalgia, unspecified: Secondary | ICD-10-CM

## 2021-09-29 DIAGNOSIS — K3184 Gastroparesis: Secondary | ICD-10-CM

## 2021-09-29 DIAGNOSIS — E3120 Multiple endocrine neoplasia [MEN] syndrome, unspecified: Secondary | ICD-10-CM

## 2021-09-29 DIAGNOSIS — E039 Hypothyroidism, unspecified: Secondary | ICD-10-CM

## 2021-09-29 DIAGNOSIS — K2270 Barrett's esophagus without dysplasia: Secondary | ICD-10-CM

## 2021-09-29 DIAGNOSIS — N200 Calculus of kidney: Secondary | ICD-10-CM

## 2021-09-29 DIAGNOSIS — B999 Unspecified infectious disease: Secondary | ICD-10-CM

## 2021-09-29 DIAGNOSIS — Z8719 Personal history of other diseases of the digestive system: Secondary | ICD-10-CM

## 2021-09-29 DIAGNOSIS — N62 Hypertrophy of breast: Secondary | ICD-10-CM

## 2021-09-29 DIAGNOSIS — E119 Type 2 diabetes mellitus without complications: Secondary | ICD-10-CM

## 2021-09-29 DIAGNOSIS — N529 Male erectile dysfunction, unspecified: Secondary | ICD-10-CM

## 2021-09-29 DIAGNOSIS — A498 Other bacterial infections of unspecified site: Secondary | ICD-10-CM

## 2021-09-29 DIAGNOSIS — G629 Polyneuropathy, unspecified: Secondary | ICD-10-CM

## 2021-09-29 NOTE — Progress Notes
Date of Service: 09/29/2021     Subjective:            I have reviewed this patients current past medical history, surgical history, family history, social history, allergies, and medications.    History of Present Illness    Jimmy Waters is a 66 y.o. male with obesity, HTN, HLD, DMII on insulin (A1C 7.2%), metastatic thyroid cancer, hypothyroidism, GERD, arthritis, depression and BPH with urinary retention and recurrent urosepsis, previously managed with a Foley catheter, recent RIGHT ureteral injury s/p nephroureteral drain further complicated by a subcapsular hematoma, now preadmitted to medicine with concern for a complicated UTI ahead of his scheduled 5/4 neck dissection for thyroid cancer.  ?  He has a complicated recent urological history with [facility], summarized below:    He has a history of BPH for which he previously managed with [facility] however he developed infections that he described as hematuria and dysuria after catheterization. He had a catheter placed in November 2022 that was changed every 4-6 weeks he was planning on undergoing aquablation with Dr. Willaim Bane in late 2022 though had trouble getting to his surgical date due to recurrent urosepsis. In 02/2021 he was noted to have RIGHT sided pain with hydronephrosis to the distal ureter on CT imaging though artifact from his bilateral hip replacements obscured visualization of the distal ureter. He subsequently underwent ureteroscopy of the distal ureter and retrograde pyelogram on 03/15/21 which did not demonstrate a calculus. A 6 Fr x 26 cm ureteral stent was subsequently inserted.     He was later rescheduled for R ureteroscopy, aquablation and SPT insertion with Dr. Willaim Bane on 05/18/21 for BPH, urinary retention and possible kidney stone. He later presented on 05/12/21 to the ED with RLQ abdominal pain and Foley catheter balloon inflated in the distal RIGHT ureter. The Foley was re-positioned and follow-up cystogram demonstrated a small volume bladder with contrast extravasation from the ureter at the previous site of the Foley balloon. A RIGHT nephrostomy tube was inserted for urinary diversion. This admission was complicated by MRSA UTI and bacteremia and at some point his Foley was removed. His R PNCT and ureteral stent was converted to a PCNU on 3/28 which was complicated by an 8.3 cm subcapsular hematoma, AKI and MDR E. Coli urosepsis. He ultimately discharged to inpatient rehab.  ?  Most recently, an outpatient UA attained ahead of his upcoming thyroid cancer surgery was consistent with UTI and he was subsequently directed to the [facility] ED where he was preadmitted to medicine. His only symptom at the time of the UA collection was gross hematuria. He denied any additional LUTS, fever/chills, pain or nausea/vomiting. For the past few months he mostly voids via incontinence. CT AP w/ contrast demonstrates a non-enhancing, 0.8 cm RIGHT subcapsular fluid collection which communicates with a linear hypodensity in the anterior lower pole. His RIGHT PCNU is in good position and there is no hydronephrosis. There is mild thickening of the distal LEFT ureter. Evaluation of his pelvis is limited from artificat from his bilateral hip replacements.    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  He also endorses having an exploratory laparotomy done at some point in the past due to concern for colovesicular fistula which was ultimately negative.    INTERVAL HISTORY:    Jimmy Waters is a 65 y.o. male with complicated urologic history including BPH with small capacity  bladder and recent trauma to right ureteral orifice with Foley balloon placement into the right UO s/p R PCNU placement on 06/20/21 c/b urosepsis and subcapsular hematoma. He presents today for further management.     He suffered a ground-level fall and May 2023 and underwent right lower extremity ORIF if this periprosthetic fracture on 08/13/2021.  He does have a history of multidrug-resistant urinary tract infections with his right-sided nephrostomy tube, infectious disease was consulted during his inpatient rehabilitation stay due to multidrug-resistant Pseudomonas for which he completed a course of meropenem on 08/20/2021.    His right percutaneous nephroureteral stent was exchanged on 07/25/2021 which was complicated by right inferior artery bleed which was coil embolized at that time as well as multiple right renal artery branches.    Recently wife called due to some white sediment in right PCNU bag     Review of Systems:  Negative except as above     Medical History:   Diagnosis Date   ? Anxiety disorder    ? Back pain    ? Barrett esophagus    ? BPH (benign prostatic hyperplasia)    ? Cancer of skin    ? Cataract    ? Chronic kidney disease unknown   ? Chronic lower back pain    ? Chronic UTI    ? Degenerative arthritis    ? Depression (disease)    ? Diverticulitis of colon (without mention of hemorrhage)(562.11)     type 2   ? DJD (degenerative joint disease)    ? DM type 2 (diabetes mellitus, type 2) (HCC)    ? Dyslipidemia    ? Erectile dysfunction 2021   ? Falls 2022   ? Gastroparesis    ? Generalized headaches 2022   ? GERD (gastroesophageal reflux disease)    ? Gynecomastia    ? History of dental problems     lower partial   ? History of Nissen fundoplication ~1990   ? HLD (hyperlipidemia)    ? HTN (hypertension)    ? Hx of arthroscopy of knee    ? Hypothyroidism    ? Infection    ? Kidney stones    ? 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 Neuropathy    ? Osteoarthritis    ? Poor balance 2022   ? Shiga toxin-producing Escherichia coli infection    ? Skin cancer    ? Squamous cell carcinoma    ? Stomach disorder    ? Thyroid cancer (HCC) 2009   ? Thyroid nodule 2009, 2017, 2022   ? Unspecified deficiency anemia 2022     Surgical History:   Procedure Laterality Date   ? HX LUMBAR FUSION  1982    L4-5   ? HX THYROIDECTOMY  09/2007   ? CYSTOURETHROSCOPY  2012   ? KIDNEY STONE SURGERY  2012   ? COLONOSCOPY  02/14/2011   ? HX APPENDECTOMY  02/2011   ? REPEAT LEFT LUMBAR 5 TO SACRAL 1 LAMINOTOMY N/A 01/02/2019    Performed by Karie Chimera, MD at [facility] OR   ? INCISION AND DRAINAGE POSTOPERATIVE WOUND INFECTION - COMPLEX N/A 01/20/2019    Performed by Karie Chimera, MD at [facility] OR   ? NEPHROSTOMY Right 04/2021    in place from right ureter from OSH   ? Revision right neck dissection Right 07/27/2021    Performed by Marlowe Aschoff, MD at [facility] OR   ?  FLEXIBLE LARYNGOSCOPY DIAGNOSTIC N/A 07/27/2021    Performed by Marlowe Aschoff, MD at [facility] OR   ? OPEN TREATMENT FEMORAL SHAFT FRACTURE WITH PLATE/ SCREWS WITH/ WITHOUT CERCLAGE Right 08/13/2021    Performed by Rayford Halsted, MD at [facility] OR   ? CATARACT REMOVAL WITH IMPLANT Bilateral    ? CYSTOSCOPY     ? HX HIP REPLACEMENT Bilateral     revision of 1 hip also   ? HX KNEE ARTHROSCOPY     ? HX KNEE REPLACMENT Bilateral    ? HX LAPAROTOMY      possible colon fistula   ? HX LYMPHADENECTOMY     ? HX ROTATOR CUFF REPAIR Bilateral     [id number]   ? HX SHOULDER REPLACEMENT Bilateral    ? HX TONSILLECTOMY     ? HX VASECTOMY     ? PARATHYROID GLAND SURGERY  2009   ? THYROIDECTOMY     ? UPPER GASTROINTESTINAL ENDOSCOPY         Objective:         Current Outpatient Medications   Medication Instructions   ? acetaminophen (TYLENOL) 650 mg, Oral, EVERY  6 HOURS PRN   ? aspirin EC (ASPIR-LOW) 81 mg, Oral, DAILY, Continue to hold until instructed to resume in clinic.   ? atorvastatin (LIPITOR) 20 mg, Oral, DAILY   ? blood-glucose meter,continuous (DEXCOM G6 RECEIVER MISC)   Dexcom G6 Receiver, See Instructions, 1 EA, Use as directed., 0 Number of Refills, 0, Route to Pharmacy Electronically, [facility], [facility] (New Address), Instructions Replace Required Details, NCPDP_[id number], 185, cm, 04/25/20 11:38:00 CST, Height   ? blood-glucose sensor (DEXCOM G6 SENSOR MISC)   Dexcom G6 Sensor, See Instructions, 9 EA, Use as directed.  Change every 10 days., 3 Number of Refills, 3, Route to Pharmacy Electronically, [facility], [facility] (New Address), Instructions Replace Required Details, NCPDP_[id number], 185, cm, 04/25/20...   ? blood-glucose transmitter (DEXCOM G6 TRANSMITTER MISC)   Dexcom G6 Transmitter, See Instructions, 1 EA, Use as directed., 3 Number of Refills, 3, Route to Pharmacy Electronically, [facility], [facility] (New Address), Instructions Replace Required Details, NCPDP_[id number], 185, cm, 04/25/20 11:38:00 CST, Height   ? buPROPion HCL SR (WELLBUTRIN SR) 150 mg, Oral, TWICE DAILY   ? dutasteride (AVODART) 0.5 mg, Oral, AT BEDTIME DAILY   ? heparin (porcine) PF 5,000 Units, Subcutaneous, EVERY  8 HOURS   ? insulin glargine (LANTUS SOLOSTAR U-100 INSULIN) 15 Units, Subcutaneous, AT BEDTIME DAILY   ? levothyroxine (SYNTHROID) 300 mcg, Oral, DAILY BEFORE BREAKFAST   ? levothyroxine (SYNTHROID) 25 mcg, Oral, DAILY BEFORE BREAKFAST, Take in addition to 300 mcg to make total 325 mcg daily.   ? losartan (COZAAR) 12.5 mg, Oral, DAILY   ? melatonin 5 mg, Oral, AT BEDTIME DAILY   ? metoprolol tartrate (LOPRESSOR) 50 mg, Oral, TWICE DAILY   ? MULTIVITAMIN PO 1 tablet, Oral, DAILY   ? ondansetron (ZOFRAN ODT) 8 mg, Oral, EVERY  8 HOURS PRN   ? oxyCODONE 10 mg, Oral, EVERY  6 HOURS   ? polyethylene glycol 3350 (MIRALAX) 17 g, Oral, DAILY  PRN   ? senna (SENOKOT) 8.6 mg tablet 1 tablet, Oral, TWICE DAILY PRN   ? sertraline (ZOLOFT) 100 mg, Oral, DAILY   ? tamsulosin (FLOMAX) 0.4 mg, Oral, AT BEDTIME DAILY, Do not crush, chew or open capsules. Take 30 minutes following the same meal each day.   ? tiZANidine (ZANAFLEX) 2 mg tablet Take two tablets by mouth  at bedtime daily. May also take one tablet three times daily as needed.   ? triamcinolone acetonide (KENALOG) 0.025 % topical cream Topical, DAILY  PRN     Vitals:    09/29/21 1411   BP: 120/69   Pulse: 82   Temp: 36.3 ?C (97.3 ?F) Physical Exam:     General: Thin, frail male in NAD. In WC   HEENT: normocephalic and atraumatic  Respiratory: Equal chest rise bilaterally, non labored respirations  GI: soft, nontender, nondistended, no organomegaly. Midline exploratory laparotomy scar  GU: no CVA tenderness,   Neuro: grossly intact moves all extremities  Psych: normal behavior, normal cognition  Extremities: no edema, no cyanosis  Skin: warm, dry, intact    IMAGING:    CT CAP W/ 08/12/21:     Right PCNU in place. High density material  within the right renal collecting system. Gas within the right renal collecting system, likely due to indwelling stent. Subcapsular hematoma of  the posterior right kidney measures up to 0.7 cm (series 2 image 155).  Hypoenhancement of the inferior portion of the right kidney. No discrete  right renal laceration is identified. Left renal cysts. No evidence of  acute traumatic injury to the left kidney. No extravasation of contrast material from the opacified portions of the collecting systems and ureters bilaterally.            Assessment and Plan:    Jimmy Waters is a 66 y.o. male with complicated urologic history including BPH with small capacity bladder and recent trauma to right ureteral orifice with Foley balloon placement into the right UO s/p R PCNU placement on 06/20/21 c/b urosepsis and subcapsular hematoma. He presents today for further management.     Patient with complicated urological history is previously followed at [facility], he endorses a history of BPH, he did have urodynamic studies done in 2021 around the time of his hip replacement for which he was told that he had a end-stage bladder.  He was previously managed with intermittent catheterization for several months and 2022 and reported catheterizing 4 times a day however this was ultimately stopped as he was suffering infections which she describes as pain with catheterization and hematuria associated with his catheterizations. Given these infections indwelling Foley catheter was placed in November 2022 and was changed in the office every 4 to 6 weeks.    In February 2023 his Foley catheter was exchanged with the balloon inflated in the right ureteral orifice resulting in an episode of urosepsis.  He was ultimately admitted to the hospital and underwent right percutaneous nephroureteral stent placement.    His nephroureteral stent has been in place since then, he did suffer a periprosthetic hip fracture requiring a plate to be placed in his femur for which she was in acute rehab and ultimately skilled nursing facility which she is actually leaving today to go home.  He has not been cleared by his orthopedist at this time for lithotomy positioning and plans on seeing his orthopedist on 10/10/2021.    Given his complex history we discussed what limited options he has left, ultimately do not have a confirmed diagnosis but he likely does have some degree of end-stage bladder resultant from his BPH and poorly controlled diabetes however we do not have his urodynamic results.  It is unclear if he suffered chronic infections from his clean remittent catheterizations but he is not agreeable to return this management strategy.  Given the recent trauma caused by urethral  catheter family is hesitant to return to his management strategy as well.  We did discuss placement of a suprapubic catheter which they are agreeable to and have been discussing with urologist in the past.  We reviewed that this ultimately will not improve his infections but may allow his bladder to drain.  Ideally this will prevent cannulating his likely below ureteral orifices as well.    We also discussed removal of his right percutaneous nephroureteral stent, ultimately we would plan on doing this in the operating room at the time of cystoscopy with suprapubic catheter placement however given his inability to tolerate the lithotomy position given his recent hip fracture we likely will need to have this done with interventional radiology.  We would ask them to perform an antegrade study to confirm no ureteral extravasation and the ureteral injury has healed, should this be the case they can remove the nephroureteral stent.  If there is persistent injury or extravasation we would ask interventional radiology to place an indwelling nephroureteral stent internally which urology will manage in the future as well as a suprapubic catheter.    We will see how he does with a suprapubic catheter placed by interventional radiology and ultimately he may need repeat urodynamic studies with a possible simple cystectomy however given his poor protoplasm would not proceed with this at this time.    PLAN:  - Defer abx at this time given asymptomatic and MDR UTIs  - Referral to IR for RIGHT antegrade PCNU interrogation, if antegrade evaluation negative for ureteral injury can remove PCNU, if + for extrav or injury internalize stent  - Refer to interventional radiology for suprapubic catheter placement given inability to tolerate lithotomy positioning   - He will discuss lithotomy positioning with orthopedist on 10/10/21  - Follow up in 1 months for SPT change after placement with IR  - Ultimately may need repeat UDS and simple cystectomy        Orlene Och, MD  Urologic Oncology Fellow

## 2021-09-30 DIAGNOSIS — S3710XD Unspecified injury of ureter, subsequent encounter: Principal | ICD-10-CM

## 2021-10-02 ENCOUNTER — Encounter: Admit: 2021-10-02 | Discharge: 2021-10-02 | Payer: MEDICARE

## 2021-10-02 NOTE — Progress Notes
Right femur x-rays from 09/18/2021 requested to be clouded over from [place] (ph. [phone]).

## 2021-10-02 NOTE — Progress Notes
Interventional Radiology Outpatient Procedure Review Assessment        Reason for consult: Evaluate for nephrostomy tube exchange and pelvic drain placement in setting of ureteral injury .      History of present illness:  Jimmy Waters is a 66 y.o. male patient with a history significant for urinary retention, R ureteral injury s/p nephroureteral drain c/b subcapsular hematoma. Needs nephrostomy tube check and suprapubic cather placement. Urology would like antegrade pyelogram to evaluate for persistent extravasation and suprapubic catheter placement      Assessment:   - Review of imaging: CT 08/12/21.   - Review of recent labs and allergies:   Allergies   Allergen Reactions    Ambien [Zolpidem] HALLUCINATIONS    Statins-Hmg-Coa Reductase Inhibitors MUSCLE PAIN     Tolerates low dose atorvastatin    Ambien [Zolpidem] AGITATION     - Review of anticoagulation medications: NA  - Previous ASA score if available: 3 (If ASA IV, anesthesia will run sedation for procedure if sedation indicated).  - Previous IR Anesthetic/Sedation History (if applicable): NA  - Case discussed with referring Physician (Y/N): N  - Contraindications to procedure: NA      Plan:  - This case has been reviewed and approved for R PCN check, antegrade pyelogram, and suprapubic catheter placement  - Patient position and modality: supine, fluoro  - Intra-procedural Sedation/Medication Plan: Moderate sedation  - Procedure time frame: <10 days  - Anticoagulation: Per IR peri procedural anticoagulation management protocol  - Labs: Per IR pre procedural lab protocol  - Additional requests after review: NA    Lab/Radiology/Other Diagnostic Tests:  Labs:  Pertinent labs reviewed             Ivette Loyal, MD

## 2021-10-03 NOTE — Patient Education
Dear Jimmy Waters,    Thank you for choosing [facility] Interventional Radiology for your procedure. Your appointment information is listed below:    Appointment Date: 10-16-21  Appointment Time: 10am  Arrival Time: 9am  Location:   ? Main Campus: [address] [place], [place]  [address]  Parking: [address]    INTERVENTIONAL RADIOLOGY  PRE-PROCEDURE INSTRUCTIONS SEDATION    You are scheduled for a procedure in Interventional Radiology with procedural sedation.  Please follow these instructions and any direction from your Primary Care/Managing Physician.  If you have questions about your procedure or need to reschedule please call [phone].    Medication Instructions:   You may take the following medications with a small sip of water:  Continue all medications as prescribed before 8am. As for your Diabetic medication, please follow the guidelines established by your physician for when you are fasting.      Diet Instructions:  a. (8) hours 2am before your procedure, stop your regular diet and start a clear liquid diet.  b. (6) hours before your procedure, discontinue tube feedings and chewing tobacco.  c. (2) hours 8am before your procedure discontinue clear liquids.  You should have nothing by mouth. This includes GUM or CANDY.     Clear Liquid Diet    Water  Apple or White Grape Juice  Coffee or tea without cream   Tea  White Cranberry Juice  Chicken Bouillon or Broth (no noodles)  Soda Pop  Popsicles   Beef Bouillon or Broth (no noodles)    Day of Exam Instructions:  1. Bathe or shower with an antibacterial soap prior to your appointment.  2. If you have a history of Obstructive Sleep Apnea (OSA) bring your CPAP/BIPAP.   3. Bring a list of your current medications and the dosages.  4. Wear comfortable clothing and leave valuables at home.  5. Arrive (1) hour prior to your appointment.  This time will be spent registering, interviewing, assessing, educating and preparing you for the test.  ? You will be with [place] anywhere from 30 minutes to 6 hours after your exam depending on your procedure.  6. You will be sedated for the procedure. A responsible adult must drive you home (no Benedetto Goad, taxis or buses are allowed) and stay with you overnight. If you do not have a driver we will be unable to perform your procedure.   7. You will not be able to return to work or drive the same day if receiving sedation.

## 2021-10-03 NOTE — Progress Notes
Interventional Radiology Outpatient Scheduling Checklist      1.  Name of Procedure(s):   Pelvic drain placement and neph tube check  10/02/2021 11:17 AM CDT by Clide Dales, MD     Approved? Yes   Attending Provider Reviewing the Case: Rohr   Case Type: Body   Modality: Fluoroscopy   Position: Supine   Sedation Type: Moderate   Preferred Location: Any   Physician: Any   IR Assessment & plan note completed Yes         2.  Date of Procedure:   10-16-21      3.  Arrival Time:   900      4.  Procedure Time:  1000      5.  Correct Procedural Room Assignment:  [facility] IR 2      6.  Blood Thinners Triaged and instructed per protocol: Y/N/NA:  NA  Confirmed accurate instructions sent to patient: Y/N:  NA       7.  Procedure Order Verified: Y/N:  Yes      9.  Patient instructed to have a driver: Y/N/NA:  Yes    10.  Patient instructed on NPO status: Y/N/NA:  Yes  Confirmed accurate instructions sent to patient: Y/N:  Yes    11.  Specimen needed: Y/N/NA:  N/A  Verified Order placed: Y/N:  Yes    12.  Allergies Verified:  Y/N:  Yes    13.  Is there an Iodine Allergy: Y/N:  No  Does the Procedure Require contrast: Y/N:  Yes   If so, was the IR- Contrast Allergy Pre-Procedure Medication protocol ordered: Y/NA:  NA    14.  Does the patient have labs according to IR Pre-procedure Laboratory Parameter policy: Y/N/NA:  Yes  If No, was the patient instructed to obtain labs prior to procedure: Y/N/NA:  NA     15.  Will the patient need to be admitted or have a possible admission: Y/N:  No  If yes, confirmed accurate instructions sent to patient: Y/N/NA:  NA     16.  Patient States Understanding: Y/N:  Yes    17.  History of OSA:  Y/N:  No  If yes, confirm request to bring CPAP sent to patient: Y/N/NA:  NA    18. Does the patient have an insulin pump or continuous glucose monitor? Yes   If yes, was the patient instructed that this will need to be removed for this procedure and to bring supplies to reapply once the procedure is complete? Yes    19. Patient was sent electronic procedure instructions: Y/N:  Yes, mychart

## 2021-10-09 ENCOUNTER — Encounter: Admit: 2021-10-09 | Discharge: 2021-10-09 | Payer: MEDICARE

## 2021-10-09 DIAGNOSIS — Z09 Encounter for follow-up examination after completed treatment for conditions other than malignant neoplasm: Secondary | ICD-10-CM

## 2021-10-10 ENCOUNTER — Encounter: Admit: 2021-10-10 | Discharge: 2021-10-10 | Payer: MEDICARE

## 2021-10-10 ENCOUNTER — Ambulatory Visit: Admit: 2021-10-10 | Discharge: 2021-10-10 | Payer: MEDICARE

## 2021-10-10 DIAGNOSIS — E3120 Multiple endocrine neoplasia [MEN] syndrome, unspecified: Secondary | ICD-10-CM

## 2021-10-10 DIAGNOSIS — C73 Malignant neoplasm of thyroid gland: Secondary | ICD-10-CM

## 2021-10-10 DIAGNOSIS — Z8719 Personal history of other diseases of the digestive system: Secondary | ICD-10-CM

## 2021-10-10 DIAGNOSIS — N200 Calculus of kidney: Secondary | ICD-10-CM

## 2021-10-10 DIAGNOSIS — K319 Disease of stomach and duodenum, unspecified: Secondary | ICD-10-CM

## 2021-10-10 DIAGNOSIS — N390 Urinary tract infection, site not specified: Secondary | ICD-10-CM

## 2021-10-10 DIAGNOSIS — D539 Nutritional anemia, unspecified: Secondary | ICD-10-CM

## 2021-10-10 DIAGNOSIS — N529 Male erectile dysfunction, unspecified: Secondary | ICD-10-CM

## 2021-10-10 DIAGNOSIS — R519 Generalized headaches: Secondary | ICD-10-CM

## 2021-10-10 DIAGNOSIS — Z9889 Other specified postprocedural states: Secondary | ICD-10-CM

## 2021-10-10 DIAGNOSIS — G629 Polyneuropathy, unspecified: Secondary | ICD-10-CM

## 2021-10-10 DIAGNOSIS — N189 Chronic kidney disease, unspecified: Secondary | ICD-10-CM

## 2021-10-10 DIAGNOSIS — Z09 Encounter for follow-up examination after completed treatment for conditions other than malignant neoplasm: Secondary | ICD-10-CM

## 2021-10-10 DIAGNOSIS — W19XXXA Unspecified fall, initial encounter: Secondary | ICD-10-CM

## 2021-10-10 DIAGNOSIS — E041 Nontoxic single thyroid nodule: Secondary | ICD-10-CM

## 2021-10-10 DIAGNOSIS — K219 Gastro-esophageal reflux disease without esophagitis: Secondary | ICD-10-CM

## 2021-10-10 DIAGNOSIS — M549 Dorsalgia, unspecified: Secondary | ICD-10-CM

## 2021-10-10 DIAGNOSIS — B999 Unspecified infectious disease: Secondary | ICD-10-CM

## 2021-10-10 DIAGNOSIS — M545 Chronic lower back pain: Secondary | ICD-10-CM

## 2021-10-10 DIAGNOSIS — N400 Enlarged prostate without lower urinary tract symptoms: Secondary | ICD-10-CM

## 2021-10-10 DIAGNOSIS — I10 Essential (primary) hypertension: Secondary | ICD-10-CM

## 2021-10-10 DIAGNOSIS — E785 Hyperlipidemia, unspecified: Secondary | ICD-10-CM

## 2021-10-10 DIAGNOSIS — R2689 Other abnormalities of gait and mobility: Secondary | ICD-10-CM

## 2021-10-10 DIAGNOSIS — M1990 Unspecified osteoarthritis, unspecified site: Secondary | ICD-10-CM

## 2021-10-10 DIAGNOSIS — C4490 Unspecified malignant neoplasm of skin, unspecified: Secondary | ICD-10-CM

## 2021-10-10 DIAGNOSIS — E039 Hypothyroidism, unspecified: Secondary | ICD-10-CM

## 2021-10-10 DIAGNOSIS — N62 Hypertrophy of breast: Secondary | ICD-10-CM

## 2021-10-10 DIAGNOSIS — K5732 Diverticulitis of large intestine without perforation or abscess without bleeding: Secondary | ICD-10-CM

## 2021-10-10 DIAGNOSIS — E119 Type 2 diabetes mellitus without complications: Secondary | ICD-10-CM

## 2021-10-10 DIAGNOSIS — K2270 Barrett's esophagus without dysplasia: Secondary | ICD-10-CM

## 2021-10-10 DIAGNOSIS — IMO0002 Squamous cell carcinoma: Secondary | ICD-10-CM

## 2021-10-10 DIAGNOSIS — K3184 Gastroparesis: Secondary | ICD-10-CM

## 2021-10-10 DIAGNOSIS — A498 Other bacterial infections of unspecified site: Secondary | ICD-10-CM

## 2021-10-10 DIAGNOSIS — F419 Anxiety disorder, unspecified: Secondary | ICD-10-CM

## 2021-10-16 ENCOUNTER — Encounter: Admit: 2021-10-16 | Discharge: 2021-10-16 | Payer: MEDICARE

## 2021-10-16 ENCOUNTER — Ambulatory Visit: Admit: 2021-10-16 | Discharge: 2021-10-16 | Payer: MEDICARE

## 2021-10-16 DIAGNOSIS — S3710XD Unspecified injury of ureter, subsequent encounter: Secondary | ICD-10-CM

## 2021-10-16 DIAGNOSIS — N319 Neuromuscular dysfunction of bladder, unspecified: Secondary | ICD-10-CM

## 2021-10-16 DIAGNOSIS — Z01818 Encounter for other preprocedural examination: Secondary | ICD-10-CM

## 2021-10-16 LAB — POC GLUCOSE: POC GLUCOSE: 145 mg/dL — ABNORMAL HIGH (ref 70–100)

## 2021-10-16 MED ORDER — MIDAZOLAM 1 MG/ML IJ SOLN
0 refills | Status: CP
Start: 2021-10-16 — End: ?
  Administered 2021-10-16 (×2): 1 mg via INTRAVENOUS

## 2021-10-16 MED ORDER — SODIUM CHLORIDE 0.9 % IV SOLP
0 refills | Status: CP
Start: 2021-10-16 — End: ?
  Administered 2021-10-16: 15:00:00 500 mL via INTRAVENOUS

## 2021-10-16 MED ORDER — FENTANYL CITRATE (PF) 50 MCG/ML IJ SOLN
0 refills | Status: CP
Start: 2021-10-16 — End: ?
  Administered 2021-10-16: 16:00:00 25 ug via INTRAVENOUS
  Administered 2021-10-16 (×2): 50 ug via INTRAVENOUS

## 2021-10-16 MED ORDER — NALOXONE 0.4 MG/ML IJ SOLN
.08 mg | INTRAVENOUS | 0 refills | Status: DC | PRN
Start: 2021-10-16 — End: 2021-10-16

## 2021-10-16 MED ORDER — CEFTRIAXONE INJ 1GM IVP
1 g | Freq: Once | INTRAVENOUS | 0 refills | Status: CP
Start: 2021-10-16 — End: ?
  Administered 2021-10-16: 15:00:00 1 g via INTRAVENOUS

## 2021-10-16 MED ORDER — FLUMAZENIL 0.1 MG/ML IV SOLN
.2 mg | INTRAVENOUS | 0 refills | Status: DC | PRN
Start: 2021-10-16 — End: 2021-10-16

## 2021-10-16 MED ORDER — IOHEXOL 300 MG IODINE/ML IV SOLN
25 mL | Freq: Once | 0 refills | Status: CP
Start: 2021-10-16 — End: ?
  Administered 2021-10-16: 16:00:00 25 mL

## 2021-10-16 MED ORDER — SODIUM CHLORIDE 0.9 % IV SOLP
1000 mL | Freq: Once | INTRAVENOUS | 0 refills | Status: DC
Start: 2021-10-16 — End: 2021-10-16

## 2021-10-16 MED ORDER — MIDAZOLAM 1 MG/ML IJ SOLN
1 mg | Freq: Once | INTRAVENOUS | 0 refills | Status: CP
Start: 2021-10-16 — End: ?
  Administered 2021-10-16: 15:00:00 1 mg via INTRAVENOUS

## 2021-10-16 NOTE — Progress Notes
Brief Urology Update Note    Patient underwent successful removal of R PCNU by IR today, however unable to have SPT placed secondary to low-capacity bladder. IR placed urethral foley with appropriate positioning noted on fluoroscopy.     Patient is scheduled to be seen by Dr. Debria Garret as outpatient on 12/01/21. Please maintain foley catheter until this appointment given prior traumatic catheter placement. Urology will perform foley catheter exchange at that time and discuss potential urinary diversion. Communicated to IR staff.     Linna Hoff

## 2021-10-16 NOTE — H&P (View-Only)
Pre Procedure History and Physical/Sedation Plan-OP    Procedure Date: 10/16/2021     Planned Procedure(s):  Nephroureterogram with possible drain exchange, drain exchange     Indication for exam:  Urine leak, diagnostic study  __________________________________________________________________    Chief Complaint:  Urine leak    History of Present Illness: Jimmy Waters is a 66 y.o. male.    Patient Active Problem List    Diagnosis Date Noted   ? Hypothyroid 08/22/2021   ? Nephrostomy status (HCC) 08/22/2021   ? History of femur fracture 08/22/2021   ? HTN (hypertension) 08/14/2021   ? HLD (hyperlipidemia) 08/14/2021   ? DM2 (diabetes mellitus, type 2) (HCC) 08/14/2021   ? Urethral injury 08/14/2021   ? Fall 08/14/2021   ? Pain, acute due to trauma 08/14/2021   ? Impaired mobility and activities of daily living 08/14/2021   ? Acute blood loss anemia 08/14/2021   ? Peri-prosthetic femoral shaft fracture 08/12/2021   ? Benign prostatic hyperplasia with urinary obstruction 07/24/2021   ? Bacteremia due to methicillin resistant Staphylococcus aureus 07/24/2021   ? Complicated UTI (urinary tract infection) 07/23/2021   ? Postoperative infection, initial encounter 01/20/2019   ? Lumbar stenosis with neurogenic claudication 01/02/2019   ? Lumbar radiculopathy 01/02/2019   ? Gastroesophageal reflux disease 05/06/2016   ? Chronic low back pain 05/06/2016   ? Dry mouth 12/01/2015   ? Postural dizziness with presyncope 12/01/2015   ? Medication side effects 10/06/2015   ? Pure hypercholesterolemia 10/06/2015   ? Agatston coronary artery calcium score greater than 400 09/14/2015   ? Obesity (BMI 30.0-34.9) 10/22/2014   ? Autonomic instability 09/20/2014   ? Chronic neck pain 05/02/2014   ? Low back pain with sciatica 05/02/2014   ? Numbness in both hands 05/02/2014   ? Insomnia 11/06/2013   ? Nephrolithiasis 05/13/2013   ? Balanitis 09/16/2012   ? Lower urinary tract infectious disease 08/19/2012   ? 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 E coli enteritis 12/23/2010   ? DM (diabetes mellitus) (HCC) 12/23/2010   ? HTN (hypertension) 12/23/2010   ? Hyperlipidemia 12/23/2010   ? Hypothyroidism 12/23/2010     Medical History:   Diagnosis Date   ? Anxiety disorder    ? Back pain    ? Barrett esophagus    ? BPH (benign prostatic hyperplasia)    ? Cancer of skin    ? Cataract    ? Chronic kidney disease unknown   ? Chronic lower back pain    ? Chronic UTI    ? Degenerative arthritis    ? Depression (disease)    ? Diverticulitis of colon (without mention of hemorrhage)(562.11)     type 2   ? DJD (degenerative joint disease)    ? DM type 2 (diabetes mellitus, type 2) (HCC)    ? Dyslipidemia    ? Erectile dysfunction 2021   ? Falls 2022   ? Gastroparesis    ? Generalized headaches 2022   ? GERD (gastroesophageal reflux disease)    ? Gynecomastia    ? History of dental problems     lower partial   ? History of Nissen fundoplication ~1990   ? HLD (hyperlipidemia)    ? HTN (hypertension)    ? Hx of arthroscopy of knee    ? Hypothyroidism    ? Infection    ? Kidney stones    ? 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 Neuropathy    ? Osteoarthritis    ? Poor balance 2022   ? Shiga toxin-producing Escherichia coli infection    ? Skin cancer    ? Squamous cell carcinoma    ? Stomach disorder    ? Thyroid cancer (HCC) 2009   ? Thyroid nodule 2009, 2017, 2022   ? Unspecified deficiency anemia 2022      Surgical History:   Procedure Laterality Date   ? HX LUMBAR FUSION  1982    L4-5   ? HX THYROIDECTOMY  09/2007   ? CYSTOURETHROSCOPY  2012   ? KIDNEY STONE SURGERY  2012   ? COLONOSCOPY  02/14/2011   ? HX APPENDECTOMY  02/2011   ? REPEAT LEFT LUMBAR 5 TO SACRAL 1 LAMINOTOMY N/A 01/02/2019    Performed by Karie Chimera, MD at [facility] OR   ? INCISION AND DRAINAGE POSTOPERATIVE WOUND INFECTION - COMPLEX N/A 01/20/2019    Performed by Karie Chimera, MD at [facility] OR   ? NEPHROSTOMY Right 04/2021    in place from right ureter from OSH   ? Revision right neck dissection Right 07/27/2021    Performed by Marlowe Aschoff, MD at [facility] OR   ? FLEXIBLE LARYNGOSCOPY DIAGNOSTIC N/A 07/27/2021    Performed by Marlowe Aschoff, MD at [facility] OR   ? OPEN TREATMENT FEMORAL SHAFT FRACTURE WITH PLATE/ SCREWS WITH/ WITHOUT CERCLAGE Right 08/13/2021    Performed by Rayford Halsted, MD at [facility] OR   ? CATARACT REMOVAL WITH IMPLANT Bilateral    ? CYSTOSCOPY     ? HX HIP REPLACEMENT Bilateral     revision of 1 hip also   ? HX KNEE ARTHROSCOPY     ? HX KNEE REPLACMENT Bilateral    ? HX LAPAROTOMY      possible colon fistula   ? HX LYMPHADENECTOMY     ? HX ROTATOR CUFF REPAIR Bilateral     [id number]   ? HX SHOULDER REPLACEMENT Bilateral    ? HX TONSILLECTOMY     ? HX VASECTOMY     ? PARATHYROID GLAND SURGERY  2009   ? THYROIDECTOMY     ? UPPER GASTROINTESTINAL ENDOSCOPY        Medications Prior to Admission   Medication Sig Dispense Refill Last Dose   ? acetaminophen (TYLENOL) 325 mg tablet Take two tablets by mouth every 6 hours as needed for Pain.   10/15/2021   ? aspirin EC 81 mg tablet Take one tablet by mouth daily. Continue to hold until instructed to resume in clinic. 90 tablet  10/15/2021   ? atorvastatin (LIPITOR) 20 mg tablet Take one tablet by mouth daily.   10/15/2021   ? blood-glucose meter,continuous (DEXCOM G6 RECEIVER MISC)   Dexcom G6 Receiver, See Instructions, 1 EA, Use as directed., 0 Number of Refills, 0, Route to Pharmacy Electronically, [facility], [facility] (New Address), Instructions Replace Required Details, [id number]_[id number], 185, cm, 04/25/20 11:38:00 CST, Height   10/15/2021   ? blood-glucose sensor (DEXCOM G6 SENSOR MISC)   Dexcom G6 Sensor, See Instructions, 9 EA, Use as directed.  Change every 10 days., 3 Number of Refills, 3, Route to Pharmacy Electronically, [facility], [facility] (New Address), Instructions Replace Required Details, [id number], 185, cm, 04/25/20...   10/15/2021   ? blood-glucose transmitter (DEXCOM G6 TRANSMITTER MISC)   Dexcom G6 Transmitter, See Instructions, 1 EA, Use as directed., 3  Number of Refills, 3, Route to Pharmacy Electronically, [facility], [facility] (New Address), Instructions Replace Required Details, [id number], 185, cm, 04/25/20 11:38:00 CST, Height   10/15/2021   ? buPROPion HCL SR (WELLBUTRIN SR) 150 mg tablet, 12 hr sustained-release Take one tablet by mouth twice daily.   10/15/2021   ? dutasteride (AVODART) 0.5 mg capsule Take one capsule by mouth at bedtime daily.   10/15/2021   ? heparin (porcine) PF 5,000units/0.73mL injection syringe Inject 0.5 mL under the skin every 8 hours.   10/15/2021   ? insulin glargine (LANTUS SOLOSTAR U-100 INSULIN) 100 unit/mL (3 mL) subcutaneous PEN Inject fifteen Units under the skin at bedtime daily. 45 mL 0 10/15/2021   ? levothyroxine (SYNTHROID) 150 mcg tablet Take two tablets by mouth daily 30 minutes before breakfast.   10/15/2021   ? levothyroxine (SYNTHROID) 25 mcg tablet Take one tablet by mouth daily 30 minutes before breakfast. Take in addition to 300 mcg to make total 325 mcg daily. 90 tablet 3 10/15/2021   ? losartan (COZAAR) 25 mg tablet Take one-half tablet by mouth daily. 90 tablet  10/15/2021   ? melatonin 5 mg tablet Take one tablet by mouth at bedtime daily. 90 tablet  10/15/2021   ? metoprolol tartrate (LOPRESSOR) 50 mg tablet Take one tablet by mouth twice daily.   10/15/2021   ? MULTIVITAMIN PO Take 1 tablet by mouth daily.   10/15/2021   ? ondansetron (ZOFRAN ODT) 8 mg rapid dissolve tablet Dissolve one tablet by mouth every 8 hours as needed. 30 tablet  10/15/2021   ? oxyCODONE 10 mg tablet Take one tablet by mouth every 6 hours.  0 10/15/2021   ? polyethylene glycol 3350 (MIRALAX) 17 g packet Take one packet by mouth daily as needed. 12 each  10/15/2021   ? senna (SENOKOT) 8.6 mg tablet Take one tablet by mouth twice daily as needed. 180 tablet  10/15/2021   ? sertraline (ZOLOFT) 50 mg tablet Take two tablets by mouth daily.   10/15/2021   ? tamsulosin (FLOMAX) 0.4 mg capsule Take one capsule by mouth at bedtime daily. Do not crush, chew or open capsules. Take 30 minutes following the same meal each day. 90 capsule  10/15/2021   ? tiZANidine (ZANAFLEX) 2 mg tablet Take two tablets by mouth at bedtime daily. May also take one tablet three times daily as needed. 120 tablet  10/15/2021   ? triamcinolone acetonide (KENALOG) 0.025 % topical cream Apply  topically to affected area daily as needed.   10/15/2021     Allergies   Allergen Reactions   ? Ambien [Zolpidem] HALLUCINATIONS   ? Statins-Hmg-Coa Reductase Inhibitors MUSCLE PAIN     Tolerates low dose atorvastatin   ? Ambien [Zolpidem] AGITATION       Social History:   Social History     Tobacco Use   ? Smoking status: Never   ? Smokeless tobacco: Never   Vaping Use   ? Vaping Use: Never used   Substance Use Topics   ? Alcohol use: Never     Comment: ~ once a month      Family History   Problem Relation Age of Onset   ? Hypertension Mother    ? Cancer-Lung Mother 44   ? Melanoma Mother    ? Cancer Mother         lung   ? Diabetes Mother    ? Heart Disease Mother    ? Hyperlipidemia Mother    ?  Thyroid Disease Mother    ? Diabetes Father    ? Arthritis-osteo Sister    ? Hypertension Sister    ? Heart Disease Sister    ? Cancer Sister         lymphoma   ? Diabetes Sister    ? Arthritis Sister    ? Cancer Sister    ? Cancer-Lung Brother 80   ? Hyperlipidemia Brother    ? Hypertension Brother    ? Diabetes Brother    ? Arthritis Brother    ? Cancer Brother         lung   ? Kidney Disease Brother    ? Cancer-Colon Maternal Aunt    ? Cancer Maternal Uncle    ? Unknown to Patient Paternal Aunt    ? Unknown to Patient Paternal Uncle    ? Unknown to Patient Maternal Grandmother    ? Unknown to Patient Maternal Grandfather    ? Unknown to Patient Paternal Grandmother    ? Unknown to Patient Paternal Grandfather    ? Cancer Sister         esophaguse   ? Diabetes Sister    ? Diabetes Brother    ? Heart Disease Sister    ? Obesity Brother         Review of Systems  All other systems reviewed and are negative.    Previous Anesthetic/Sedation History:  Reviewed    Code Status: Prior    Physical Exam:  Vital Signs: Last Filed In 24 Hours Vital Signs: 24 Hour Range   BP: 132/73 (07/24 0945)  Pulse: 72 (07/24 0945)  Respirations: 13 PER MINUTE (07/24 0945) BP: (132)/(73)   Pulse:  [72]   Respirations:  [13 PER MINUTE]             General appearance: alert and no distress  Neurologic: Grossly normal, at baseline  Lungs: Nonlabored with normal effort  Abdomen: soft, non-tender.   Extremities: extremities normal,       Airway:  airway assessment performed  Mallampati II (soft palate, uvula, fauces visible)   Anesthesia Classification:  ASA III (A patient with a severe systemic disease that limits activity, but is not incapacitating)  Pre procedure anxiolysis plan: Midazolam  Sedation/Medication Plan: Fentanyl, Lidocaine and Midazolam  Personal history of sedation complications: Denies adverse event.   Family history of sedation complications: Denies adverse event.   Medications for Reversal: Naloxone and Flumazenil  Discussion/Reviews:  Physician has discussed risks and alternatives of this type of sedation and above planned procedures with patient  NPO Status: Acceptable  Pregnancy Status: N/A        Lab/Radiology/Other Diagnostic Tests:  Labs:    Hematology:    Lab Results   Component Value Date    HGB 8.4 08/29/2021    HCT 26.5 08/29/2021    PLTCT 318 08/29/2021    WBC 6.6 08/29/2021    NEUT 80 08/15/2021    ANC 8.62 08/15/2021    ALC 1.15 08/15/2021    MONA 9 08/15/2021    AMC 0.98 08/15/2021    ABC 0.02 08/15/2021    MCV 85.7 08/29/2021    MCHC 31.7 08/29/2021    MPV 8.3 08/29/2021    RDW 17.5 08/29/2021   , Coagulation:    Lab Results   Component Value Date    PT 11.0 08/12/2021    PTT 31.1 08/12/2021    INR 1.0 08/12/2021    and General Chemistry:    Lab  Results   Component Value Date    NA 141 08/29/2021    K 3.8 08/29/2021    CL 107 08/29/2021    GAP 9 08/29/2021    BUN 22 08/29/2021    CR 1.27 08/29/2021    GLU 115 08/29/2021    CA 9.8 08/29/2021    ALBUMIN 3.3 08/01/2021    OBSCA 1.21 08/12/2021    MG 2.1 08/17/2021    TOTBILI 0.2 08/01/2021              Philmore Pali, APRN-NP  Pager [phone]

## 2021-10-16 NOTE — Progress Notes
Sedation physician present in room.  Recent vitals and patient condition reviewed between sedating physician and nurse.  Reassessment completed.  Determination made to proceed with planned sedation.

## 2021-10-16 NOTE — Progress Notes
@   1129:  Foley cath placed during IR procedure.  IR MD will speak w/ SUR prior to the pre/post nurse removing the Foley.

## 2021-10-16 NOTE — Progress Notes
Right 8 Fr NephU removed during IR procedure.

## 2021-10-17 ENCOUNTER — Encounter: Admit: 2021-10-17 | Discharge: 2021-10-17 | Payer: MEDICARE

## 2021-10-17 NOTE — Telephone Encounter
Orders for lab work faxed to [facility] attn:Cheryl per request to fax number provided, [phone].

## 2021-10-17 NOTE — Telephone Encounter
Patient's spouse called regarding lab orders, requesting orders to be faxed to [place] who is coming Thursday and will draw. Fax: [phone] Ph. [phone]

## 2021-10-19 ENCOUNTER — Encounter: Admit: 2021-10-19 | Discharge: 2021-10-19 | Payer: MEDICARE

## 2021-10-19 NOTE — Telephone Encounter
Received message from [place] who reports that Jimmy Waters is currently admitted in the [facility] hospital with a UTI, currently getting IV antibiotics. They have an upcoming appointment with Dr Debria Garret and wanted to update him on his current hospitalization.  Will forward to Dr Vivia Budge team and request records.

## 2021-10-23 ENCOUNTER — Encounter: Admit: 2021-10-23 | Discharge: 2021-10-23 | Payer: MEDICARE

## 2021-10-23 NOTE — Progress Notes
7 male  Generalized weakness  Follows with [place] urology multiple foley's placed/nephrostomy tubes in the past  UTI diagnosis   Started on meropenum hx pseudomonas   Resistant to meropenum per cultures only sensitive to Amikacin and gentamycin   Mid line placed to send home on antibiotics but feel this will be an on going issue until he has a suprabubic cath placed  VSS   Mentation baseline     Advised of capacity and that this may not be an emergent surgical intervention but to run by out PA-will call with an update

## 2021-10-26 NOTE — Progress Notes
Date of Service: 09/29/2021     Subjective:            I have reviewed this patients current past medical history, surgical history, family history, social history, allergies, and medications.    History of Present Illness    Jimmy Waters is a 66 y.o. male with obesity, HTN, HLD, DMII on insulin (A1C 7.2%), metastatic thyroid cancer, hypothyroidism, GERD, arthritis, depression and BPH with urinary retention and recurrent urosepsis, previously managed with a Foley catheter, recent RIGHT ureteral injury s/p nephroureteral drain further complicated by a subcapsular hematoma, now preadmitted to medicine with concern for a complicated UTI ahead of his scheduled 5/4 neck dissection for thyroid cancer.  ?  He has a complicated recent urological history with [facility], summarized below:    He has a history of BPH for which he previously managed with [facility] however he developed infections that he described as hematuria and dysuria after catheterization. He had a catheter placed in November 2022 that was changed every 4-6 weeks he was planning on undergoing aquablation with Dr. Willaim Bane in late 2022 though had trouble getting to his surgical date due to recurrent urosepsis. In 02/2021 he was noted to have RIGHT sided pain with hydronephrosis to the distal ureter on CT imaging though artifact from his bilateral hip replacements obscured visualization of the distal ureter. He subsequently underwent ureteroscopy of the distal ureter and retrograde pyelogram on 03/15/21 which did not demonstrate a calculus. A 6 Fr x 26 cm ureteral stent was subsequently inserted.     He was later rescheduled for R ureteroscopy, aquablation and SPT insertion with Dr. Willaim Bane on 05/18/21 for BPH, urinary retention and possible kidney stone. He later presented on 05/12/21 to the ED with RLQ abdominal pain and Foley catheter balloon inflated in the distal RIGHT ureter. The Foley was re-positioned and follow-up cystogram demonstrated a small volume bladder with contrast extravasation from the ureter at the previous site of the Foley balloon. A RIGHT nephrostomy tube was inserted for urinary diversion. This admission was complicated by MRSA UTI and bacteremia and at some point his Foley was removed. His R PNCT and ureteral stent was converted to a PCNU on 3/28 which was complicated by an 8.3 cm subcapsular hematoma, AKI and MDR E. Coli urosepsis. He ultimately discharged to inpatient rehab.  ?  Most recently, an outpatient UA attained ahead of his upcoming thyroid cancer surgery was consistent with UTI and he was subsequently directed to the [facility] ED where he was preadmitted to medicine. His only symptom at the time of the UA collection was gross hematuria. He denied any additional LUTS, fever/chills, pain or nausea/vomiting. For the past few months he mostly voids via incontinence. CT AP w/ contrast demonstrates a non-enhancing, 0.8 cm RIGHT subcapsular fluid collection which communicates with a linear hypodensity in the anterior lower pole. His RIGHT PCNU is in good position and there is no hydronephrosis. There is mild thickening of the distal LEFT ureter. Evaluation of his pelvis is limited from artificat from his bilateral hip replacements.    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  He also endorses having an exploratory laparotomy done at some point in the past due to concern for colovesicular fistula which was ultimately negative.    He suffered a ground-level fall and May 2023 and underwent right lower extremity ORIF if this periprosthetic fracture on 08/13/2021.  He  does have a history of multidrug-resistant urinary tract infections with his right-sided nephrostomy tube, infectious disease was consulted during his inpatient rehabilitation stay due to multidrug-resistant Pseudomonas for which he completed a course of meropenem on 08/20/2021.    His right percutaneous nephroureteral stent was exchanged on 07/25/2021 which was complicated by right inferior artery bleed which was coil embolized at that time as well as multiple right renal artery branches.    INTERVAL HISTORY:    Jimmy Waters is a 66 y.o. male with complicated urologic history including BPH with small capacity bladder and recent trauma to right ureteral orifice with Foley balloon placement into the right UO s/p R PCNU placement on 06/20/21 c/b urosepsis and subcapsular hematoma.     He presents today for follow-up of his end-stage bladder.  He was seen in interventional radiology on 10/16/2021 with an antegrade of his right PCNU removed as there was no extravasation.  Interventional radiology did attempt to place a suprapubic catheter however due to his low capacity bladder no window was able to be achieved and a urethral Foley catheter was placed at that time.    Patient was recently admitted to on 10/19/2021 [facility] with a UTI.  His urine culture was multidrug-resistant Pseudomonas sensitive to amikacin and gentamicin.  A midline was placed and the patient was sent home on antibiotics.    He presents today for follow-up and discussion of next steps.    He is currently on renally dosed IV gent for his MDR Pseudomonas UTI that he is receiving every 36 hours. States he has two doses left.     Review of Systems:  Negative except as above     Medical History:   Diagnosis Date   ? Anxiety disorder    ? Back pain    ? Barrett esophagus    ? BPH (benign prostatic hyperplasia)    ? Cancer of skin    ? Cataract    ? Chronic kidney disease unknown   ? Chronic lower back pain    ? Chronic UTI    ? Degenerative arthritis    ? Depression (disease)    ? Diverticulitis of colon (without mention of hemorrhage)(562.11)     type 2   ? DJD (degenerative joint disease)    ? DM type 2 (diabetes mellitus, type 2) (HCC)    ? Dyslipidemia    ? Erectile dysfunction 2021   ? Falls 2022   ? Gastroparesis    ? Generalized headaches 2022   ? GERD (gastroesophageal reflux disease)    ? Gynecomastia    ? History of dental problems     lower partial   ? History of Nissen fundoplication ~1990   ? HLD (hyperlipidemia)    ? HTN (hypertension)    ? Hx of arthroscopy of knee    ? Hypothyroidism    ? Infection    ? Kidney stones    ? 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 Neuropathy    ? Osteoarthritis    ? Poor balance 2022   ? Shiga toxin-producing Escherichia coli infection    ? Skin cancer    ? Squamous cell carcinoma    ? Stomach disorder    ? Thyroid cancer (HCC) 2009   ? Thyroid nodule 2009, 2017, 2022   ? Unspecified deficiency anemia 2022     Surgical History:   Procedure Laterality Date   ? HX  LUMBAR FUSION  1982    L4-5   ? HX THYROIDECTOMY  09/2007   ? CYSTOURETHROSCOPY  2012   ? KIDNEY STONE SURGERY  2012   ? COLONOSCOPY  02/14/2011   ? HX APPENDECTOMY  02/2011   ? REPEAT LEFT LUMBAR 5 TO SACRAL 1 LAMINOTOMY N/A 01/02/2019    Performed by Karie Chimera, MD at [facility] OR   ? INCISION AND DRAINAGE POSTOPERATIVE WOUND INFECTION - COMPLEX N/A 01/20/2019    Performed by Karie Chimera, MD at [facility] OR   ? NEPHROSTOMY Right 04/2021    in place from right ureter from OSH   ? Revision right neck dissection Right 07/27/2021    Performed by Marlowe Aschoff, MD at [facility] OR   ? FLEXIBLE LARYNGOSCOPY DIAGNOSTIC N/A 07/27/2021    Performed by Marlowe Aschoff, MD at [facility] OR   ? OPEN TREATMENT FEMORAL SHAFT FRACTURE WITH PLATE/ SCREWS WITH/ WITHOUT CERCLAGE Right 08/13/2021    Performed by Rayford Halsted, MD at [facility] OR   ? CATARACT REMOVAL WITH IMPLANT Bilateral    ? CYSTOSCOPY     ? HX HIP REPLACEMENT Bilateral     revision of 1 hip also   ? HX KNEE ARTHROSCOPY     ? HX KNEE REPLACMENT Bilateral    ? HX LAPAROTOMY      possible colon fistula   ? HX LYMPHADENECTOMY     ? HX ROTATOR CUFF REPAIR Bilateral     [id number]   ? HX SHOULDER REPLACEMENT Bilateral    ? HX TONSILLECTOMY     ? HX VASECTOMY     ? PARATHYROID GLAND SURGERY  2009   ? THYROIDECTOMY ? UPPER GASTROINTESTINAL ENDOSCOPY         Objective:         Current Outpatient Medications   Medication Instructions   ? acetaminophen (TYLENOL) 650 mg, Oral, EVERY  6 HOURS PRN   ? aspirin EC (ASPIR-LOW) 81 mg, Oral, DAILY, Continue to hold until instructed to resume in clinic.   ? atorvastatin (LIPITOR) 20 mg, Oral, DAILY   ? blood-glucose meter,continuous (DEXCOM G6 RECEIVER MISC)   Dexcom G6 Receiver, See Instructions, 1 EA, Use as directed., 0 Number of Refills, 0, Route to Pharmacy Electronically, [facility], [facility] (New Address), Instructions Replace Required Details, NCPDP_[id number], 185, cm, 04/25/20 11:38:00 CST, Height   ? blood-glucose sensor (DEXCOM G6 SENSOR MISC)   Dexcom G6 Sensor, See Instructions, 9 EA, Use as directed.  Change every 10 days., 3 Number of Refills, 3, Route to Pharmacy Electronically, [facility], [facility] (New Address), Instructions Replace Required Details, [id number]_[id number], 185, cm, 04/25/20...   ? blood-glucose transmitter (DEXCOM G6 TRANSMITTER MISC)   Dexcom G6 Transmitter, See Instructions, 1 EA, Use as directed., 3 Number of Refills, 3, Route to Pharmacy Electronically, [facility], [facility] (New Address), Instructions Replace Required Details, [id number]_[id number], 185, cm, 04/25/20 11:38:00 CST, Height   ? buPROPion HCL SR (WELLBUTRIN SR) 150 mg, Oral, TWICE DAILY   ? dutasteride (AVODART) 0.5 mg, Oral, AT BEDTIME DAILY   ? heparin (porcine) PF 5,000 Units, Subcutaneous, EVERY  8 HOURS   ? insulin glargine (LANTUS SOLOSTAR U-100 INSULIN) 15 Units, Subcutaneous, AT BEDTIME DAILY   ? levothyroxine (SYNTHROID) 300 mcg, Oral, DAILY BEFORE BREAKFAST   ? levothyroxine (SYNTHROID) 25 mcg, Oral, DAILY BEFORE BREAKFAST, Take in addition to 300 mcg to make total 325 mcg daily.   ? losartan (COZAAR) 12.5 mg, Oral, DAILY   ?  melatonin 5 mg, Oral, AT BEDTIME DAILY   ? metoprolol tartrate (LOPRESSOR) 50 mg, Oral, TWICE DAILY   ? MULTIVITAMIN PO 1 tablet, Oral, DAILY   ? ondansetron (ZOFRAN ODT) 8 mg, Oral, EVERY  8 HOURS PRN   ? oxyCODONE 10 mg, Oral, EVERY  6 HOURS   ? polyethylene glycol 3350 (MIRALAX) 17 g, Oral, DAILY  PRN   ? senna (SENOKOT) 8.6 mg tablet 1 tablet, Oral, TWICE DAILY PRN   ? sertraline (ZOLOFT) 100 mg, Oral, DAILY   ? tamsulosin (FLOMAX) 0.4 mg, Oral, AT BEDTIME DAILY, Do not crush, chew or open capsules. Take 30 minutes following the same meal each day.   ? tiZANidine (ZANAFLEX) 2 mg tablet Take two tablets by mouth at bedtime daily. May also take one tablet three times daily as needed.   ? triamcinolone acetonide (KENALOG) 0.025 % topical cream Topical, DAILY  PRN     There were no vitals filed for this visit.      Physical Exam:     General: Thin, frail male in NAD. In WC   HEENT: normocephalic and atraumatic  Respiratory: Equal chest rise bilaterally, non labored respirations  GI: soft, nontender, nondistended, no organomegaly. Midline exploratory laparotomy scar  GU: no CVA tenderness. Urethral Foley in place. R PCNU site well healed  Neuro: grossly intact moves all extremities  Psych: normal behavior, normal cognition  Extremities: no edema, no cyanosis  Skin: warm, dry, intact    IMAGING (reviewed by me):    CT CAP W/ 08/12/21:     Right PCNU in place. High density material  within the right renal collecting system. Gas within the right renal collecting system, likely due to indwelling stent. Subcapsular hematoma of  the posterior right kidney measures up to 0.7 cm (series 2 image 155).  Hypoenhancement of the inferior portion of the right kidney. No discrete  right renal laceration is identified. Left renal cysts. No evidence of  acute traumatic injury to the left kidney. No extravasation of contrast material from the opacified portions of the collecting systems and ureters bilaterally.         IR Antegrade Study 10/16/21:    No extravasation.          Assessment and Plan:    DOREEN BALCERZAK is a 66 y.o. male with complicated urologic history including BPH with small capacity bladder and recent trauma to right ureteral orifice with Foley balloon placement into the right UO s/p R PCNU placement on 06/20/21 c/b urosepsis and subcapsular hematoma now with R PCNU removal and urethral Foley as IR unable to place SPT due to low capacity bladder.    Complex patient with multiple comorbidities with a likely end-stage bladder that is low capacity and poorly functioning.  He continues to get multidrug-resistant infections primarily of Pseudomonas, most recently this occurred at an outside hospital of which the records and labs were reviewed.  He was sent home on prolonged course of IV antibiotics via midline presents today for follow-up.    An antegrade study with interventional radiology was performed on 10/16/2021.  The images of this were reviewed, there is no clear contrast extravasation and the percutaneous nephroureteral stent was removed.  Attempt was made to place a suprapubic catheter at this time however given the patient's low capacity bladder there was no adequate window for interventional radiology to place this any urethral Foley was placed and confirmed fluoroscopically.    Given the patient's end-stage small capacity bladder and frequent  symptoms with multidrug-resistant urinary tract infections there remain limited options at this time.  He is not an ideal candidate for management with a urethral Foley catheter given that he has extremely blown out and patulous ureteral orifices with the Foley catheter balloon being placed into his right ureteral orifice causing damage previously.  There is no adequate window for placement of a suprapubic catheter.  At this time the most likely course of action would be a simple cystectomy with urinary diversion however given the patient's recent orthopedic procedures and comorbidities he would need multiple clearances.    He has had urodynamic studies by [facility] and he and his wife do describe an end-stage bladder.  We do not have the results of the studies and prior to bladder removal we would ideally like to repeat the studies to ensure that the patient does have a low capacity end-stage bladder and that he would be best served by a simple cystectomy which carries with it significant risk.    We again discussed the risks of a simple cystectomy which given his comorbidities and recent procedures are numerous.  We will plan to schedule him for a urodynamic study to evaluate his bladder at the next available slot.  Should this indeed prove to be end-stage bladder he will need multiple clearances but we will likely need to proceed with a simple cystectomy and urinary diversion at that time.  Both he and his wife are understanding and on board with this.    We briefly discussed the role for sphincterotomy as well however this will depend on the results of his UDS.     PLAN:  - Continue course of abx via midline for MDRO  - Repeat UDS at next available appointment  - Likely moving towards simple cystectomy with diversion which we again discussed today, will discuss further pending UDS results    Orlene Och, MD  Urologic Oncology Fellow

## 2021-10-27 ENCOUNTER — Encounter: Admit: 2021-10-27 | Discharge: 2021-10-27 | Payer: MEDICARE

## 2021-10-27 ENCOUNTER — Ambulatory Visit: Admit: 2021-10-27 | Discharge: 2021-10-28 | Payer: MEDICARE

## 2021-10-27 ENCOUNTER — Ambulatory Visit: Admit: 2021-10-27 | Discharge: 2021-10-27 | Payer: MEDICARE

## 2021-10-27 DIAGNOSIS — K2270 Barrett's esophagus without dysplasia: Secondary | ICD-10-CM

## 2021-10-27 DIAGNOSIS — S3710XD Unspecified injury of ureter, subsequent encounter: Secondary | ICD-10-CM

## 2021-10-27 DIAGNOSIS — W19XXXA Unspecified fall, initial encounter: Secondary | ICD-10-CM

## 2021-10-27 DIAGNOSIS — K3184 Gastroparesis: Secondary | ICD-10-CM

## 2021-10-27 DIAGNOSIS — E890 Postprocedural hypothyroidism: Secondary | ICD-10-CM

## 2021-10-27 DIAGNOSIS — E039 Hypothyroidism, unspecified: Secondary | ICD-10-CM

## 2021-10-27 DIAGNOSIS — N400 Enlarged prostate without lower urinary tract symptoms: Secondary | ICD-10-CM

## 2021-10-27 DIAGNOSIS — N200 Calculus of kidney: Secondary | ICD-10-CM

## 2021-10-27 DIAGNOSIS — C73 Malignant neoplasm of thyroid gland: Secondary | ICD-10-CM

## 2021-10-27 DIAGNOSIS — IMO0002 Squamous cell carcinoma: Secondary | ICD-10-CM

## 2021-10-27 DIAGNOSIS — Z9889 Other specified postprocedural states: Secondary | ICD-10-CM

## 2021-10-27 DIAGNOSIS — G629 Polyneuropathy, unspecified: Secondary | ICD-10-CM

## 2021-10-27 DIAGNOSIS — N529 Male erectile dysfunction, unspecified: Secondary | ICD-10-CM

## 2021-10-27 DIAGNOSIS — Z01818 Encounter for other preprocedural examination: Secondary | ICD-10-CM

## 2021-10-27 DIAGNOSIS — K5732 Diverticulitis of large intestine without perforation or abscess without bleeding: Secondary | ICD-10-CM

## 2021-10-27 DIAGNOSIS — N390 Urinary tract infection, site not specified: Secondary | ICD-10-CM

## 2021-10-27 DIAGNOSIS — A498 Other bacterial infections of unspecified site: Secondary | ICD-10-CM

## 2021-10-27 DIAGNOSIS — M545 Chronic lower back pain: Secondary | ICD-10-CM

## 2021-10-27 DIAGNOSIS — C4490 Unspecified malignant neoplasm of skin, unspecified: Secondary | ICD-10-CM

## 2021-10-27 DIAGNOSIS — R2689 Other abnormalities of gait and mobility: Secondary | ICD-10-CM

## 2021-10-27 DIAGNOSIS — K319 Disease of stomach and duodenum, unspecified: Secondary | ICD-10-CM

## 2021-10-27 DIAGNOSIS — D539 Nutritional anemia, unspecified: Secondary | ICD-10-CM

## 2021-10-27 DIAGNOSIS — E785 Hyperlipidemia, unspecified: Secondary | ICD-10-CM

## 2021-10-27 DIAGNOSIS — F419 Anxiety disorder, unspecified: Secondary | ICD-10-CM

## 2021-10-27 DIAGNOSIS — K219 Gastro-esophageal reflux disease without esophagitis: Secondary | ICD-10-CM

## 2021-10-27 DIAGNOSIS — E119 Type 2 diabetes mellitus without complications: Secondary | ICD-10-CM

## 2021-10-27 DIAGNOSIS — M1990 Unspecified osteoarthritis, unspecified site: Secondary | ICD-10-CM

## 2021-10-27 DIAGNOSIS — I10 Essential (primary) hypertension: Secondary | ICD-10-CM

## 2021-10-27 DIAGNOSIS — M549 Dorsalgia, unspecified: Secondary | ICD-10-CM

## 2021-10-27 DIAGNOSIS — B999 Unspecified infectious disease: Secondary | ICD-10-CM

## 2021-10-27 DIAGNOSIS — N62 Hypertrophy of breast: Secondary | ICD-10-CM

## 2021-10-27 DIAGNOSIS — E3120 Multiple endocrine neoplasia [MEN] syndrome, unspecified: Secondary | ICD-10-CM

## 2021-10-27 DIAGNOSIS — D649 Anemia, unspecified: Secondary | ICD-10-CM

## 2021-10-27 DIAGNOSIS — N189 Chronic kidney disease, unspecified: Secondary | ICD-10-CM

## 2021-10-27 DIAGNOSIS — D093 Carcinoma in situ of thyroid and other endocrine glands: Secondary | ICD-10-CM

## 2021-10-27 DIAGNOSIS — Z8719 Personal history of other diseases of the digestive system: Secondary | ICD-10-CM

## 2021-10-27 DIAGNOSIS — R519 Generalized headaches: Secondary | ICD-10-CM

## 2021-10-27 DIAGNOSIS — E041 Nontoxic single thyroid nodule: Secondary | ICD-10-CM

## 2021-10-27 LAB — BASIC METABOLIC PANEL
ANION GAP: 13 % — ABNORMAL HIGH (ref 3–12)
BLD UREA NITROGEN: 34 mg/dL — ABNORMAL HIGH (ref 7–25)
CALCIUM: 9.3 mg/dL (ref 8.5–10.6)
CHLORIDE: 104 MMOL/L (ref 98–110)
CO2: 23 MMOL/L (ref 21–30)
CREATININE: 1.6 mg/dL — ABNORMAL HIGH (ref 0.4–1.24)
EGFR: 44 mL/min — ABNORMAL LOW (ref >60)
GLUCOSE,PANEL: 166 mg/dL — ABNORMAL HIGH (ref 70–100)
POTASSIUM: 4.2 MMOL/L — ABNORMAL HIGH (ref 3.5–5.1)
SODIUM: 140 MMOL/L (ref 137–147)

## 2021-10-27 LAB — CBC AND DIFF
ABSOLUTE BASO COUNT: 0 K/UL (ref 0–0.20)
ABSOLUTE EOS COUNT: 0.2 K/UL (ref 0–0.45)
ABSOLUTE LYMPH COUNT: 1.5 K/UL (ref 1.0–4.8)
ABSOLUTE MONO COUNT: 0.5 K/UL (ref 0–0.80)
HEMATOCRIT: 34 % — ABNORMAL LOW (ref 40–50)
HEMOGLOBIN: 11 g/dL — ABNORMAL LOW (ref 13.5–16.5)
MCH: 26 pg (ref 26–34)
MCV: 82 FL (ref 80–100)
RBC COUNT: 4.2 M/UL — ABNORMAL LOW (ref 4.4–5.5)
WBC COUNT: 9.6 K/UL (ref 4.5–11.0)

## 2021-10-27 LAB — IRON + BINDING CAPACITY + %SAT+ FERRITIN
% SATURATION: 14 % — ABNORMAL LOW (ref 28–42)
FERRITIN: 199 ng/mL (ref 30–300)
IRON BINDING: 292 ug/dL (ref 270–380)
IRON: 42 ug/dL — ABNORMAL LOW (ref 50–185)

## 2021-10-27 LAB — TSH WITH FREE T4 REFLEX: TSH: 2.1 uU/mL (ref 0.35–5.00)

## 2021-10-27 LAB — THYROID STIMULATING HORMONE-TSH: TSH: 2.1 uU/mL (ref 0.35–5.00)

## 2021-10-27 LAB — THYROGLOBULIN & THYROGLOBULIN AB
ANTI THYROGLOB ANTI: 15 [IU]/mL (ref <116)
THYROGLOBULIN: 1.1 ng/mL — ABNORMAL LOW (ref 1.7–55.0)

## 2021-10-27 NOTE — Patient Instructions
About Urodynamics:  Urodynamics is a set of tests that measure bladder function (including storage and emptying) allowing physicians to understand and treat patients with bladder problems such as urinary leakage (incontinence) or bladder emptying (voiding).  The examination is performed by nurses who have been specially trained in urodynamic testing.    During a Urodynamics Study  The study may be done in your doctor?s office, a clinic, or a hospital. The study may take up to an hour or more. This depends on which tests your doctor performs. The tests are generally painless. You won?t need sedating medication.  Tests That May Be Done  Uroflowmetry. This measures the amount and speed of urine you void from your bladder. You urinate into a funnel. It?s attached to a computer that records your urine flow over time. The amount of urine left in your bladder after you void may also be measured right after this test.  Cystometry. This test evaluates how much your bladder can hold. It also measures how strong your bladder muscle is and how well the signals work that tell you when your bladder is full. Your health care provider fills your bladder with sterile water or saline solution, through a catheter. Your doctor will instruct you to report any sensations you feel. Mention if they?re similar to symptoms you?ve felt at home. Your doctor may ask you to cough, stand and walk, or bear down during this test.  Electromyogram. This helps evaluate the muscle contractions that control urination, such as sphincter muscle contractions. Your health care provider may place electrode patches or wires near your rectum or urethra to make the recording. He or she may ask you to try to tighten or relax your sphincter muscles during this test.  Pressure flow study. This test measures your detrusor, urethral, and abdominal pressures. Detrusor is the muscle surrounding the bladder walls that relaxes to allow your bladder to fill, and and contracts to squeeze out urine. A Pressure Flow Study is often performed after cystometry. You?re asked to urinate while a probe in your urethra measures pressures.    At the beginning of the test, you will be asked to urinate into a machine that measures your urine flow rate, so should arrive with a moderately full bladder.  We will test your urine for signs of infection. If an infection is present the test will need to be rescheduled. Please, contact our office before your appointment if you are having urinary tract infection symptoms.  For the procedure, a small catheter is placed into your bladder and one into the rectum for pressure measurements.  The catheters are taped and connected to tubing that allows sterile water to be slowly infused into your bladder.  After filling is complete, you will be asked to empty your bladder completely.    Instructions for the day of your urodynamics:  Report to the session with a moderately full bladder.  Try not to urinate at least one hour before arriving to the clinic.  If you have been diagnosed with Interstitial Cystitis, Neurogenic Bladder, Urinary Retention, or if you are unable to urinate, please empty your bladder before reporting for your procedure.  Wear comfortable clothing.  Eat and drink normally for the exam.  Do not fast.  Expect a minimum of one hour for the examination.  Discontinue all bladder relaxant medication three (3) days prior to examination, unless specifically advised to continue all medications.  Medications to be avoided are: Bentyl, Ditropan, Detrol, Enablex, Gelnique, Hyoscamine, Levbid, Levsin,  Lesinex, Oxybutynin, Oxytrol, Oxytrol Patch, Pyridium, Pyridium Plus, [place], Toviaz, Urecholine, [place], [place], and [address].  If you are on medications for prostate enlargement such as Cardura, Doxazosin, Flomax, Tamsulosin, Hytrin, Terazosin, Rapaflo, Silodosin, please contact the office for further instructions.  If you have questions regarding your specific medication, contact your doctor?s office.    Getting Your Results  After the study, you?ll get dressed and return to the consultation room. Test results may be ready soon after the study is finished. Or, you may return to your doctor?s office in a few days for your results. Your doctor can talk with you about the study report and your options.  If you have a known urinary tract infection, please reschedule your appointment until treatment is completed.  Contact your doctor?s office for further instructions.  Thank you for choosing the [facility] Department of Urology for your health care needs.  If you must cancel your Urodynamic Study appointment, please provide 72 hours [three (3) business days] notice which allows [place] to provide this important service to another patient.

## 2021-10-28 DIAGNOSIS — N319 Neuromuscular dysfunction of bladder, unspecified: Secondary | ICD-10-CM

## 2021-10-30 ENCOUNTER — Encounter: Admit: 2021-10-30 | Discharge: 2021-10-30 | Payer: MEDICARE

## 2021-10-30 ENCOUNTER — Ambulatory Visit: Admit: 2021-10-30 | Discharge: 2021-10-31 | Payer: MEDICARE

## 2021-10-30 DIAGNOSIS — F419 Anxiety disorder, unspecified: Secondary | ICD-10-CM

## 2021-10-30 DIAGNOSIS — K2270 Barrett's esophagus without dysplasia: Secondary | ICD-10-CM

## 2021-10-30 DIAGNOSIS — E039 Hypothyroidism, unspecified: Secondary | ICD-10-CM

## 2021-10-30 DIAGNOSIS — N529 Male erectile dysfunction, unspecified: Secondary | ICD-10-CM

## 2021-10-30 DIAGNOSIS — K3184 Gastroparesis: Secondary | ICD-10-CM

## 2021-10-30 DIAGNOSIS — E3120 Multiple endocrine neoplasia [MEN] syndrome, unspecified: Secondary | ICD-10-CM

## 2021-10-30 DIAGNOSIS — R519 Generalized headaches: Secondary | ICD-10-CM

## 2021-10-30 DIAGNOSIS — C73 Malignant neoplasm of thyroid gland: Secondary | ICD-10-CM

## 2021-10-30 DIAGNOSIS — E041 Nontoxic single thyroid nodule: Secondary | ICD-10-CM

## 2021-10-30 DIAGNOSIS — N200 Calculus of kidney: Secondary | ICD-10-CM

## 2021-10-30 DIAGNOSIS — W19XXXA Unspecified fall, initial encounter: Secondary | ICD-10-CM

## 2021-10-30 DIAGNOSIS — C4490 Unspecified malignant neoplasm of skin, unspecified: Secondary | ICD-10-CM

## 2021-10-30 DIAGNOSIS — R2689 Other abnormalities of gait and mobility: Secondary | ICD-10-CM

## 2021-10-30 DIAGNOSIS — E785 Hyperlipidemia, unspecified: Secondary | ICD-10-CM

## 2021-10-30 DIAGNOSIS — K319 Disease of stomach and duodenum, unspecified: Secondary | ICD-10-CM

## 2021-10-30 DIAGNOSIS — E119 Type 2 diabetes mellitus without complications: Secondary | ICD-10-CM

## 2021-10-30 DIAGNOSIS — M545 Chronic lower back pain: Secondary | ICD-10-CM

## 2021-10-30 DIAGNOSIS — N390 Urinary tract infection, site not specified: Secondary | ICD-10-CM

## 2021-10-30 DIAGNOSIS — IMO0002 Squamous cell carcinoma: Secondary | ICD-10-CM

## 2021-10-30 DIAGNOSIS — N62 Hypertrophy of breast: Secondary | ICD-10-CM

## 2021-10-30 DIAGNOSIS — E890 Postprocedural hypothyroidism: Secondary | ICD-10-CM

## 2021-10-30 DIAGNOSIS — K219 Gastro-esophageal reflux disease without esophagitis: Secondary | ICD-10-CM

## 2021-10-30 DIAGNOSIS — N189 Chronic kidney disease, unspecified: Secondary | ICD-10-CM

## 2021-10-30 DIAGNOSIS — Z9889 Other specified postprocedural states: Secondary | ICD-10-CM

## 2021-10-30 DIAGNOSIS — M549 Dorsalgia, unspecified: Secondary | ICD-10-CM

## 2021-10-30 DIAGNOSIS — Z8719 Personal history of other diseases of the digestive system: Secondary | ICD-10-CM

## 2021-10-30 DIAGNOSIS — D539 Nutritional anemia, unspecified: Secondary | ICD-10-CM

## 2021-10-30 DIAGNOSIS — M1990 Unspecified osteoarthritis, unspecified site: Secondary | ICD-10-CM

## 2021-10-30 DIAGNOSIS — K5732 Diverticulitis of large intestine without perforation or abscess without bleeding: Secondary | ICD-10-CM

## 2021-10-30 DIAGNOSIS — N400 Enlarged prostate without lower urinary tract symptoms: Secondary | ICD-10-CM

## 2021-10-30 DIAGNOSIS — I10 Essential (primary) hypertension: Secondary | ICD-10-CM

## 2021-10-30 DIAGNOSIS — A498 Other bacterial infections of unspecified site: Secondary | ICD-10-CM

## 2021-10-30 DIAGNOSIS — B999 Unspecified infectious disease: Secondary | ICD-10-CM

## 2021-10-30 DIAGNOSIS — E1165 Type 2 diabetes mellitus with hyperglycemia: Secondary | ICD-10-CM

## 2021-10-30 DIAGNOSIS — G629 Polyneuropathy, unspecified: Secondary | ICD-10-CM

## 2021-10-30 MED ORDER — TRULICITY 0.75 MG/0.5 ML SC PNIJ
.75 mg | SUBCUTANEOUS | 1 refills | Status: AC
Start: 2021-10-30 — End: ?

## 2021-10-30 NOTE — Progress Notes
CGM note    Patient's Personal continuous glucose monitor Dexcom download reviewed.  At least 72 hours of data reviewed and interpreted.  Data interpreted 10/30/2021  Data interpreted from 7/25-10/30/21    Findings:   Average glucose 174  SD 54  55% glucose in range  32% high  9% very high  3% low    Interpretation high glucose after meals.    Continuous glucose monitor report scanned in epic.    Electronically signed by Jacqlyn Krauss, MD 10/30/2021

## 2021-10-30 NOTE — Progress Notes
Chief Complaint   Patient presents with   ? Diabetes   ? Cancer Surveillance   ? Hypothyroidism        Date of Service: 10/30/2021    Note to patient: The 21st Century Cures Act makes medical notes like these available to patients in the interest of transparency. However, be advised this is a medical document. It is intended as peer to peer communication. It is written in medical language and may contain abbreviations or verbiage that are unfamiliar. It may appear blunt or direct. Medical documents are intended to carry relevant information, facts as evident, and the clinical opinion of the practitioner.    Jimmy Waters is a 66 y.o. male. DOB: 01/16/1956   MRN#: [id number]    HPI:  Seen today for thyroid cancer.  Reviewed oncology notes.  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  Jan 2018 - RAI?154 mCi?Post treatment scan showed some uptake on thyroid bed and mediastinal nodes.  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 She removed a nodule near his thyroidectomy scar. ?Pathology confirmed metastatic Papillary Thyroid Cancer.  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    ?  07/13/2020?CT Neck w/contrast?[facility]  1. No CT evidence of a suspicious neck mass or suspicious lymphadenopathy by CT size criteria.  2. Symmetric prominence of bilateral false and true vocal cores at the level of the hyoid bone and correlate with clinical exam.  ?  07/27/21 Revision Right Central Neck Dissection.  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  Thyroid tumor board: Recommended radioactive iodine treatment.    He is on levothyroxine 325 mcg daily.   He has been at rehab and in and out of hospital for a long time.  Patient and wife do not think that he was getting medicine properly on an empty stomach in the hospital.  He was getting it with the rest of the medicines.    He has Type 2 DM for many years.  Current treatment:  Humalog 18 units with meals.  Lantus 20-30 units daily.  He has nausea with Xultophy.    He was on Trulicity in the past but it was stopped due to thyroid cancer diagnosis.  Trulicity is expensive.  He was on Jardiance but it was stopped when he developed a kidney problem.  He has nausea off and on.    Glucose readings are elevated.    Review of Systems   Constitutional: Negative for fever.   Cardiovascular: Negative for chest pain.         Objective:     ? acetaminophen (TYLENOL) 325 mg tablet Take two tablets by mouth every 6 hours as needed for Pain.   ? aspirin EC 81 mg tablet Take one tablet by mouth daily. Continue to hold until instructed to resume in clinic.   ? atorvastatin (LIPITOR) 20 mg tablet Take one tablet by mouth daily.   ? blood-glucose meter,continuous (DEXCOM G6 RECEIVER MISC)   Dexcom G6 Receiver, See Instructions, 1 EA, Use as directed., 0 Number of Refills, 0, Route to Pharmacy Electronically, [facility], [facility] (New Address), Instructions Replace Required Details, [id number]_[id number], 185, cm, 04/25/20 11:38:00 CST, Height   ?  blood-glucose sensor (DEXCOM G6 SENSOR MISC)   Dexcom G6 Sensor, See Instructions, 9 EA, Use as directed.  Change every 10 days., 3 Number of Refills, 3, Route to Pharmacy Electronically, [facility], [facility] (New Address), Instructions Replace Required Details, [id number]_[id number], 185, cm, 04/25/20...   ? blood-glucose transmitter (DEXCOM G6 TRANSMITTER MISC)   Dexcom G6 Transmitter, See Instructions, 1 EA, Use as directed., 3 Number of Refills, 3, Route to Pharmacy Electronically, [facility], [facility] (New Address), Instructions Replace Required Details, [id number]_[id number], 185, cm, 04/25/20 11:38:00 CST, Height   ? buPROPion HCL SR (WELLBUTRIN SR) 150 mg tablet, 12 hr sustained-release Take one tablet by mouth twice daily.   ? dulaglutide (TRULICITY) 0.75 mg/0.5 mL injection pen Inject 0.5 mL under the skin every 7 days.   ? dutasteride (AVODART) 0.5 mg capsule Take one capsule by mouth at bedtime daily.   ? insulin glargine (LANTUS SOLOSTAR U-100 INSULIN) 100 unit/mL (3 mL) subcutaneous PEN Inject fifteen Units under the skin at bedtime daily.   ? levothyroxine (SYNTHROID) 150 mcg tablet Take two tablets by mouth daily 30 minutes before breakfast.   ? levothyroxine (SYNTHROID) 25 mcg tablet Take one tablet by mouth daily 30 minutes before breakfast. Take in addition to 300 mcg to make total 325 mcg daily.   ? losartan (COZAAR) 25 mg tablet Take one-half tablet by mouth daily.   ? melatonin 5 mg tablet Take one tablet by mouth at bedtime daily.   ? metoprolol tartrate (LOPRESSOR) 50 mg tablet Take one tablet by mouth twice daily.   ? MULTIVITAMIN PO Take 1 tablet by mouth daily.   ? ondansetron (ZOFRAN ODT) 8 mg rapid dissolve tablet Dissolve one tablet by mouth every 8 hours as needed.   ? polyethylene glycol 3350 (MIRALAX) 17 g packet Take one packet by mouth daily as needed.   ? senna (SENOKOT) 8.6 mg tablet Take one tablet by mouth twice daily as needed.   ? sertraline (ZOLOFT) 50 mg tablet Take two tablets by mouth daily.   ? tamsulosin (FLOMAX) 0.4 mg capsule Take one capsule by mouth at bedtime daily. Do not crush, chew or open capsules. Take 30 minutes following the same meal each day.   ? tiZANidine (ZANAFLEX) 2 mg tablet Take two tablets by mouth at bedtime daily. May also take one tablet three times daily as needed.   ? triamcinolone acetonide (KENALOG) 0.025 % topical cream Apply  topically to affected area daily as needed.     There were no vitals filed for this visit.  There is no height or weight on file to calculate BMI.     Physical Exam  Vitals and nursing note reviewed. Constitutional:       General: He is not in acute distress.     Appearance: Normal appearance. He is not ill-appearing, toxic-appearing or diaphoretic.   HENT:      Head: Normocephalic and atraumatic.      Nose: Nose normal.   Eyes:      Conjunctiva/sclera: Conjunctivae normal.   Neurological:      Mental Status: He is alert and oriented to person, place, and time.   Psychiatric:         Mood and Affect: Mood normal.               Lab   Thyroid Studies    Lab Results   Component Value Date/Time    TSH 2.10 10/27/2021 02:15 PM    TSH 2.10 10/27/2021 02:15 PM  No results found for: [facility]     Surgical Pathology   May 4th  Final Diagnosis:     A. 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 ?     B. Skeletal muscle, fibroadipose tissue, and lymph nodes (2), level 7   neck dissection: ?   Metastatic/recurrent thyroid follicular carcinoma, oncocytic (Hurthle   cell) type involving the soft tissue. ?   Lymph nodes (2): There is no evidence of malignancy. ?(0/2)     Assessment and Plan:    Dustin D. Ackers was seen today for diabetes, cancer surveillance and hypothyroidism.    Diagnoses and all orders for this visit:    Age-related osteoporosis with current pathological fracture, initial encounter  -     BONE DENSITY SPINE/HIP; Future; Expected date: 10/30/2021  -     25-OH VITAMIN D (D2 + D3); Future; Expected date: 10/30/2021  -     PARATHYROID HORMONE; Future; Expected date: 10/30/2021    Uncontrolled type 2 diabetes mellitus with hyperglycemia, with long-term current use of insulin (HCC)    Other orders  -     dulaglutide (TRULICITY) 0.75 mg/0.5 mL injection pen; Inject 0.5 mL under the skin every 7 days.      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  Goal TSH to be suppressed. Pathology indicates Hurthle cell carcinoma.    Plan for another radioactive iodine treatment 150 mCi.  Last contrast was May.  Plan for treatment in September.    Goal TSH suppressed.  Patient does not think that he was getting levothyroxine on an empty stomach in morning.  Continue levothyroxine 325 mcg daily for now.  TSH again in September.    Uncontrolled type 2 diabetes: Postprandial hyperglycemia.  He has nausea with Xultophy.    He is not getting Jardiance currently.  His creatinine is high.  We will wait before resuming SGLT2 inhibitor.  Continue insulin Humalog 18 units units with meals.  Humalog or Novolog:  If blood sugar 151-200: Take extra 2 unit.  If blood sugar 201-250: Take extra 4 units.  If blood sugar 251-300: Take extra 6 units.  If blood sugar is 301-350: Take extra 8 units.  If blood sugars > 350: Take extra 10 units.    Continue insulin Lantus 20 units daily.    Start Trulicity 0.75 mg weekly to control postprandial hyperglycemia.  If not able to get Trulicity then we will increase the dose of Humalog.          45 minutes spent Discussing diabetes with the patient and wife, and making plan for him, documentation.    Electronically signed by Lucrezia Starch, MD 10/30/2021

## 2021-10-30 NOTE — Progress Notes
1410 Called Patient to check him in wife asked that I call back in 5 minutes   1415 Attempted to call Patient no answer left message will try back in 5 minutes  Obtained patient's verbal consent to treat them and their agreement to [place] and NPP via this telehealth visit during the [facility] Emergency

## 2021-10-31 DIAGNOSIS — M8000XA Age-related osteoporosis with current pathological fracture, unspecified site, initial encounter for fracture: Secondary | ICD-10-CM

## 2021-11-01 ENCOUNTER — Encounter: Admit: 2021-11-01 | Discharge: 2021-11-01 | Payer: MEDICARE

## 2021-11-01 NOTE — Telephone Encounter
Called and reviewed outpatient release and completed requirements for outpatient for RAI.  Reviewed Thyrogen and Low Iodine requirements.  Low Iodine diet sent via My Chart.    Verbalized understanding and no further questions at this time.  Thyrogen Cancer Treatment Physician Orders for RAI following rTSH completed and sent to Dr. Gustavus Messing for signature.

## 2021-11-05 ENCOUNTER — Encounter: Admit: 2021-11-05 | Discharge: 2021-11-05 | Payer: MEDICARE

## 2021-11-13 ENCOUNTER — Encounter: Admit: 2021-11-13 | Discharge: 2021-11-13 | Payer: MEDICARE

## 2021-11-14 ENCOUNTER — Encounter: Admit: 2021-11-14 | Discharge: 2021-11-14 | Payer: MEDICARE

## 2021-11-14 MED ORDER — THYROTROPIN ALFA 0.9 MG IM SOLR
.9 mg | Freq: Once | INTRAMUSCULAR | 0 refills
Start: 2021-11-14 — End: ?

## 2021-11-15 ENCOUNTER — Encounter: Admit: 2021-11-15 | Discharge: 2021-11-15 | Payer: MEDICARE

## 2021-11-17 ENCOUNTER — Encounter: Admit: 2021-11-17 | Discharge: 2021-11-17 | Payer: MEDICARE

## 2021-11-29 NOTE — Progress Notes
Date of Service: 09/29/2021     Subjective:            I have reviewed this patients current past medical history, surgical history, family history, social history, allergies, and medications.    History of Present Illness    Jimmy Waters is a 66 y.o. male with obesity, HTN, HLD, DMII on insulin (A1C 7.2%), metastatic thyroid cancer, hypothyroidism, GERD, arthritis, depression and BPH with urinary retention and recurrent urosepsis, previously managed with a Foley catheter, recent RIGHT ureteral injury s/p nephroureteral drain further complicated by a subcapsular hematoma, now preadmitted to medicine with concern for a complicated UTI ahead of his scheduled 5/4 neck dissection for thyroid cancer.  ?  He has a complicated recent urological history with [facility], summarized below:    He has a history of BPH for which he previously managed with [facility] however he developed infections that he described as hematuria and dysuria after catheterization. He had a catheter placed in November 2022 that was changed every 4-6 weeks he was planning on undergoing aquablation with Dr. Willaim Bane in late 2022 though had trouble getting to his surgical date due to recurrent urosepsis. In 02/2021 he was noted to have RIGHT sided pain with hydronephrosis to the distal ureter on CT imaging though artifact from his bilateral hip replacements obscured visualization of the distal ureter. He subsequently underwent ureteroscopy of the distal ureter and retrograde pyelogram on 03/15/21 which did not demonstrate a calculus. A 6 Fr x 26 cm ureteral stent was subsequently inserted.     He was later rescheduled for R ureteroscopy, aquablation and SPT insertion with Dr. Willaim Bane on 05/18/21 for BPH, urinary retention and possible kidney stone. He later presented on 05/12/21 to the ED with RLQ abdominal pain and Foley catheter balloon inflated in the distal RIGHT ureter. The Foley was re-positioned and follow-up cystogram demonstrated a small volume bladder with contrast extravasation from the ureter at the previous site of the Foley balloon. A RIGHT nephrostomy tube was inserted for urinary diversion. This admission was complicated by MRSA UTI and bacteremia and at some point his Foley was removed. His R PNCT and ureteral stent was converted to a PCNU on 3/28 which was complicated by an 8.3 cm subcapsular hematoma, AKI and MDR E. Coli urosepsis. He ultimately discharged to inpatient rehab.  ?  Most recently, an outpatient UA attained ahead of his upcoming thyroid cancer surgery was consistent with UTI and he was subsequently directed to the [facility] ED where he was preadmitted to medicine. His only symptom at the time of the UA collection was gross hematuria. He denied any additional LUTS, fever/chills, pain or nausea/vomiting. For the past few months he mostly voids via incontinence. CT AP w/ contrast demonstrates a non-enhancing, 0.8 cm RIGHT subcapsular fluid collection which communicates with a linear hypodensity in the anterior lower pole. His RIGHT PCNU is in good position and there is no hydronephrosis. There is mild thickening of the distal LEFT ureter. Evaluation of his pelvis is limited from artificat from his bilateral hip replacements.    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  He also endorses having an exploratory laparotomy done at some point in the past due to concern for colovesicular fistula which was ultimately negative.    He suffered a ground-level fall and May 2023 and underwent right lower extremity ORIF if this periprosthetic fracture on 08/13/2021.  He  does have a history of multidrug-resistant urinary tract infections with his right-sided nephrostomy tube, infectious disease was consulted during his inpatient rehabilitation stay due to multidrug-resistant Pseudomonas for which he completed a course of meropenem on 08/20/2021.    His right percutaneous nephroureteral stent was exchanged on 07/25/2021 which was complicated by right inferior artery bleed which was coil embolized at that time as well as multiple right renal artery branches.    10/16/21: Antegrade study of R PCNU which was removed. No SPT placed due to low capacity bladder. Urethral Foley placed    10/19/21: Admitted for MDR UTI    INTERVAL HISTORY:    Jimmy Waters is a 66 y.o. male with complicated urologic history including BPH with small capacity bladder and recent trauma to right ureteral orifice with Foley balloon placement into the right UO s/p R PCNU placement on 06/20/21 c/b urosepsis and subcapsular hematoma.     He presents today for follow-up of his end-stage bladder and UDS.     Present today with wife. Underwent UDS and catheter change.     Medical History:   Diagnosis Date   ? Anxiety disorder    ? Back pain    ? Barrett esophagus    ? BPH (benign prostatic hyperplasia)    ? Cancer of skin    ? Cataract    ? Chronic kidney disease unknown   ? Chronic lower back pain    ? Chronic UTI    ? Degenerative arthritis    ? Depression (disease)    ? Diverticulitis of colon (without mention of hemorrhage)(562.11)     type 2   ? DJD (degenerative joint disease)    ? DM type 2 (diabetes mellitus, type 2) (HCC)    ? Dyslipidemia    ? Erectile dysfunction 2021   ? Falls 2022   ? Gastroparesis    ? Generalized headaches 2022   ? GERD (gastroesophageal reflux disease)    ? Gynecomastia    ? History of dental problems     lower partial   ? History of Nissen fundoplication ~1990   ? HLD (hyperlipidemia)    ? HTN (hypertension)    ? Hx of arthroscopy of knee    ? Hypothyroidism    ? Infection    ? Kidney stones    ? 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 Neuropathy    ? Osteoarthritis    ? Poor balance 2022   ? Shiga toxin-producing Escherichia coli infection    ? Skin cancer    ? Squamous cell carcinoma    ? Stomach disorder    ? Thyroid cancer (HCC) 2009   ? Thyroid nodule 2009, 2017, 2022 ? Unspecified deficiency anemia 2022     Surgical History:   Procedure Laterality Date   ? HX LUMBAR FUSION  1982    L4-5   ? HX THYROIDECTOMY  09/2007   ? CYSTOURETHROSCOPY  2012   ? KIDNEY STONE SURGERY  2012   ? COLONOSCOPY  02/14/2011   ? HX APPENDECTOMY  02/2011   ? REPEAT LEFT LUMBAR 5 TO SACRAL 1 LAMINOTOMY N/A 01/02/2019    Performed by Karie Chimera, MD at [facility]   ? INCISION AND DRAINAGE POSTOPERATIVE WOUND INFECTION - COMPLEX N/A 01/20/2019    Performed by Karie Chimera, MD at [facility] OR   ? NEPHROSTOMY Right 04/2021    in place from right ureter from  OSH   ? Revision right neck dissection Right 07/27/2021    Performed by Marlowe Aschoff, MD at [facility] OR   ? FLEXIBLE LARYNGOSCOPY DIAGNOSTIC N/A 07/27/2021    Performed by Marlowe Aschoff, MD at [facility] OR   ? OPEN TREATMENT FEMORAL SHAFT FRACTURE WITH PLATE/ SCREWS WITH/ WITHOUT CERCLAGE Right 08/13/2021    Performed by Rayford Halsted, MD at [facility] OR   ? CATARACT REMOVAL WITH IMPLANT Bilateral    ? CYSTOSCOPY     ? HX HIP REPLACEMENT Bilateral     revision of 1 hip also   ? HX KNEE ARTHROSCOPY     ? HX KNEE REPLACMENT Bilateral    ? HX LAPAROTOMY      possible colon fistula   ? HX LYMPHADENECTOMY     ? HX ROTATOR CUFF REPAIR Bilateral     [id number]   ? HX SHOULDER REPLACEMENT Bilateral    ? HX TONSILLECTOMY     ? HX VASECTOMY     ? PARATHYROID GLAND SURGERY  2009   ? THYROIDECTOMY     ? UPPER GASTROINTESTINAL ENDOSCOPY         Objective:         Current Outpatient Medications   Medication Instructions   ? acetaminophen (TYLENOL) 650 mg, Oral, EVERY  6 HOURS PRN   ? aspirin EC (ASPIR-LOW) 81 mg, Oral, DAILY, Continue to hold until instructed to resume in clinic.   ? atorvastatin (LIPITOR) 20 mg, Oral, DAILY   ? blood-glucose meter,continuous (DEXCOM G6 RECEIVER MISC)   Dexcom G6 Receiver, See Instructions, 1 EA, Use as directed., 0 Number of Refills, 0, Route to Pharmacy Electronically, [facility], [facility] (New Address), Instructions Replace Required Details, NCPDP_[id number], 185, cm, 04/25/20 11:38:00 CST, Height   ? blood-glucose sensor (DEXCOM G6 SENSOR MISC)   Dexcom G6 Sensor, See Instructions, 9 EA, Use as directed.  Change every 10 days., 3 Number of Refills, 3, Route to Pharmacy Electronically, [facility], [facility] (New Address), Instructions Replace Required Details, [id number]_[id number], 185, cm, 04/25/20...   ? blood-glucose transmitter (DEXCOM G6 TRANSMITTER MISC)   Dexcom G6 Transmitter, See Instructions, 1 EA, Use as directed., 3 Number of Refills, 3, Route to Pharmacy Electronically, [facility], [facility] (New Address), Instructions Replace Required Details, [id number]_[id number], 185, cm, 04/25/20 11:38:00 CST, Height   ? buPROPion HCL SR (WELLBUTRIN SR) 150 mg, Oral, TWICE DAILY   ? dutasteride (AVODART) 0.5 mg, Oral, AT BEDTIME DAILY   ? insulin glargine (LANTUS SOLOSTAR U-100 INSULIN) 15 Units, Subcutaneous, AT BEDTIME DAILY   ? levothyroxine (SYNTHROID) 300 mcg, Oral, DAILY BEFORE BREAKFAST   ? levothyroxine (SYNTHROID) 25 mcg, Oral, DAILY BEFORE BREAKFAST, Take in addition to 300 mcg to make total 325 mcg daily.   ? losartan (COZAAR) 12.5 mg, Oral, DAILY   ? melatonin 5 mg, Oral, AT BEDTIME DAILY   ? metoprolol tartrate (LOPRESSOR) 50 mg, Oral, TWICE DAILY   ? MULTIVITAMIN PO 1 tablet, Oral, DAILY   ? ondansetron (ZOFRAN ODT) 8 mg, Oral, EVERY  8 HOURS PRN   ? polyethylene glycol 3350 (MIRALAX) 17 g, Oral, DAILY  PRN   ? senna (SENOKOT) 8.6 mg tablet 1 tablet, Oral, TWICE DAILY PRN   ? sertraline (ZOLOFT) 100 mg, Oral, DAILY   ? tamsulosin (FLOMAX) 0.4 mg, Oral, AT BEDTIME DAILY, Do not crush, chew or open capsules. Take 30 minutes following the same meal each day.   ? tiZANidine (ZANAFLEX) 2 mg tablet Take two tablets by  mouth at bedtime daily. May also take one tablet three times daily as needed.   ? triamcinolone acetonide (KENALOG) 0.025 % topical cream Topical, DAILY  PRN   ? TRULICITY 0.75 mg, Subcutaneous, EVERY  7 DAYS     There were no vitals filed for this visit.      Physical Exam:     General: Thin, frail male in NAD. In WC   HEENT: normocephalic and atraumatic  Respiratory: Equal chest rise bilaterally, non labored respirations  GI: soft, nontender, nondistended, no organomegaly. Midline exploratory laparotomy scar  GU: no CVA tenderness. Urethral Foley in place. R PCNU site well healed  Neuro: grossly intact moves all extremities  Psych: normal behavior, normal cognition  Extremities: no edema, no cyanosis  Skin: warm, dry, intact    IMAGING (reviewed by me):    CT CAP W/ 08/12/21:     Right PCNU in place. High density material  within the right renal collecting system. Gas within the right renal collecting system, likely due to indwelling stent. Subcapsular hematoma of  the posterior right kidney measures up to 0.7 cm (series 2 image 155).  Hypoenhancement of the inferior portion of the right kidney. No discrete  right renal laceration is identified. Left renal cysts. No evidence of  acute traumatic injury to the left kidney. No extravasation of contrast material from the opacified portions of the collecting systems and ureters bilaterally.         IR Antegrade Study 10/16/21:    No extravasation.           UDS 12/01/21: Patient underwent urodynamic studies today.  Urodynamic studies significant for small capacity bladder with terminal DO at 50 cc.  The test was repeated which did again show terminal DO at around 50 cc.  He had significant detrusor overactivity during filling.  His detrusor pressure remained relatively stable during the filling phase.    Voids primarily via Valsalva.  Not obstructed on bladder contractility index or bladder outlet antibiotics.  Postvoid residual was 27 cc.    Interpretation patient has a end-stage bladder with low capacity and significant detrusor overactivity.  It does store small volumes of a safe pressure with no risk of transmission to the upper tracts however we will need to continually monitor this. Assessment and Plan:    Jimmy Waters is a 66 y.o. male with complicated urologic history including BPH with small capacity bladder and recent trauma to right ureteral orifice with Foley balloon placement into the right UO s/p R PCNU placement on 06/20/21 c/b urosepsis and subcapsular hematoma now with R PCNU removal and urethral Foley as IR unable to place SPT due to low capacity bladder.    UDS today showing low capacity bladder with terminal detrusor overactivity at about 50 cc.  This is in line with why he ultimately failed intermittent catheterization regimen with leaks in between given that his bladder capacity is 50 cc.    We had a prolonged discussion today regarding the results of his urodynamic studies which essentially show that his bladder has minimal storage however is ultimately not hostile.  We will need to continually monitor his upper tracts for any signs of deterioration however his management options are relatively sparse.    He not a candidate for intermittent catheterization given his small capacity bladder.  His options include management with a Foley catheter.  We discussed that this can be done via urethral catheter or suprapubic catheter.  Given that he is currently still  recovering from his recent fracture and would likely need placement of a suprapubic catheter in the operating room we will defer this for now.  He has been free of urinary tract infections for the last 6 weeks.  We did discuss that he will always be colonized and that bacteria does not always constitute urinary tract infection.  He and his wife are understanding of this.      We also briefly discussed his other option of a simple cystectomy.  This is an extremely aggressive surgery that would be solely taken as a last resort given his multiple comorbidities and frailty.  Both he and his wife are understanding that this is a last ditch effort, we did discuss that this would involve removing his bladder and reconstructing the ileal conduit using part of his small bowel anastomosis of his ureters to the small bowel.  We discussed significant risk of morbidity at this operation including bile leaks, ileus, anastomotic leaks, and other complications associated with this.    He and his wife both understand that this would be a quality of life surgery and very much would involve significant amount of risk to improve his quality of life which at this point is improving.    After this discussion we agreed that maintaining the urethral catheter at this time would be the most ideal with changes every 4 to 6 weeks.    PLAN:  - RTC in 4-6 weeks for Foley change  - May be interested in SPT in the future but will allow further recovery from femur fracture, can discuss at next visit in 3 months  - FU in 3 months with Renal US and BMP    Orlene Och, MD  Urologic Oncology Fellow

## 2021-12-01 ENCOUNTER — Ambulatory Visit: Admit: 2021-12-01 | Discharge: 2021-12-01 | Payer: MEDICARE

## 2021-12-01 ENCOUNTER — Encounter: Admit: 2021-12-01 | Discharge: 2021-12-01 | Payer: MEDICARE

## 2021-12-01 DIAGNOSIS — N319 Neuromuscular dysfunction of bladder, unspecified: Secondary | ICD-10-CM

## 2021-12-01 DIAGNOSIS — W19XXXA Unspecified fall, initial encounter: Secondary | ICD-10-CM

## 2021-12-01 DIAGNOSIS — E119 Type 2 diabetes mellitus without complications: Secondary | ICD-10-CM

## 2021-12-01 DIAGNOSIS — C73 Malignant neoplasm of thyroid gland: Secondary | ICD-10-CM

## 2021-12-01 DIAGNOSIS — G629 Polyneuropathy, unspecified: Secondary | ICD-10-CM

## 2021-12-01 DIAGNOSIS — N529 Male erectile dysfunction, unspecified: Secondary | ICD-10-CM

## 2021-12-01 DIAGNOSIS — A498 Other bacterial infections of unspecified site: Secondary | ICD-10-CM

## 2021-12-01 DIAGNOSIS — R2689 Other abnormalities of gait and mobility: Secondary | ICD-10-CM

## 2021-12-01 DIAGNOSIS — F419 Anxiety disorder, unspecified: Secondary | ICD-10-CM

## 2021-12-01 DIAGNOSIS — R519 Generalized headaches: Secondary | ICD-10-CM

## 2021-12-01 DIAGNOSIS — C4490 Unspecified malignant neoplasm of skin, unspecified: Secondary | ICD-10-CM

## 2021-12-01 DIAGNOSIS — N189 Chronic kidney disease, unspecified: Secondary | ICD-10-CM

## 2021-12-01 DIAGNOSIS — B999 Unspecified infectious disease: Secondary | ICD-10-CM

## 2021-12-01 DIAGNOSIS — M549 Dorsalgia, unspecified: Secondary | ICD-10-CM

## 2021-12-01 DIAGNOSIS — Z9889 Other specified postprocedural states: Secondary | ICD-10-CM

## 2021-12-01 DIAGNOSIS — N62 Hypertrophy of breast: Secondary | ICD-10-CM

## 2021-12-01 DIAGNOSIS — K2270 Barrett's esophagus without dysplasia: Secondary | ICD-10-CM

## 2021-12-01 DIAGNOSIS — K319 Disease of stomach and duodenum, unspecified: Secondary | ICD-10-CM

## 2021-12-01 DIAGNOSIS — E785 Hyperlipidemia, unspecified: Secondary | ICD-10-CM

## 2021-12-01 DIAGNOSIS — N390 Urinary tract infection, site not specified: Secondary | ICD-10-CM

## 2021-12-01 DIAGNOSIS — K3184 Gastroparesis: Secondary | ICD-10-CM

## 2021-12-01 DIAGNOSIS — E041 Nontoxic single thyroid nodule: Secondary | ICD-10-CM

## 2021-12-01 DIAGNOSIS — M545 Chronic lower back pain: Secondary | ICD-10-CM

## 2021-12-01 DIAGNOSIS — N200 Calculus of kidney: Secondary | ICD-10-CM

## 2021-12-01 DIAGNOSIS — K5732 Diverticulitis of large intestine without perforation or abscess without bleeding: Secondary | ICD-10-CM

## 2021-12-01 DIAGNOSIS — E039 Hypothyroidism, unspecified: Secondary | ICD-10-CM

## 2021-12-01 DIAGNOSIS — Z8719 Personal history of other diseases of the digestive system: Secondary | ICD-10-CM

## 2021-12-01 DIAGNOSIS — M1990 Unspecified osteoarthritis, unspecified site: Secondary | ICD-10-CM

## 2021-12-01 DIAGNOSIS — S3710XD Unspecified injury of ureter, subsequent encounter: Secondary | ICD-10-CM

## 2021-12-01 DIAGNOSIS — K219 Gastro-esophageal reflux disease without esophagitis: Secondary | ICD-10-CM

## 2021-12-01 DIAGNOSIS — IMO0002 Squamous cell carcinoma: Secondary | ICD-10-CM

## 2021-12-01 DIAGNOSIS — D539 Nutritional anemia, unspecified: Secondary | ICD-10-CM

## 2021-12-01 DIAGNOSIS — N400 Enlarged prostate without lower urinary tract symptoms: Secondary | ICD-10-CM

## 2021-12-01 DIAGNOSIS — I10 Essential (primary) hypertension: Secondary | ICD-10-CM

## 2021-12-01 DIAGNOSIS — E3120 Multiple endocrine neoplasia [MEN] syndrome, unspecified: Secondary | ICD-10-CM

## 2021-12-01 MED ORDER — SULFAMETHOXAZOLE-TRIMETHOPRIM 800-160 MG PO TAB
1 | Freq: Once | ORAL | 0 refills | Status: CP
Start: 2021-12-01 — End: ?
  Administered 2021-12-01: 17:00:00 1 via ORAL

## 2021-12-01 NOTE — Progress Notes
Pt presented to clinic for urodynamic study. Timeout performed with pt. Procedure explained and consent signed. Prior to URD, pt's foley catheter removed in usual fashion. This RN completed URD. Pt tolerated well. Following URD, genitals prepped in the usual sterile fashion.  New 16 Fr cath inserted without difficulty. Balloon inflated w/ 10 cc water. Clear, yellow return noted. He tolerated procedure well. Pt prefers leg bag with stat lock. Pt escorted from clinic in stable condition with instructions to return at 1300 for follow up with Dr. Debria Garret.

## 2021-12-11 ENCOUNTER — Ambulatory Visit: Admit: 2021-12-11 | Discharge: 2021-12-12 | Payer: MEDICARE

## 2021-12-11 ENCOUNTER — Encounter: Admit: 2021-12-11 | Discharge: 2021-12-11 | Payer: MEDICARE

## 2021-12-11 DIAGNOSIS — Z09 Encounter for follow-up examination after completed treatment for conditions other than malignant neoplasm: Secondary | ICD-10-CM

## 2021-12-11 DIAGNOSIS — D093 Carcinoma in situ of thyroid and other endocrine glands: Secondary | ICD-10-CM

## 2021-12-11 MED ORDER — THYROTROPIN ALFA 0.9 MG IM SOLR
.9 mg | Freq: Once | INTRAMUSCULAR | 0 refills | Status: CP
Start: 2021-12-11 — End: ?
  Administered 2021-12-11: 21:00:00 0.9 mg via INTRAMUSCULAR

## 2021-12-11 MED ORDER — THYROTROPIN ALFA 0.9 MG IM SOLR
.9 mg | Freq: Once | INTRAMUSCULAR | 0 refills
Start: 2021-12-11 — End: ?

## 2021-12-11 NOTE — Progress Notes
Patient presents to infusion clinic for Thyrogen injection.  Patient denies recent concern for infection, fever, or antibiotic use.  Pre-infusion vital signs and Medication education completed. No lab orders in the therapy plan. RN inquired if the patient needs labs drawn for another provider. Patient denied.    1609: Injection given, see e-mar for administration details.   IM x1 rt gluteal       Patient declined printed AVS. Prompted patient to schedule upcoming appointment(s) at infusion checkout or call infusion scheduling. Patient verbalized understanding. Patient wheeled off unit accompanied by caregiver. 1612    Vitals:    12/11/21 1557   BP: 111/60   BP Source: Arm, Left Upper   Pulse: 85   Temp: 36.7 C (98 F)   Resp: 18   SpO2: 100%   O2 Device: None (Room air)   TempSrc: Tympanic

## 2021-12-11 NOTE — Patient Instructions
Thyrotropin Alfa Printed on 2021-03-06   You must carefully read the Consumer Information Use and Disclaimer below in order to understand and correctly use this information       Pronunciation  (thye roe TROH pin AL fa)  Brand Names: [place]  Thyrogen  Brand Names: [place]  Thyrogen  What is this drug used for?  It is used to treat thyroid cancer.  It is used to check thyroglobulin levels.  What do I need to tell my doctor BEFORE I take this drug?  If you are allergic to this drug; any part of this drug; or any other drugs, foods, or substances. Tell your doctor about the allergy and what signs you had.  This drug may interact with other drugs or health problems.  Tell your doctor and pharmacist about all of your drugs (prescription or OTC, natural products, vitamins) and health problems. You must check to make sure that it is safe for you to take this drug with all of your drugs and health problems. Do not start, stop, or change the dose of any drug without checking with your doctor.  What are some things I need to know or do while I take this drug?  Tell all of your health care providers that you take this drug. This includes your doctors, nurses, pharmacists, and dentists.  Have blood work checked as you have been told by the doctor. Talk with the doctor.  You will need to be sure that you are not dehydrated before getting this drug. Check with your doctor to see if you need to drink extra fluids before getting this drug.  This drug may be given with radioactive iodine. If you are also taking radioactive iodine, be sure you know about the warnings, benefits, and risks. Talk with your doctor if you have questions or concerns.  At times the cancer tumor can get bigger for a few days after you are given this drug. It may affect your throat, voice, or breathing. It may also cause loss of eyesight or weakness on 1 side of the body. Call your doctor right away if any of these effects happen.  There have been deaths caused by an overactive thyroid gland within 24 hours of getting this drug. If you have heart disease, cancer that has spread in the body, or other very bad health problems you may have a higher chance of bad side effects from this drug. Talk with your doctor.  You may be given another drug to lower certain side effects of this drug.  If you are 54 or older, use this drug with care. You could have more side effects.  Tell your doctor if you are pregnant, plan on getting pregnant, or are breast-feeding. You will need to talk about the benefits and risks to you and the baby.  What are some side effects that I need to call my doctor about right away?  WARNING/CAUTION: Even though it may be rare, some people may have very bad and sometimes deadly side effects when taking a drug. Tell your doctor or get medical help right away if you have any of the following signs or symptoms that may be related to a very bad side effect:  Signs of an allergic reaction, like rash; hives; itching; red, swollen, blistered, or peeling skin with or without fever; wheezing; tightness in the chest or throat; trouble breathing, swallowing, or talking; unusual hoarseness; or swelling of the mouth, face, lips, tongue, or throat.  Weakness on 1  side of the body, trouble speaking or thinking, change in balance, drooping on one side of the face, or blurred eyesight.  What are some other side effects of this drug?  All drugs may cause side effects. However, many people have no side effects or only have minor side effects. Call your doctor or get medical help if any of these side effects or any other side effects bother you or do not go away:  Headache.  Upset stomach.  These are not all of the side effects that may occur. If you have questions about side effects, call your doctor. Call your doctor for medical advice about side effects.  You may report side effects to your national health agency.  How is this drug best taken?  Use this drug as ordered by your doctor. Read all information given to you. Follow all instructions closely.  It is given as a shot into a muscle.  What do I do if I miss a dose?  Call your doctor to find out what to do.  How do I store and/or throw out this drug?  If you need to store this drug at home, talk with your doctor, nurse, or pharmacist about how to store it.  General drug facts  If your symptoms or health problems do not get better or if they become worse, call your doctor.  Do not share your drugs with others and do not take anyone else's drugs.  Keep all drugs in a safe place. Keep all drugs out of the reach of children and pets.  Throw away unused or expired drugs. Do not flush down a toilet or pour down a drain unless you are told to do so. Check with your pharmacist if you have questions about the best way to throw out drugs. There may be drug take-back programs in your area.  Some drugs may have another patient information leaflet. If you have any questions about this drug, please talk with your doctor, nurse, pharmacist, or other health care provider.  If you think there has been an overdose, call your poison control center or get medical care right away. Be ready to tell or show what was taken, how much, and when it happened.  Last Reviewed Date  2018-07-17  Consumer Information Use and Disclaimer: This generalized information is a limited summary of diagnosis, treatment, and/or medication information. It is not meant to be comprehensive and should be used as a tool to help the user understand and/or assess potential diagnostic and treatment options. It does NOT include all information about conditions, treatments, medications, side effects, or risks that may apply to a specific patient. It is not intended to be medical advice or a substitute for the medical advice, diagnosis, or treatment of a health care provider based on the health care provider's examination and assessment of a patient's specific and unique circumstances. Patients must speak with a health care provider for complete information about their health, medical questions, and treatment options, including any risks or benefits regarding use of medications. This information does not endorse any treatments or medications as safe, effective, or approved for treating a specific patient. [employer] and its affiliates disclaim any warranty or liability relating to this information or the use thereof. The use of this information is governed by the Terms of Use, available at https://www.wolterskluwer.com/en/know/clinical-effectiveness-terms.  Last Updated 11/07/20

## 2021-12-12 ENCOUNTER — Ambulatory Visit: Admit: 2021-12-12 | Discharge: 2021-12-12 | Payer: MEDICARE

## 2021-12-12 ENCOUNTER — Encounter: Admit: 2021-12-12 | Discharge: 2021-12-12 | Payer: MEDICARE

## 2021-12-12 DIAGNOSIS — K5732 Diverticulitis of large intestine without perforation or abscess without bleeding: Secondary | ICD-10-CM

## 2021-12-12 DIAGNOSIS — Z9889 Other specified postprocedural states: Secondary | ICD-10-CM

## 2021-12-12 DIAGNOSIS — K319 Disease of stomach and duodenum, unspecified: Secondary | ICD-10-CM

## 2021-12-12 DIAGNOSIS — IMO0002 Squamous cell carcinoma: Secondary | ICD-10-CM

## 2021-12-12 DIAGNOSIS — A498 Other bacterial infections of unspecified site: Secondary | ICD-10-CM

## 2021-12-12 DIAGNOSIS — M1990 Unspecified osteoarthritis, unspecified site: Secondary | ICD-10-CM

## 2021-12-12 DIAGNOSIS — E039 Hypothyroidism, unspecified: Secondary | ICD-10-CM

## 2021-12-12 DIAGNOSIS — Z09 Encounter for follow-up examination after completed treatment for conditions other than malignant neoplasm: Secondary | ICD-10-CM

## 2021-12-12 DIAGNOSIS — D093 Carcinoma in situ of thyroid and other endocrine glands: Secondary | ICD-10-CM

## 2021-12-12 DIAGNOSIS — M8000XA Age-related osteoporosis with current pathological fracture, unspecified site, initial encounter for fracture: Secondary | ICD-10-CM

## 2021-12-12 DIAGNOSIS — M545 Chronic lower back pain: Secondary | ICD-10-CM

## 2021-12-12 DIAGNOSIS — N189 Chronic kidney disease, unspecified: Secondary | ICD-10-CM

## 2021-12-12 DIAGNOSIS — N529 Male erectile dysfunction, unspecified: Secondary | ICD-10-CM

## 2021-12-12 DIAGNOSIS — E119 Type 2 diabetes mellitus without complications: Secondary | ICD-10-CM

## 2021-12-12 DIAGNOSIS — N62 Hypertrophy of breast: Secondary | ICD-10-CM

## 2021-12-12 DIAGNOSIS — M549 Dorsalgia, unspecified: Secondary | ICD-10-CM

## 2021-12-12 DIAGNOSIS — N200 Calculus of kidney: Secondary | ICD-10-CM

## 2021-12-12 DIAGNOSIS — E785 Hyperlipidemia, unspecified: Secondary | ICD-10-CM

## 2021-12-12 DIAGNOSIS — Z8719 Personal history of other diseases of the digestive system: Secondary | ICD-10-CM

## 2021-12-12 DIAGNOSIS — E3120 Multiple endocrine neoplasia [MEN] syndrome, unspecified: Secondary | ICD-10-CM

## 2021-12-12 DIAGNOSIS — F419 Anxiety disorder, unspecified: Secondary | ICD-10-CM

## 2021-12-12 DIAGNOSIS — K219 Gastro-esophageal reflux disease without esophagitis: Secondary | ICD-10-CM

## 2021-12-12 DIAGNOSIS — N400 Enlarged prostate without lower urinary tract symptoms: Secondary | ICD-10-CM

## 2021-12-12 DIAGNOSIS — N390 Urinary tract infection, site not specified: Secondary | ICD-10-CM

## 2021-12-12 DIAGNOSIS — R519 Generalized headaches: Secondary | ICD-10-CM

## 2021-12-12 DIAGNOSIS — R2689 Other abnormalities of gait and mobility: Secondary | ICD-10-CM

## 2021-12-12 DIAGNOSIS — I10 Essential (primary) hypertension: Secondary | ICD-10-CM

## 2021-12-12 DIAGNOSIS — K3184 Gastroparesis: Secondary | ICD-10-CM

## 2021-12-12 DIAGNOSIS — K2270 Barrett's esophagus without dysplasia: Secondary | ICD-10-CM

## 2021-12-12 DIAGNOSIS — C4490 Unspecified malignant neoplasm of skin, unspecified: Secondary | ICD-10-CM

## 2021-12-12 DIAGNOSIS — W19XXXA Unspecified fall, initial encounter: Secondary | ICD-10-CM

## 2021-12-12 DIAGNOSIS — E041 Nontoxic single thyroid nodule: Secondary | ICD-10-CM

## 2021-12-12 DIAGNOSIS — G629 Polyneuropathy, unspecified: Secondary | ICD-10-CM

## 2021-12-12 DIAGNOSIS — D539 Nutritional anemia, unspecified: Secondary | ICD-10-CM

## 2021-12-12 DIAGNOSIS — B999 Unspecified infectious disease: Secondary | ICD-10-CM

## 2021-12-12 DIAGNOSIS — C73 Malignant neoplasm of thyroid gland: Secondary | ICD-10-CM

## 2021-12-12 MED ORDER — THYROTROPIN ALFA 0.9 MG IM SOLR
.9 mg | Freq: Once | INTRAMUSCULAR | 0 refills | Status: CP
Start: 2021-12-12 — End: ?
  Administered 2021-12-12: 21:00:00 0.9 mg via INTRAMUSCULAR

## 2021-12-12 MED ORDER — THYROTROPIN ALFA 0.9 MG IM SOLR
.9 mg | Freq: Once | INTRAMUSCULAR | 0 refills | Status: CN
Start: 2021-12-12 — End: ?

## 2021-12-12 NOTE — Progress Notes
1543--pt present for Thyrogen injection, tolerated well.  Declined printed AVS.  Pt wheeled out by wife without incidence.

## 2021-12-13 ENCOUNTER — Ambulatory Visit: Admit: 2021-12-13 | Discharge: 2021-12-13 | Payer: MEDICARE

## 2021-12-13 ENCOUNTER — Encounter: Admit: 2021-12-13 | Discharge: 2021-12-13 | Payer: MEDICARE

## 2021-12-13 DIAGNOSIS — M8000XA Age-related osteoporosis with current pathological fracture, unspecified site, initial encounter for fracture: Secondary | ICD-10-CM

## 2021-12-13 DIAGNOSIS — C73 Malignant neoplasm of thyroid gland: Secondary | ICD-10-CM

## 2021-12-13 LAB — TSH WITH FREE T4 REFLEX: TSH: 230 uU/mL — ABNORMAL HIGH (ref 0.35–5.00)

## 2021-12-13 LAB — 25-OH VITAMIN D (D2 + D3): VITAMIN D (25-OH) TOTAL: 46 ng/mL — ABNORMAL LOW (ref 30–80)

## 2021-12-13 LAB — THYROGLOBULIN & THYROGLOBULIN AB
ANTI THYROGLOB ANTI: 15 [IU]/mL (ref <116)
THYROGLOBULIN: 1.8 ng/mL (ref 1.7–55.0)

## 2021-12-13 LAB — FREE T4-FREE THYROXINE: FREE T4: 1.7 ng/dL — ABNORMAL HIGH (ref 0.6–1.6)

## 2021-12-13 MED ORDER — RP TX I-131 SODIUM IOD MCI
150 | Freq: Once | ORAL | 0 refills | Status: CP
Start: 2021-12-13 — End: ?
  Administered 2021-12-13: 15:00:00 153.4 via ORAL

## 2021-12-18 ENCOUNTER — Encounter: Admit: 2021-12-18 | Discharge: 2021-12-18 | Payer: MEDICARE

## 2021-12-19 ENCOUNTER — Emergency Department: Admit: 2021-12-19 | Discharge: 2021-12-19 | Payer: MEDICARE

## 2021-12-19 ENCOUNTER — Inpatient Hospital Stay: Admit: 2021-12-19 | Payer: MEDICARE

## 2021-12-19 ENCOUNTER — Encounter: Admit: 2021-12-19 | Discharge: 2021-12-19 | Payer: MEDICARE

## 2021-12-19 DIAGNOSIS — A498 Other bacterial infections of unspecified site: Secondary | ICD-10-CM

## 2021-12-19 DIAGNOSIS — G629 Polyneuropathy, unspecified: Secondary | ICD-10-CM

## 2021-12-19 DIAGNOSIS — M549 Dorsalgia, unspecified: Secondary | ICD-10-CM

## 2021-12-19 DIAGNOSIS — N62 Hypertrophy of breast: Secondary | ICD-10-CM

## 2021-12-19 DIAGNOSIS — N200 Calculus of kidney: Secondary | ICD-10-CM

## 2021-12-19 DIAGNOSIS — R319 Hematuria, unspecified: Secondary | ICD-10-CM

## 2021-12-19 DIAGNOSIS — M1990 Unspecified osteoarthritis, unspecified site: Secondary | ICD-10-CM

## 2021-12-19 DIAGNOSIS — R519 Generalized headaches: Secondary | ICD-10-CM

## 2021-12-19 DIAGNOSIS — C4490 Unspecified malignant neoplasm of skin, unspecified: Secondary | ICD-10-CM

## 2021-12-19 DIAGNOSIS — IMO0002 Squamous cell carcinoma: Secondary | ICD-10-CM

## 2021-12-19 DIAGNOSIS — E785 Hyperlipidemia, unspecified: Secondary | ICD-10-CM

## 2021-12-19 DIAGNOSIS — Z8719 Personal history of other diseases of the digestive system: Secondary | ICD-10-CM

## 2021-12-19 DIAGNOSIS — D539 Nutritional anemia, unspecified: Secondary | ICD-10-CM

## 2021-12-19 DIAGNOSIS — N390 Urinary tract infection, site not specified: Secondary | ICD-10-CM

## 2021-12-19 DIAGNOSIS — T83511A Infection and inflammatory reaction due to indwelling urethral catheter, initial encounter: Secondary | ICD-10-CM

## 2021-12-19 DIAGNOSIS — K5732 Diverticulitis of large intestine without perforation or abscess without bleeding: Secondary | ICD-10-CM

## 2021-12-19 DIAGNOSIS — E3120 Multiple endocrine neoplasia [MEN] syndrome, unspecified: Secondary | ICD-10-CM

## 2021-12-19 DIAGNOSIS — K3184 Gastroparesis: Secondary | ICD-10-CM

## 2021-12-19 DIAGNOSIS — E119 Type 2 diabetes mellitus without complications: Secondary | ICD-10-CM

## 2021-12-19 DIAGNOSIS — E039 Hypothyroidism, unspecified: Secondary | ICD-10-CM

## 2021-12-19 DIAGNOSIS — K219 Gastro-esophageal reflux disease without esophagitis: Secondary | ICD-10-CM

## 2021-12-19 DIAGNOSIS — C73 Malignant neoplasm of thyroid gland: Secondary | ICD-10-CM

## 2021-12-19 DIAGNOSIS — I10 Essential (primary) hypertension: Secondary | ICD-10-CM

## 2021-12-19 DIAGNOSIS — M545 Chronic lower back pain: Secondary | ICD-10-CM

## 2021-12-19 DIAGNOSIS — W19XXXA Unspecified fall, initial encounter: Secondary | ICD-10-CM

## 2021-12-19 DIAGNOSIS — K2270 Barrett's esophagus without dysplasia: Secondary | ICD-10-CM

## 2021-12-19 DIAGNOSIS — N189 Chronic kidney disease, unspecified: Secondary | ICD-10-CM

## 2021-12-19 DIAGNOSIS — E041 Nontoxic single thyroid nodule: Secondary | ICD-10-CM

## 2021-12-19 DIAGNOSIS — K319 Disease of stomach and duodenum, unspecified: Secondary | ICD-10-CM

## 2021-12-19 DIAGNOSIS — F419 Anxiety disorder, unspecified: Secondary | ICD-10-CM

## 2021-12-19 DIAGNOSIS — N529 Male erectile dysfunction, unspecified: Secondary | ICD-10-CM

## 2021-12-19 DIAGNOSIS — R2689 Other abnormalities of gait and mobility: Secondary | ICD-10-CM

## 2021-12-19 DIAGNOSIS — N400 Enlarged prostate without lower urinary tract symptoms: Secondary | ICD-10-CM

## 2021-12-19 DIAGNOSIS — B999 Unspecified infectious disease: Secondary | ICD-10-CM

## 2021-12-19 DIAGNOSIS — Z9889 Other specified postprocedural states: Secondary | ICD-10-CM

## 2021-12-19 DIAGNOSIS — R300 Dysuria: Secondary | ICD-10-CM

## 2021-12-19 LAB — URINALYSIS MICROSCOPIC REFLEX TO CULTURE

## 2021-12-19 LAB — CBC AND DIFF
ABSOLUTE BASO COUNT: 0 K/UL (ref 0–0.20)
ABSOLUTE EOS COUNT: 0.1 K/UL (ref 0–0.45)
ABSOLUTE MONO COUNT: 0.8 K/UL — ABNORMAL HIGH (ref 0–0.80)
MDW (MONOCYTE DISTRIBUTION WIDTH): 26 — ABNORMAL HIGH (ref <20.7)
WBC COUNT: 14 K/UL — ABNORMAL HIGH (ref 4.5–11.0)

## 2021-12-19 LAB — COMPREHENSIVE METABOLIC PANEL
ANION GAP: 11 K/UL — ABNORMAL HIGH (ref 3–12)
EGFR: 59 mL/min — ABNORMAL LOW (ref >60)
SODIUM: 143 MMOL/L — ABNORMAL LOW (ref 137–147)

## 2021-12-19 LAB — URINALYSIS DIPSTICK REFLEX TO CULTURE
NITRITE: NEGATIVE U/L (ref 7–40)
URINE ASCORBIC ACID, UA: POSITIVE U/L — AB (ref 7–56)

## 2021-12-19 LAB — POC LACTATE: LACTIC ACID POC: 1.6 MMOL/L (ref 0.5–2.0)

## 2021-12-19 LAB — POC GLUCOSE: POC GLUCOSE: 182 mg/dL — ABNORMAL HIGH (ref 70–100)

## 2021-12-19 LAB — POC CREATININE, RAD: CREATININE, POC: 1.5 mg/dL — ABNORMAL HIGH (ref 0.4–1.24)

## 2021-12-19 MED ORDER — DEXTROSE 50 % IN WATER (D50W) IV SYRG
12.5-25 g | INTRAVENOUS | 0 refills | Status: DC | PRN
Start: 2021-12-19 — End: 2021-12-20

## 2021-12-19 MED ORDER — ACETAMINOPHEN 325 MG PO TAB
650 mg | ORAL | 0 refills | Status: AC | PRN
Start: 2021-12-19 — End: ?

## 2021-12-19 MED ORDER — ASPIRIN 81 MG PO TBEC
81 mg | Freq: Every day | ORAL | 0 refills | Status: AC
Start: 2021-12-19 — End: ?
  Administered 2021-12-20 – 2021-12-21 (×2): 81 mg via ORAL

## 2021-12-19 MED ORDER — INSULIN ASPART 100 UNIT/ML SC FLEXPEN
0-6 [IU] | Freq: Before meals | SUBCUTANEOUS | 0 refills | Status: AC
Start: 2021-12-19 — End: ?

## 2021-12-19 MED ORDER — OXYBUTYNIN CHLORIDE 5 MG PO TAB
5 mg | Freq: Three times a day (TID) | ORAL | 0 refills | Status: AC | PRN
Start: 2021-12-19 — End: ?
  Administered 2021-12-20 (×2): 5 mg via ORAL

## 2021-12-19 MED ORDER — DEXTROSE 50 % IN WATER (D50W) IV SYRG
12.5-25 g | INTRAVENOUS | 0 refills | Status: AC | PRN
Start: 2021-12-19 — End: ?

## 2021-12-19 MED ORDER — SODIUM CHLORIDE 0.9 % IJ SOLN
50 mL | Freq: Once | INTRAVENOUS | 0 refills | Status: CP
Start: 2021-12-19 — End: ?
  Administered 2021-12-19: 18:00:00 50 mL via INTRAVENOUS

## 2021-12-19 MED ORDER — MEROPENEM IVPB
2 g | INTRAVENOUS | 0 refills | Status: AC
Start: 2021-12-19 — End: ?
  Administered 2021-12-20 – 2021-12-22 (×16): 2 g via INTRAVENOUS

## 2021-12-19 MED ORDER — OXYCODONE 5 MG PO TAB
5 mg | ORAL | 0 refills | Status: AC | PRN
Start: 2021-12-19 — End: ?
  Administered 2021-12-20 (×3): 5 mg via ORAL

## 2021-12-19 MED ORDER — IMS MIXTURE TEMPLATE
325 ug | Freq: Every day | ORAL | 0 refills | Status: AC
Start: 2021-12-19 — End: ?
  Administered 2021-12-20 – 2021-12-24 (×9): 325 ug via ORAL

## 2021-12-19 MED ORDER — HYOSCYAMINE SULFATE 0.125 MG PO TBDI
.125 mg | SUBLINGUAL | 0 refills | Status: AC | PRN
Start: 2021-12-19 — End: ?
  Administered 2021-12-20 – 2021-12-22 (×7): 0.125 mg via SUBLINGUAL

## 2021-12-19 MED ORDER — IOHEXOL 350 MG IODINE/ML IV SOLN
100 mL | Freq: Once | INTRAVENOUS | 0 refills | Status: CP
Start: 2021-12-19 — End: ?
  Administered 2021-12-19: 18:00:00 100 mL via INTRAVENOUS

## 2021-12-19 MED ORDER — INSULIN ASPART 100 UNIT/ML SC FLEXPEN
10 [IU] | Freq: Three times a day (TID) | SUBCUTANEOUS | 0 refills | Status: AC
Start: 2021-12-19 — End: ?

## 2021-12-19 MED ORDER — LEVOTHYROXINE 150 MCG PO TAB
325 ug | Freq: Every day | ORAL | 0 refills | Status: DC
Start: 2021-12-19 — End: 2021-12-19

## 2021-12-19 MED ORDER — DUTASTERIDE 0.5 MG PO CAP
.5 mg | Freq: Every evening | ORAL | 0 refills | Status: AC
Start: 2021-12-19 — End: ?
  Administered 2021-12-20 – 2021-12-24 (×5): 0.5 mg via ORAL

## 2021-12-19 MED ORDER — BUPROPION XL 150 MG PO TB24
150 mg | Freq: Two times a day (BID) | ORAL | 0 refills | Status: AC
Start: 2021-12-19 — End: ?
  Administered 2021-12-20 – 2021-12-24 (×10): 150 mg via ORAL

## 2021-12-19 MED ORDER — METOPROLOL TARTRATE 50 MG PO TAB
50 mg | Freq: Two times a day (BID) | ORAL | 0 refills | Status: AC
Start: 2021-12-19 — End: ?
  Administered 2021-12-20 – 2021-12-24 (×10): 50 mg via ORAL

## 2021-12-19 MED ORDER — ENOXAPARIN 40 MG/0.4 ML SC SYRG
40 mg | Freq: Every day | SUBCUTANEOUS | 0 refills | Status: AC
Start: 2021-12-19 — End: ?
  Administered 2021-12-20 – 2021-12-24 (×4): 40 mg via SUBCUTANEOUS

## 2021-12-19 MED ORDER — LOSARTAN 25 MG PO TAB
12.5 mg | Freq: Every day | ORAL | 0 refills | Status: AC
Start: 2021-12-19 — End: ?
  Administered 2021-12-20 – 2021-12-22 (×3): 12.5 mg via ORAL

## 2021-12-19 MED ORDER — LEVOTHYROXINE 25 MCG PO TAB
25 ug | Freq: Every day | ORAL | 0 refills | Status: DC
Start: 2021-12-19 — End: 2021-12-19

## 2021-12-19 MED ORDER — SENNOSIDES-DOCUSATE SODIUM 8.6-50 MG PO TAB
1 | Freq: Every day | ORAL | 0 refills | Status: AC | PRN
Start: 2021-12-19 — End: ?
  Administered 2021-12-24 (×2): 1 via ORAL

## 2021-12-19 MED ORDER — FENTANYL CITRATE (PF) 50 MCG/ML IJ SOLN
50 ug | Freq: Once | INTRAVENOUS | 0 refills | Status: CP
Start: 2021-12-19 — End: ?

## 2021-12-19 MED ORDER — POLYETHYLENE GLYCOL 3350 17 GRAM PO PWPK
1 | Freq: Every day | ORAL | 0 refills | Status: AC | PRN
Start: 2021-12-19 — End: ?

## 2021-12-19 MED ORDER — SERTRALINE 100 MG PO TAB
100 mg | Freq: Every day | ORAL | 0 refills | Status: AC
Start: 2021-12-19 — End: ?
  Administered 2021-12-20 – 2021-12-24 (×5): 100 mg via ORAL

## 2021-12-19 MED ORDER — MELATONIN 5 MG PO TAB
5 mg | Freq: Every evening | ORAL | 0 refills | Status: AC | PRN
Start: 2021-12-19 — End: ?
  Administered 2021-12-22 – 2021-12-24 (×3): 5 mg via ORAL

## 2021-12-19 MED ORDER — ONDANSETRON 4 MG PO TBDI
4 mg | ORAL | 0 refills | Status: AC | PRN
Start: 2021-12-19 — End: ?
  Administered 2021-12-20: 18:00:00 4 mg via ORAL

## 2021-12-19 MED ORDER — MEROPENEM IVPB
2 g | Freq: Once | INTRAVENOUS | 0 refills | Status: CP
Start: 2021-12-19 — End: ?
  Administered 2021-12-19 (×2): 2 g via INTRAVENOUS

## 2021-12-19 MED ORDER — INSULIN GLARGINE 100 UNIT/ML (3 ML) SC INJ PEN
15 [IU] | Freq: Every evening | SUBCUTANEOUS | 0 refills | Status: AC
Start: 2021-12-19 — End: ?
  Administered 2021-12-20: 04:00:00 15 [IU] via SUBCUTANEOUS

## 2021-12-19 MED ORDER — ONDANSETRON HCL (PF) 4 MG/2 ML IJ SOLN
4 mg | INTRAVENOUS | 0 refills | Status: AC | PRN
Start: 2021-12-19 — End: ?
  Administered 2021-12-21 (×2): 4 mg via INTRAVENOUS

## 2021-12-19 MED ADMIN — FENTANYL CITRATE (PF) 50 MCG/ML IJ SOLN [3037]: 50 ug | INTRAVENOUS | @ 17:00:00 | Stop: 2021-12-19 | NDC 00409909412

## 2021-12-19 NOTE — Progress Notes
Brief Urology Update Note    66 y.o. male with obesity, HTN, HLD, DMII on insulin, metastatic thyroid cancer, hypothyroidism, GERD, arthritis, depression and complicated urologic history including BPH with small capacity bladder and recent trauma to right ureteral orifice with Foley balloon placement into the right UO s/p R PCNU placement on 06/20/21 c/b urosepsis and subcapsular hematoma followed by R PCNU removal. Patient's bladder now managed with indwelling urethral foley last exchanged 12/01/21 for urodynamics. He reported 2 days of gross hematuria that resolved after minimal manual irrigation at OSH ED yesterday. Today in [facility], patient has clear urine draining appropriately. CT with cystitis. WBC 14.2, Hgb 11.7, Cr 1.33 (baseline ~1.2). Primary contacted urology for catheter exchange in order to have urine sample.     At bedside, removed 16Fr foley. Using sterile technique, placed 18Fr foley with 10cc in balloon and return of urine. Agree with treatment of suspected UTI given history of prior MDR UTIs. Please page urology on call with questions.     Linna Hoff  Urology Resident

## 2021-12-19 NOTE — Progress Notes
Patient arrived to Urology clinic as a walk-in patient. His wife has an appointment so he wanted to see if we could assist him.    Spoke with patient, he states he has had blood in the bag of his catheter x3 days. Yesterday it was more bright red and clots, at one point it was not draining and he was urinating around the catheter. Today, urine is draining out of foley and is dark brown in color. He also states he started having sharp bilateral flank pain that at times radiates to his testicles. He started having the pain yesterday and it continues today. He did go to the ER in [place] last night. Asked if they did any imaging, he states he declined xrays. If needed, he would prefer to have them here. They were shown how to flush his catheter and provided with supplies. He did talk to one of our Urology residents last night as well. He would like to know why he is bleeding and have his pain addressed.    Let him know it is good to know how to flush the catheter. Right now, catheter is draining well. Patient is scheduled for exchange next week. Will check with one of our providers for recommendations.    Spoke with NP, recommend ER for further evaluation of the pain and bleeding. We can exchange the foley in clinic today but would not be able to address the pain in the clinic setting.     Spoke with patient/wife. Offered to exchange the catheter in clinic. They decided it would be best to go to the ER given patient continues to experience the flank pain. Let them know we will send a message to Dr. Debria Garret so he is aware of the situation. Patient already in wheelchair, wife states she is familiar with how to get to the ER. No further questions.    Note sent to Dr. Debria Garret.

## 2021-12-19 NOTE — ED Notes
Pt presents to [facility] bloody urine x2 days. Pt AOx4, denies SOA or chest pains at this time. Pt reports chronic indwelling foley catheter with history of UTI and bloody urine with obstruction previously. Pt reports seen at [facility] ED yesterday with catheter flushed which improved output, but was advised to come to [facility] ED if he began to experience pain. Pt reports initially output was bright red blood with some clots, but after flushing color has darkened. Pt denies N/V/D, Fever/chills at this time.     Belongings: Glasses, shirt, shorts, socks, shoes. Belongings at bedside with pt.

## 2021-12-19 NOTE — ED Provider Notes
Chief Complaint   Patient presents with   ? Blood in urine     Bloody urine in foley bag x2 days. Flank pain x1 day.       History of Present Illness:  Jimmy Waters is a 66 year old male with a past medical history of diabetes, hypertension, hyperlipidemia, CKD, chronic UTIs and kidney stones who presents to the ED due to blood in his urine.  He states that a couple of days ago he started seeing blood in his urine and yesterday was the worst.  Today he feels like it is water down.  He also has 9 out of 10 pain which started in the left flank region and is now in the right.  He says the blood and the pain feels similar to his previous kidney stones.  He states the blood in the pain gets really bad then it starts affecting his penis as well.  He also endorses having nausea, dysuria and fatigue.          Review of Systems:  Review of Systems   Constitutional: Positive for fatigue. Negative for appetite change and fever.   HENT: Negative for congestion and sore throat.    Respiratory: Negative for chest tightness and shortness of breath.    Cardiovascular: Negative for chest pain and palpitations.   Gastrointestinal: Positive for nausea. Negative for abdominal pain, constipation, diarrhea and vomiting.   Endocrine: Negative.    Genitourinary: Positive for dysuria, flank pain and hematuria.   Musculoskeletal: Negative for back pain and neck pain.   Allergic/Immunologic: Negative.    Neurological: Negative for light-headedness and headaches.       Allergies:  Ambien [zolpidem], Statins-hmg-coa reductase inhibitors, and Ambien [zolpidem]    Past Medical History:  Medical History:   Diagnosis Date   ? Anxiety disorder    ? Back pain    ? Barrett esophagus    ? BPH (benign prostatic hyperplasia)    ? Cancer of skin    ? Cataract    ? Chronic kidney disease unknown   ? Chronic lower back pain    ? Chronic UTI    ? Degenerative arthritis    ? Depression (disease)    ? Diverticulitis of colon (without mention of hemorrhage)(562.11)     type 2   ? DJD (degenerative joint disease)    ? DM type 2 (diabetes mellitus, type 2) (HCC)    ? Dyslipidemia    ? Erectile dysfunction 2021   ? Falls 2022   ? Gastroparesis    ? Generalized headaches 2022   ? GERD (gastroesophageal reflux disease)    ? Gynecomastia    ? History of dental problems     lower partial   ? History of Nissen fundoplication ~1990   ? HLD (hyperlipidemia)    ? HTN (hypertension)    ? Hx of arthroscopy of knee    ? Hypothyroidism    ? Infection    ? Kidney stones    ? 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 Neuropathy    ? Osteoarthritis    ? Poor balance 2022   ? Shiga toxin-producing Escherichia coli infection    ? Skin cancer    ? Squamous cell carcinoma    ? Stomach disorder    ? Thyroid cancer (HCC) 2009   ? Thyroid nodule 2009, 2017, 2022   ? Unspecified deficiency anemia 2022       Past  Surgical History:  Surgical History:   Procedure Laterality Date   ? HX LUMBAR FUSION  1982    L4-5   ? HX THYROIDECTOMY  09/2007   ? CYSTOURETHROSCOPY  2012   ? KIDNEY STONE SURGERY  2012   ? COLONOSCOPY  02/14/2011   ? HX APPENDECTOMY  02/2011   ? REPEAT LEFT LUMBAR 5 TO SACRAL 1 LAMINOTOMY N/A 01/02/2019    Performed by Karie Chimera, MD at [facility] OR   ? INCISION AND DRAINAGE POSTOPERATIVE WOUND INFECTION - COMPLEX N/A 01/20/2019    Performed by Karie [facility]imera, MD at [facility] OR   ? NEPHROSTOMY Right 04/2021    in place from right ureter from OSH   ? Revision right neck dissection Right 07/27/2021    Performed by Marlowe Aschoff, MD at [facility] OR   ? FLEXIBLE LARYNGOSCOPY DIAGNOSTIC N/A 07/27/2021    Performed by Marlowe Aschoff, MD at [facility] OR   ? OPEN TREATMENT FEMORAL SHAFT FRACTURE WITH PLATE/ SCREWS WITH/ WITHOUT CERCLAGE Right 08/13/2021    Performed by Rayford Halsted, MD at [facility] OR   ? CATARACT REMOVAL WITH IMPLANT Bilateral    ? CYSTOSCOPY     ? HX HIP REPLACEMENT Bilateral     revision of 1 hip also   ? HX KNEE ARTHROSCOPY     ? HX KNEE REPLACMENT Bilateral    ? HX LAPAROTOMY      possible colon fistula   ? HX LYMPHADENECTOMY     ? HX ROTATOR CUFF REPAIR Bilateral     [id number]   ? HX SHOULDER REPLACEMENT Bilateral    ? HX TONSILLECTOMY     ? HX VASECTOMY     ? PARATHYROID GLAND SURGERY  2009   ? THYROIDECTOMY     ? UPPER GASTROINTESTINAL ENDOSCOPY         Pertinent medical and surgical history reviewed.    Social History:  Social History     Tobacco Use   ? Smoking status: Never   ? Smokeless tobacco: Never   Vaping Use   ? Vaping Use: Never used   Substance Use Topics   ? Alcohol use: Never     Comment: ~ once a month   ? Drug use: Never     Social History     Substance and Sexual Activity   Drug Use Never             Family History:  Family History   Problem Relation Age of Onset   ? Hypertension Mother    ? Cancer-Lung Mother 67   ? Melanoma Mother    ? Cancer Mother         lung   ? Diabetes Mother    ? Heart Disease Mother    ? Hyperlipidemia Mother    ? Thyroid Disease Mother    ? Diabetes Father    ? Arthritis-osteo Sister    ? Hypertension Sister    ? Heart Disease Sister    ? Cancer Sister         lymphoma   ? Diabetes Sister    ? Arthritis Sister    ? Cancer Sister    ? Cancer-Lung Brother 80   ? Hyperlipidemia Brother    ? Hypertension Brother    ? Diabetes Brother    ? Arthritis Brother    ? Cancer Brother         lung   ? Kidney Disease Brother    ?  Cancer-Colon Maternal Aunt    ? Cancer Maternal Uncle    ? Unknown to Patient Paternal Aunt    ? Unknown to Patient Paternal Uncle    ? Unknown to Patient Maternal Grandmother    ? Unknown to Patient Maternal Grandfather    ? Unknown to Patient Paternal Grandmother    ? Unknown to Patient Paternal Grandfather    ? Cancer Sister         esophaguse   ? Diabetes Sister    ? Diabetes Brother    ? Heart Disease Sister    ? Obesity Brother        Vitals:  ED Vitals    Date and Time T BP P RR SPO2P SPO2 User   12/19/21 1530 -- 122/67 91 16 PER MINUTE 66 97 % MG   12/19/21 1500 -- 124/87 90 -- -- -- DL 16/10/96 0454 09.8 ?C (98.2 ?F) -- -- -- -- -- DL   11/91/47 8295 -- 621/30 89 -- -- -- DL   86/57/84 6962 -- -- -- -- 76 93 % DL   95/28/41 3244 -- 010/27 -- -- -- -- DL   25/36/64 4034 -- -- -- -- -- 93 % DL   74/25/95 6387 -- 564/332 -- -- 60 -- DL   95/18/84 1660 -- 630/16 77 15 PER MINUTE -- -- DL   04/03/30 3557 -- -- 78 16 PER MINUTE 76 93 % DL   32/20/25 4270 -- -- -- -- 80 93 % DL   62/37/62 8315 -- 176/16 -- -- 78 -- DL   07/37/10 6269 -- 485/46 -- -- -- -- DL   27/03/50 0938 -- -- -- -- 76 94 % DL   18/29/93 7169 -- 678/93 -- -- -- -- DL   81/01/75 1025 85.2 ?C (97.2 ?F) 103/65 83 16 PER MINUTE -- 99 % JM          Physical Exam:  Physical Exam  Constitutional:       General: He is not in acute distress.     Appearance: Normal appearance. He is not ill-appearing.   HENT:      Head: Normocephalic and atraumatic.      Mouth/Throat:      Mouth: Mucous membranes are moist.      Pharynx: Oropharynx is clear.   Eyes:      Extraocular Movements: Extraocular movements intact.      Conjunctiva/sclera: Conjunctivae normal.      Pupils: Pupils are equal, round, and reactive to light.   Cardiovascular:      Rate and Rhythm: Normal rate and regular rhythm.      Heart sounds: Normal heart sounds. No murmur heard.     No friction rub. No gallop.   Pulmonary:      Effort: No respiratory distress.      Breath sounds: Normal breath sounds. No stridor. No wheezing.   Abdominal:      General: There is no distension.      Palpations: Abdomen is soft. There is no mass.      Tenderness: There is abdominal tenderness. There is left CVA tenderness.      Comments: Right lower quadrant.   Musculoskeletal:         General: No swelling, tenderness or deformity. Normal range of motion.      Cervical back: Normal range of motion and neck supple. No rigidity or tenderness.   Lymphadenopathy:      Cervical: No cervical adenopathy.  Skin:     General: Skin is warm.      Coloration: Skin is not jaundiced.   Neurological:      General: No focal deficit present.      Mental Status: He is alert and oriented to person, place, and time.      Sensory: No sensory deficit.      Motor: No weakness.         Laboratory Results:  Labs Reviewed   CBC AND DIFF - Abnormal       Result Value Ref Range Status    White Blood Cells 14.2 (*) 4.5 - 11.0 K/UL Final    RBC 4.15 (*) 4.4 - 5.5 M/UL Final    Hemoglobin 11.7 (*) 13.5 - 16.5 GM/DL Final    Hematocrit 16.1 (*) 40 - 50 % Final    MCV 87.0  80 - 100 FL Final    MCH 28.1  26 - 34 PG Final    MCHC 32.4  32.0 - 36.0 G/DL Final    RDW 09.6 (*) 11 - 15 % Final    Platelet Count 152  150 - 400 K/UL Final    MPV 9.9  7 - 11 FL Final    Neutrophils 89 (*) 41 - 77 % Final    Lymphocytes 4 (*) 24 - 44 % Final    Monocytes 6  4 - 12 % Final    Eosinophils 1  0 - 5 % Final    Basophils 0  0 - 2 % Final    Absolute Neutrophil Count 12.50 (*) 1.8 - 7.0 K/UL Final    Absolute Lymph Count 0.63 (*) 1.0 - 4.8 K/UL Final    Absolute Monocyte Count 0.84 (*) 0 - 0.80 K/UL Final    Absolute Eosinophil Count 0.13  0 - 0.45 K/UL Final    Absolute Basophil Count 0.06  0 - 0.20 K/UL Final    MDW (Monocyte Distribution Width) 26.2 (*) <20.7 Final   COMPREHENSIVE METABOLIC PANEL - Abnormal    Sodium 143  137 - 147 MMOL/L Final    Potassium 4.0  3.5 - 5.1 MMOL/L Final    Chloride 105  98 - 110 MMOL/L Final    Glucose 170 (*) 70 - 100 MG/DL Final    Blood Urea Nitrogen 26 (*) 7 - 25 MG/DL Final    Creatinine 0.45 (*) 0.4 - 1.24 MG/DL Final    Calcium 9.8  8.5 - 10.6 MG/DL Final    Total Protein 8.1 (*) 6.0 - 8.0 G/DL Final    Total Bilirubin 0.4  0.3 - 1.2 MG/DL Final    Albumin 4.1  3.5 - 5.0 G/DL Final    Alk Phosphatase 115 (*) 25 - 110 U/L Final    AST (SGOT) 11  7 - 40 U/L Final    CO2 27  21 - 30 MMOL/L Final    ALT (SGPT) 10  7 - 56 U/L Final    Anion Gap 11  3 - 12 Final    eGFR 59 (*) >60 mL/min Final   URINALYSIS DIPSTICK REFLEX TO CULTURE - Abnormal    Color,UA AMBER   Final    Turbidity,UA 2+ (*) CLEAR-CLEAR Final    Specific Gravity-Urine 1.017  1.005 - 1.030 Final    pH,UA 6.0  5.0 - 8.0 Final    Protein,UA 2+ (*) NEG-NEG Final    Glucose,UA NEG  NEG-NEG Final    Ketones,UA NEG  NEG-NEG Final  Bilirubin,UA NEG  NEG-NEG Final    Blood,UA 3+ (*) NEG-NEG Final    Urobilinogen,UA NORMAL  NORM-NORMAL Final    Nitrite,UA NEG  NEG-NEG Final    Leukocytes,UA 3+ (*) NEG-NEG Final    Urine Ascorbic Acid, UA POS (*) NEG-NEG Final   URINALYSIS MICROSCOPIC REFLEX TO CULTURE - Abnormal    WBCs,UA PACKED  0 - 2 /HPF Final    RBCs,UA PACKED  0 - 3 /HPF Final    Comment,UA     Final    Value: Criteria for reflex to culture are WBC>10, Positive Nitrite, and/or >=+1   leukocytes. If quantity is not sufficient, an addendum will follow.      UA Reflex Specimen Type and Source     Final    Value: URINE  MIDSTREAM      MucousUA 1+   Final    Bacteria,UA MANY (*) NEG-NEG Final    WBC Clumps PRESENT   Final   POC CREATININE, RAD - Abnormal    Creatinine, POC 1.5 (*) 0.4 - 1.24 MG/DL Final   CULTURE-URINE W/SENSITIVITY   POC LACTATE    LACTIC ACID POC 1.6  0.5 - 2.0 MMOL/L Final   UA GREY TOP TUBE   URINALYSIS DIPSTICK REFLEX TO CULTURE   URINALYSIS MICROSCOPIC REFLEX TO CULTURE   UA GREY TOP TUBE   CLEAR TOP EXTRA URINE TUBE   POC GLUCOSE   POC GLUCOSE          Radiology Interpretation:    CT ABD/PELV W CONTRAST   Final Result      1.  Subcentimeter nonobstructing right renal calculus. No hydronephrosis or distal ureteral calculi.      2. Foley catheter in the decompressed urinary bladder. Suggestion of circumferential bladder wall thickening, which may be from incomplete distention or cystitis. Small gas in the urinary bladder and renal collecting systems, indeterminate but may be from the presence of the catheter. Correlation with urinalysis and signs/symptoms of ascending infection recommended.      3. No additional abdominopelvic inflammatory mass, bowel obstruction, or ascites.          Finalized by Ancil Boozer, M.D. on 12/19/2021 1:36 PM. Dictated by Ancil Boozer, M.D. on 12/19/2021 1:19 PM.               EKG:      Medical Decision Making:  DONELLE EICHNER is a 66 y.o. male who presents with chief complaint as listed above. Based on the history and presentation, the list of differential diagnoses considered included, but was not limited to, UTI, pyelonephritis, nephrolithiasis.    ED Course  66 y.o. male presented to the ED for blood in his urine.     Patient was evaluated by a resident and an attending physician. Available records were reviewed. Vitals were noted as above.    Initial labs and imaging were obtained. These included a CBC, CMP, urinalysis, CT scan of the abdomen pelvis with contrast. Patient was given fentanyl for symptomatic relief.  His CBC showed that she had an elevated white count and her CMP showed that his creatinine was 1.33.  His urinalysis showed that he had a urinary tract infection.  His lactic acid was 1.6.  His CT scan of the abdomen pelvis showed he had an ascending urinary tract infection along with a right renal calculus.  As such he was given meropenem because of his previous resistance to multiple antibiotics.  He usually requires fluoroscopy to help replace his Foley  catheter as he has a shrunken bladder and this can be done inpatient.  Medicine team was consulted for admission and the patient was admitted due to needing IV antibiotic therapy and for concerning findings on his CT scan.    Admitting team informed and updated on ED findings at time of phone handoff.     On re-evaluation, patient is in no apparent distress.     Findings discussed with patient along with the plan for admission. Patient reports understanding and is in agreement with the plan for admission. Patient reassessed with appropriate vital signs and symptom control for transfer to the floors. Patient verbalized understanding. Admission team was notified and patient was accepted to inpatient service. All questions were answered prior to transfer to inpatient service.           Complexity of Problems Addressed  Patient's active diagnoses as well as contributing pre-existing medical problems include:  Clinical Impression   Hematuria, unspecified type   Dysuria   Urinary tract infection associated with indwelling urethral catheter, initial encounter [facility])     Evaluation performed for potential threat to life or bodily function during this visit given the initial differential diagnosis and clinical impression(s) as discussed previously in MDM/ED course.    Additional data reviewed:    ? History was obtained from an independent historian: Not in addition to what is mentioned above  ? Prior non-ED notes reviewed: Not in addition to what is mentioned above  ? Independent interpretation of diagnostic tests was performed by me: Not in addition to what is mentioned above  ? Patient presentation/management was discussed with the following qualified health care professionals and/or other relevant professionals: Not in addition to what is mentioned above    Risk evaluation:    ? Diagnosis or treatment of patient condition impacted by social determinant of health: None  ? Tests Considered but not performed due to clinical scoring (if not mentioned in ED course, aside from what is implied by clinical scores listed): N/A  ? Rationale regarding whether admission or escalation of care considered if not performed (if not mentioned in ED course, aside from what is implied by clinical scores listed): N/A    ED Scoring:                                Facility Administered Meds:  Medications   aspirin EC (ASPIR-LOW) tablet 81 mg (has no administration in time range)   dutasteride (AVODART) capsule 0.5 mg (has no administration in time range)   insulin glargine (LANTUS SOLOSTAR U-100 INSULIN) injection PEN 15 Units (has no administration in time range)   levothyroxine (SYNTHROID) tablet 25 mcg (has no administration in time range)   losartan (COZAAR) tablet 12.5 mg (has no administration in time range)   buPROPion XL (WELLBUTRIN XL) tablet 150 mg (has no administration in time range)   levothyroxine (SYNTHROID) tablet 300 mcg (has no administration in time range)   metoprolol tartrate (LOPRESSOR) tablet 50 mg (has no administration in time range)   sertraline (ZOLOFT) tablet 100 mg (has no administration in time range)   dextrose 50% (D50) syringe 25-50 mL (has no administration in time range)   insulin aspart (U-100) (NOVOLOG FLEXPEN U-100 INSULIN) injection PEN 0-6 Units (has no administration in time range)   acetaminophen (TYLENOL) tablet 650 mg (has no administration in time range)   oxyCODONE (ROXICODONE) tablet 5 mg (has no administration in time range)  fentaNYL citrate PF (SUBLIMAZE) injection 50 mcg (50 mcg Intravenous Given 12/19/21 1207)   iohexoL (OMNIPAQUE-350) 350 mg/mL injection 100 mL (100 mL Intravenous Given 12/19/21 1308)   sodium chloride PF 0.9% injection 50 mL (50 mL Intravenous Given 12/19/21 1307)   meropenem (MERREM) 2 g in sodium chloride 0.9% (NS) 140 mL IVPB (2 g Intravenous Given - New Bag 12/19/21 1519)       Clinical Impression:  Clinical Impression   Hematuria, unspecified type   Dysuria   Urinary tract infection associated with indwelling urethral catheter, initial encounter [facility])       Disposition/Follow up  ED Disposition     ED Disposition   Admit           No follow-up provider specified.    Medications:  New Prescriptions    No medications on file       Procedure Notes:  Procedures           Iran Planas, MD  PGY-1, Department of Emergency Medicine   [place]: [phone]  12/19/2021 6:17 PM

## 2021-12-20 ENCOUNTER — Encounter: Admit: 2021-12-20 | Discharge: 2021-12-20 | Payer: MEDICARE

## 2021-12-20 DIAGNOSIS — C73 Malignant neoplasm of thyroid gland: Secondary | ICD-10-CM

## 2021-12-20 MED ADMIN — OXYCODONE 10 MG PO TAB [166908]: 10 mg | ORAL | @ 18:00:00 | NDC 68084096811

## 2021-12-20 MED ADMIN — OXYCODONE 10 MG PO TAB [166908]: 10 mg | ORAL | @ 23:00:00 | NDC 68084096811

## 2021-12-20 NOTE — ED Notes
Patient slightly confused. States he is in [place] and thinks "Jerrye Beavers is over there. I know she;s talking to someone I can hear her."   RN reoriented patient.   Primary team aware.   Patient cleaned up and transported to inpatient unit [facility]

## 2021-12-21 ENCOUNTER — Encounter: Admit: 2021-12-21 | Discharge: 2021-12-21 | Payer: MEDICARE

## 2021-12-21 MED ADMIN — OXYCODONE 10 MG PO TAB [166908]: 10 mg | ORAL | @ 06:00:00 | Stop: 2021-12-21 | NDC 68084096811

## 2021-12-21 MED ADMIN — OXYCODONE 10 MG PO TAB [166908]: 10 mg | ORAL | @ 12:00:00 | Stop: 2021-12-21 | NDC 68084096811

## 2021-12-21 MED ADMIN — OXYCODONE 10 MG PO TAB [166908]: 10 mg | ORAL | @ 16:00:00 | Stop: 2021-12-21 | NDC 68084096811

## 2021-12-21 MED ADMIN — OXYCODONE 15 MG PO TAB [28899]: 15 mg | ORAL | @ 22:00:00 | NDC 00406851523

## 2021-12-21 MED ADMIN — OXYCODONE 10 MG PO TAB [166908]: 10 mg | ORAL | @ 02:00:00 | NDC 68084096811

## 2021-12-22 ENCOUNTER — Inpatient Hospital Stay: Admit: 2021-12-22 | Discharge: 2021-12-22 | Payer: MEDICARE

## 2021-12-22 MED ADMIN — OXYCODONE 10 MG PO TAB [166908]: 15 mg | ORAL | @ 13:00:00 | NDC 68084096811

## 2021-12-22 MED ADMIN — OXYCODONE 10 MG PO TAB [166908]: 15 mg | ORAL | @ 23:00:00 | NDC 68084096811

## 2021-12-22 MED ADMIN — GABAPENTIN 100 MG PO CAP [18309]: 100 mg | ORAL | @ 22:00:00 | NDC 00904666561

## 2021-12-22 MED ADMIN — TAMSULOSIN 0.4 MG PO CAP [80077]: 0.4 mg | ORAL | @ 17:00:00 | NDC 68084029911

## 2021-12-22 MED ADMIN — OXYCODONE 5 MG PO TAB [10814]: 15 mg | ORAL | @ 13:00:00 | NDC 68084035411

## 2021-12-22 MED ADMIN — GADOBENATE DIMEGLUMINE 529 MG/ML (0.1MMOL/0.2ML) IV SOLN [135881]: 20 mL | INTRAVENOUS | @ 08:00:00 | Stop: 2021-12-22 | NDC 00270516415

## 2021-12-22 MED ADMIN — LORAZEPAM 2 MG/ML IJ SYRG [86485]: 1 mg | INTRAVENOUS | @ 06:00:00 | Stop: 2021-12-22 | NDC 00409198503

## 2021-12-22 MED ADMIN — DIPHEN-LIDOCAINE-MAG,AL-SIMETH 25-200-400-40 MG/30ML MM MWSH [93807]: 10 mL | ORAL | @ 19:00:00 | NDC 54029456309

## 2021-12-22 MED ADMIN — OXYCODONE 5 MG PO TAB [10814]: 15 mg | ORAL | @ 19:00:00 | NDC 68084035411

## 2021-12-22 MED ADMIN — OXYCODONE 10 MG PO TAB [166908]: 15 mg | ORAL | @ 19:00:00 | NDC 68084096811

## 2021-12-22 MED ADMIN — OXYCODONE 5 MG PO TAB [10814]: 15 mg | ORAL | @ 23:00:00 | NDC 00406055223

## 2021-12-22 MED ADMIN — OXYCODONE 10 MG PO TAB [166908]: 15 mg | ORAL | @ 02:00:00 | NDC 68084096811

## 2021-12-22 MED ADMIN — NITROFURANTOIN MONOHYD/M-CRYST 100 MG PO CAP [10724]: 100 mg | ORAL | @ 19:00:00 | Stop: 2021-12-27 | NDC 50268062511

## 2021-12-23 MED ADMIN — ATORVASTATIN 20 MG PO TAB [77412]: 20 mg | ORAL | @ 02:00:00 | NDC 00904629161

## 2021-12-23 MED ADMIN — CEFPODOXIME 200 MG PO TAB [9469]: 200 mg | ORAL | @ 17:00:00 | NDC 65862009620

## 2021-12-23 MED ADMIN — GABAPENTIN 100 MG PO CAP [18309]: 100 mg | ORAL | @ 02:00:00 | NDC 00904666561

## 2021-12-23 MED ADMIN — GABAPENTIN 100 MG PO CAP [18309]: 100 mg | ORAL | @ 18:00:00 | NDC 00904666561

## 2021-12-23 MED ADMIN — NITROFURANTOIN MONOHYD/M-CRYST 100 MG PO CAP [10724]: 100 mg | ORAL | @ 14:00:00 | Stop: 2021-12-27 | NDC 50268062511

## 2021-12-23 MED ADMIN — TAMSULOSIN 0.4 MG PO CAP [80077]: 0.4 mg | ORAL | @ 14:00:00 | NDC 68084029911

## 2021-12-23 MED ADMIN — CEFPODOXIME 200 MG PO TAB [9469]: 200 mg | ORAL | @ 23:00:00 | NDC 65862009620

## 2021-12-23 MED ADMIN — TIZANIDINE 4 MG PO TAB [14793]: 8 mg | ORAL | @ 19:00:00 | NDC 00904641861

## 2021-12-23 MED ADMIN — OXYCODONE 5 MG PO TAB [10814]: 15 mg | ORAL | @ 14:00:00 | NDC 68084035411

## 2021-12-23 MED ADMIN — OXYCODONE 5 MG PO TAB [10814]: 15 mg | ORAL | @ 19:00:00 | NDC 68084035411

## 2021-12-23 MED ADMIN — LOSARTAN 50 MG PO TAB [76938]: 25 mg | ORAL | @ 14:00:00 | NDC 68084034711

## 2021-12-23 MED ADMIN — OXYCODONE 10 MG PO TAB [166908]: 15 mg | ORAL | @ 05:00:00 | NDC 68084096811

## 2021-12-23 MED ADMIN — NITROFURANTOIN MONOHYD/M-CRYST 100 MG PO CAP [10724]: 100 mg | ORAL | @ 02:00:00 | Stop: 2021-12-27 | NDC 50268062511

## 2021-12-23 MED ADMIN — OXYCODONE 10 MG PO TAB [166908]: 15 mg | ORAL | @ 14:00:00 | NDC 68084096811

## 2021-12-23 MED ADMIN — GABAPENTIN 100 MG PO CAP [18309]: 100 mg | ORAL | @ 11:00:00 | NDC 00904666561

## 2021-12-23 MED ADMIN — OXYCODONE 10 MG PO TAB [166908]: 15 mg | ORAL | @ 19:00:00 | NDC 68084096811

## 2021-12-24 ENCOUNTER — Encounter: Admit: 2021-12-24 | Discharge: 2021-12-24 | Payer: MEDICARE

## 2021-12-24 MED ADMIN — TIZANIDINE 4 MG PO TAB [14793]: 8 mg | ORAL | @ 14:00:00 | Stop: 2021-12-24 | NDC 00904641861

## 2021-12-24 MED ADMIN — ATORVASTATIN 20 MG PO TAB [77412]: 20 mg | ORAL | @ 02:00:00 | NDC 00904629161

## 2021-12-24 MED ADMIN — NITROFURANTOIN MONOHYD/M-CRYST 100 MG PO CAP [10724]: 100 mg | ORAL | @ 02:00:00 | Stop: 2021-12-27 | NDC 50268062511

## 2021-12-24 MED ADMIN — LOSARTAN 50 MG PO TAB [76938]: 25 mg | ORAL | @ 14:00:00 | Stop: 2021-12-24 | NDC 68084034711

## 2021-12-24 MED ADMIN — OXYCODONE 10 MG PO TAB [166908]: 15 mg | ORAL | @ 11:00:00 | Stop: 2021-12-24 | NDC 68084096811

## 2021-12-24 MED ADMIN — LIDOCAINE 5 % TP PTMD [80759]: 2 | TOPICAL | @ 12:00:00 | Stop: 2021-12-24 | NDC 00591352511

## 2021-12-24 MED ADMIN — NITROFURANTOIN MONOHYD/M-CRYST 100 MG PO CAP [10724]: 100 mg | ORAL | @ 14:00:00 | Stop: 2021-12-24 | NDC 50268062511

## 2021-12-24 MED ADMIN — OXYCODONE 10 MG PO TAB [166908]: 15 mg | ORAL | @ 06:00:00 | Stop: 2021-12-24 | NDC 68084096811

## 2021-12-24 MED ADMIN — OXYCODONE 10 MG PO TAB [166908]: 15 mg | ORAL | @ 02:00:00 | NDC 68084096811

## 2021-12-24 MED ADMIN — GABAPENTIN 100 MG PO CAP [18309]: 100 mg | ORAL | @ 11:00:00 | Stop: 2021-12-24 | NDC 00904666561

## 2021-12-24 MED ADMIN — TAMSULOSIN 0.4 MG PO CAP [80077]: 0.4 mg | ORAL | @ 14:00:00 | Stop: 2021-12-24 | NDC 68084029911

## 2021-12-24 MED ADMIN — TIZANIDINE 4 MG PO TAB [14793]: 8 mg | ORAL | @ 02:00:00 | NDC 00904641861

## 2021-12-24 MED ADMIN — CAMPHOR-MENTHOL 0.5-0.5 % TP LOTN [81356]: 222.000 mL | TOPICAL | @ 14:00:00 | Stop: 2021-12-24 | NDC 00536126812

## 2021-12-24 MED ADMIN — CEFPODOXIME 200 MG PO TAB [9469]: 200 mg | ORAL | @ 13:00:00 | Stop: 2021-12-24 | NDC 65862009620

## 2021-12-24 MED ADMIN — GABAPENTIN 100 MG PO CAP [18309]: 100 mg | ORAL | @ 02:00:00 | NDC 00904666561

## 2021-12-27 ENCOUNTER — Encounter: Admit: 2021-12-27 | Discharge: 2021-12-27 | Payer: MEDICARE

## 2022-01-22 ENCOUNTER — Encounter: Admit: 2022-01-22 | Discharge: 2022-01-22 | Payer: MEDICARE

## 2022-01-22 NOTE — Telephone Encounter
Completed DWO for renewal of Dexcom and supplies from [place] and secure e-mailed for Dr Gustavus Messing to sign.  Attached last office visit note as well.

## 2022-01-22 NOTE — Telephone Encounter
Faxed signed and dated DWO and chart notes for Dexcom renewal to [facility] at [phone].Marland Kitchen

## 2022-01-26 ENCOUNTER — Ambulatory Visit
Admit: 2022-01-26 | Discharge: 2022-01-26 | Payer: MEDICARE | Primary: Student in an Organized Health Care Education/Training Program

## 2022-01-26 ENCOUNTER — Encounter
Admit: 2022-01-26 | Discharge: 2022-01-26 | Payer: MEDICARE | Primary: Student in an Organized Health Care Education/Training Program

## 2022-01-26 DIAGNOSIS — N390 Urinary tract infection, site not specified: Secondary | ICD-10-CM

## 2022-01-26 DIAGNOSIS — N319 Neuromuscular dysfunction of bladder, unspecified: Secondary | ICD-10-CM

## 2022-01-26 MED ORDER — TROSPIUM 20 MG PO TAB
20 mg | ORAL_TABLET | Freq: Two times a day (BID) | ORAL | 3 refills | 30.00000 days | Status: AC
Start: 2022-01-26 — End: ?

## 2022-01-26 MED ORDER — CRANBERRY 500 MG PO CAP
500 mg | ORAL_CAPSULE | Freq: Every day | ORAL | 3 refills | Status: AC
Start: 2022-01-26 — End: ?

## 2022-01-26 MED ORDER — D-MANNOSE 500 MG PO CAP
500 mg | ORAL_CAPSULE | Freq: Every day | ORAL | 3 refills | Status: AC
Start: 2022-01-26 — End: ?

## 2022-01-26 NOTE — Patient Instructions
~   vitamin "cocktail" ~  UTI PREVENTION:  Please take 1 gram Vitamin C, 1 gram of d-mannose and 1 cranberry pill daily.   *All of these may be bought over the counter at any pharmacy. If you prefer, you may take a d-mannose and cranberry as a combination pill, which can be purchased at [facility]*

## 2022-01-26 NOTE — Progress Notes
Patient presented to clinic for foley catheter change. Patient's old 32 Fr coude catheter was removed without difficulty. Patient was then prepped before sterile insertion of new 18 Fr coude catheter. This was placed without difficulty. 10 cc's of sterile water was inserted within balloon to secure catheter in place 60 cc of sterile water was used to flush, cloudy tinge urine return, to ensure proper placement. Patient tolerated this well and  was sent home with extra leg bags, extra overnight bag, and extra stat locks. Patient left clinic in stable condition.     Patient prefers leg bag and stat lock.

## 2022-01-26 NOTE — Progress Notes
Foley change. See note

## 2022-01-31 ENCOUNTER — Encounter
Admit: 2022-01-31 | Discharge: 2022-01-31 | Payer: MEDICARE | Primary: Student in an Organized Health Care Education/Training Program

## 2022-01-31 DIAGNOSIS — E1165 Type 2 diabetes mellitus with hyperglycemia: Secondary | ICD-10-CM

## 2022-01-31 NOTE — Telephone Encounter
Called pt to discuss MyChart message requesting an appointment with Dr. Gustavus Messing.    The pt reports, he broke his femur and in the last 6 months has lost 50 pounds.     The patient states he is currently following insulin administration orders, but his blood glucose is dropping into the 40's.     The patient states he would like an appointment with Dr.Choudhary to discuss changes to his medication, due to the weight loss and changes in blood glucose.     Encounter routed to Dr. Gustavus Messing, pt is requesting an appointment ASAP.

## 2022-02-06 ENCOUNTER — Ambulatory Visit
Admit: 2022-02-06 | Discharge: 2022-02-07 | Payer: MEDICARE | Primary: Student in an Organized Health Care Education/Training Program

## 2022-02-06 ENCOUNTER — Encounter
Admit: 2022-02-06 | Discharge: 2022-02-06 | Payer: MEDICARE | Primary: Student in an Organized Health Care Education/Training Program

## 2022-02-06 DIAGNOSIS — M549 Dorsalgia, unspecified: Secondary | ICD-10-CM

## 2022-02-06 DIAGNOSIS — M545 Chronic lower back pain: Secondary | ICD-10-CM

## 2022-02-06 DIAGNOSIS — W19XXXA Unspecified fall, initial encounter: Secondary | ICD-10-CM

## 2022-02-06 DIAGNOSIS — R519 Generalized headaches: Secondary | ICD-10-CM

## 2022-02-06 DIAGNOSIS — E041 Nontoxic single thyroid nodule: Secondary | ICD-10-CM

## 2022-02-06 DIAGNOSIS — R2689 Other abnormalities of gait and mobility: Secondary | ICD-10-CM

## 2022-02-06 DIAGNOSIS — C4490 Unspecified malignant neoplasm of skin, unspecified: Secondary | ICD-10-CM

## 2022-02-06 DIAGNOSIS — G629 Polyneuropathy, unspecified: Secondary | ICD-10-CM

## 2022-02-06 DIAGNOSIS — IMO0002 Squamous cell carcinoma: Secondary | ICD-10-CM

## 2022-02-06 DIAGNOSIS — E034 Atrophy of thyroid (acquired): Secondary | ICD-10-CM

## 2022-02-06 DIAGNOSIS — K5732 Diverticulitis of large intestine without perforation or abscess without bleeding: Secondary | ICD-10-CM

## 2022-02-06 DIAGNOSIS — K3184 Gastroparesis: Secondary | ICD-10-CM

## 2022-02-06 DIAGNOSIS — D539 Nutritional anemia, unspecified: Secondary | ICD-10-CM

## 2022-02-06 DIAGNOSIS — N529 Male erectile dysfunction, unspecified: Secondary | ICD-10-CM

## 2022-02-06 DIAGNOSIS — N400 Enlarged prostate without lower urinary tract symptoms: Secondary | ICD-10-CM

## 2022-02-06 DIAGNOSIS — Z8719 Personal history of other diseases of the digestive system: Secondary | ICD-10-CM

## 2022-02-06 DIAGNOSIS — N189 Chronic kidney disease, unspecified: Secondary | ICD-10-CM

## 2022-02-06 DIAGNOSIS — B999 Unspecified infectious disease: Secondary | ICD-10-CM

## 2022-02-06 DIAGNOSIS — N200 Calculus of kidney: Secondary | ICD-10-CM

## 2022-02-06 DIAGNOSIS — E119 Type 2 diabetes mellitus without complications: Secondary | ICD-10-CM

## 2022-02-06 DIAGNOSIS — K2270 Barrett's esophagus without dysplasia: Secondary | ICD-10-CM

## 2022-02-06 DIAGNOSIS — C73 Malignant neoplasm of thyroid gland: Secondary | ICD-10-CM

## 2022-02-06 DIAGNOSIS — K319 Disease of stomach and duodenum, unspecified: Secondary | ICD-10-CM

## 2022-02-06 DIAGNOSIS — I10 Essential (primary) hypertension: Secondary | ICD-10-CM

## 2022-02-06 DIAGNOSIS — A498 Other bacterial infections of unspecified site: Secondary | ICD-10-CM

## 2022-02-06 DIAGNOSIS — F419 Anxiety disorder, unspecified: Secondary | ICD-10-CM

## 2022-02-06 DIAGNOSIS — N390 Urinary tract infection, site not specified: Secondary | ICD-10-CM

## 2022-02-06 DIAGNOSIS — E785 Hyperlipidemia, unspecified: Secondary | ICD-10-CM

## 2022-02-06 DIAGNOSIS — E039 Hypothyroidism, unspecified: Secondary | ICD-10-CM

## 2022-02-06 DIAGNOSIS — N62 Hypertrophy of breast: Secondary | ICD-10-CM

## 2022-02-06 DIAGNOSIS — K219 Gastro-esophageal reflux disease without esophagitis: Secondary | ICD-10-CM

## 2022-02-06 DIAGNOSIS — E1165 Type 2 diabetes mellitus with hyperglycemia: Secondary | ICD-10-CM

## 2022-02-06 DIAGNOSIS — E3120 Multiple endocrine neoplasia [MEN] syndrome, unspecified: Secondary | ICD-10-CM

## 2022-02-06 DIAGNOSIS — Z9889 Other specified postprocedural states: Secondary | ICD-10-CM

## 2022-02-06 DIAGNOSIS — M1990 Unspecified osteoarthritis, unspecified site: Secondary | ICD-10-CM

## 2022-02-06 DIAGNOSIS — E890 Postprocedural hypothyroidism: Secondary | ICD-10-CM

## 2022-02-06 NOTE — Progress Notes
CGM note    Patient's Personal continuous glucose monitor Dexcom download reviewed.  At least 72 hours of data reviewed and interpreted.  Data interpreted 02/06/2022  Data interpreted from 11/1-11/14    Findings:   Average glucose 188  GMI SD 43  Glucose variability   Target glucose 42%  High 49%  Very High 8%  Low glucose < 1%    Low glucose after meals.    Continuous glucose monitor report scanned in epic.    Electronically signed by Jacqlyn Krauss, MD 02/06/2022

## 2022-02-06 NOTE — Progress Notes
Chief Complaint   Patient presents with   ? Diabetes        Date of Service: 02/06/2022    Note to patient: The 21st Century Cures Act makes medical notes like these available to patients in the interest of transparency. However, be advised this is a medical document. It is intended as peer to peer communication. It is written in medical language and may contain abbreviations or verbiage that are unfamiliar. It may appear blunt or direct. Medical documents are intended to carry relevant information, facts as evident, and the clinical opinion of the practitioner.    Jimmy Waters is a 66 y.o. male. DOB: 20-Aug-1955   MRN#: [id number]    HPI:  Seen today for thyroid cancer.  Reviewed oncology notes.  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  Jan 2018 - RAI?154 mCi?Post treatment scan showed some uptake on thyroid bed and mediastinal nodes.  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 She removed a nodule near his thyroidectomy scar. ?Pathology confirmed metastatic Papillary Thyroid Cancer.  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    07/13/2020?CT Neck w/contrast?[facility]  1. No CT evidence of a suspicious neck mass or suspicious lymphadenopathy by CT size criteria.  2. Symmetric prominence of bilateral false and true vocal cores at the level of the hyoid bone and correlate with clinical exam.  ?  07/27/21?Revision Right Central Neck Dissection.  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  11/2021: 150 mCi RAI treatment.   Low-level uptake in a left supraclavicular lymph node which could represent nodal metastasis.     He is on levothyroxine 325 mcg daily.     He has Type 2 DM for many years.  Current treatment:  Humalog 10 units with meals.  Lantus 10-20 units daily.  He had nausea with Xultophy.    He was on Trulicity in the past but it was stopped due to thyroid cancer diagnosis.  Trulicity was expensive.  He was on Jardiance but it was stopped when he developed a kidney problem. He has UTIs.    He has osteoporosis.  07/2021 Mid femoral diaphyseal fracture with progressive incomplete fracture healing > fall from standing position. Low fragility.  Bone density normal.  Drinks glass of milk at night.    Review of Systems   Constitutional: Negative for fever.   Cardiovascular: Negative for chest pain.         Objective:     ? acetaminophen (TYLENOL EXTRA STRENGTH) 500 mg tablet Take two tablets by mouth daily. Max of 4,000 mg of acetaminophen in 24 hours.   ? ascorbic acid (vitamin C) (VITAMIN C) 1,000 mg tablet Take one tablet by mouth daily.   ? atorvastatin (LIPITOR) 20 mg tablet Take one tablet by mouth daily.   ? blood-glucose meter,continuous (DEXCOM G6 RECEIVER MISC)   Dexcom G6 Receiver, See Instructions, 1 EA, Use as directed., 0 Number of Refills, 0, Route to Pharmacy Electronically, [facility], [facility] (New Address), Instructions Replace Required Details, [id number]_[id number], 185, cm, 04/25/20 11:38:00 CST, Height   ? blood-glucose sensor (DEXCOM G6 SENSOR MISC)   Dexcom G6 Sensor, See Instructions, 9 EA, Use as directed.  Change every 10 days., 3 Number of Refills, 3, Route to Pharmacy Electronically, [facility], [facility] (New Address), Instructions Replace Required Details, [id number], 185, cm, 04/25/20...   ? blood-glucose transmitter (DEXCOM G6 TRANSMITTER MISC)   Dexcom G6 Transmitter, See Instructions, 1 EA, Use as directed., 3 Number of Refills, 3, Route to Pharmacy Electronically, [facility], [facility] (New Address), Instructions Replace Required Details, [id number], 185, cm, 04/25/20 11:38:00 CST, Height   ? buPROPion HCL SR (WELLBUTRIN SR) 150 mg tablet, 12 hr sustained-release Take one tablet by mouth twice daily.   ? CHOLEcalciferoL (vitamin D3) (VITAMIN D3) 50 mcg (2,000 unit) capsule Take one capsule by mouth daily.   ? Cranberry 500 mg cap Take one capsule by mouth daily.   ? d-mannose 500 mg cap Take 500 mg by mouth daily.   ? dutasteride (AVODART) 0.5 mg capsule Take one capsule by mouth at bedtime daily.   ? ferrous sulfate 325 mg (65 mg iron) EC tablet Take one tablet by mouth daily.   ? gabapentin (NEURONTIN) 600 mg tablet Take two tablets by mouth twice daily.   ? HUMALOG KWIKPEN INSULIN 100 unit/mL subcutaneous PEN Inject eighteen Units under the skin three times daily with meals. Take with Sliding Scale: 2 units for every 50 over 150   ? hyoscyamine sulfate (LEVSIN) 0.125 mg tablet Take one tablet by mouth every 4 hours as needed for Cramps (Urinary spasms).   ? insulin glargine (LANTUS SOLOSTAR U-100 INSULIN) 100 unit/mL (3 mL) subcutaneous PEN Inject fifteen Units under the skin at bedtime daily. (Patient taking differently: Inject twenty Units under the skin at bedtime daily.)   ? levothyroxine (SYNTHROID) 150 mcg tablet Take two tablets by mouth daily 30 minutes before breakfast.   ? levothyroxine (SYNTHROID) 25 mcg tablet Take one tablet by mouth daily 30 minutes before breakfast. Take in addition to 300 mcg to make total 325 mcg daily.   ? losartan (COZAAR) 25 mg tablet Take one-half tablet by mouth daily. (Patient taking differently: Take one tablet by mouth daily.)   ? melatonin 5 mg tablet Take one tablet by mouth at bedtime daily. (Patient taking differently: Take two tablets by mouth at bedtime daily.)   ? metoprolol tartrate (LOPRESSOR) 50 mg tablet Take one tablet by mouth twice daily.   ? MULTIVITAMIN PO Take 1 tablet by mouth daily.   ? ondansetron (ZOFRAN ODT) 8 mg rapid dissolve tablet Dissolve one tablet by mouth every 8 hours as needed.   ? oxybutynin XL (DITROPAN XL) 15 mg tablet Take one tablet by mouth daily.   ? pantoprazole DR (PROTONIX) 40 mg tablet Take one tablet by mouth twice daily.   ? polyethylene glycol 3350 (MIRALAX) 17 g packet Take one packet by mouth daily as needed.   ? senna (SENOKOT) 8.6 mg tablet Take one tablet by mouth twice daily as needed.   ? sertraline (ZOLOFT) 50 mg tablet Take two tablets by mouth daily.   ? tamsulosin (FLOMAX) 0.4 mg capsule Take one capsule by mouth at bedtime daily. Do not crush, chew or open capsules. Take 30 minutes following the same meal each day.   ? trospium (SANCTURA) 20 mg tablet Take one tablet by mouth twice daily before meals. Take with water on an empty stomach, at least 1 hour before a meal.   ? zinc sulfate 220 mg (50 mg elemental zinc) capsule Take one capsule by mouth daily.     There were no vitals filed for this visit.  There is  no height or weight on file to calculate BMI.     Physical Exam  Vitals and nursing note reviewed.   Constitutional:       General: He is not in acute distress.     Appearance: Normal appearance. He is not ill-appearing, toxic-appearing or diaphoretic.   HENT:      Head: Normocephalic and atraumatic.      Nose: Nose normal.   Eyes:      Conjunctiva/sclera: Conjunctivae normal.   Neurological:      Mental Status: He is alert and oriented to person, place, and time.   Psychiatric:         Mood and Affect: Mood normal.               Lab   Thyroid Studies    Lab Results   Component Value Date/Time    TSH 230.06 (H) 12/13/2021 09:32 AM    No results found for: Edsel Petrin [facility]     Surgical Pathology   May 4th  Final Diagnosis:     A. 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 ?     B. Skeletal muscle, fibroadipose tissue, and lymph nodes (2), level 7   neck dissection: ?   Metastatic/recurrent thyroid follicular carcinoma, oncocytic (Hurthle   cell) type involving the soft tissue. ?   Lymph nodes (2): There is no evidence of malignancy. ?(0/2)     Assessment and Plan:    Raequan D. Ingwersen was seen today for diabetes.    Diagnoses and all orders for this visit:    Uncontrolled type 2 diabetes mellitus with hyperglycemia, with long-term current use of insulin (HCC)    Follicular thyroid cancer (HCC)  -     THYROGLOBULIN & THYROGLOBULIN AB; Future; Expected date: 02/06/2022    Postoperative hypothyroidism  -     TSH WITH FREE T4 REFLEX; Future; Expected date: 02/06/2022    Hypothyroidism due to acquired atrophy of thyroid    Age-related osteoporosis with current pathological fracture with routine healing  -     COMPREHENSIVE METABOLIC PANEL; Future; Expected date: 02/06/2022  -     PARATHYROID HORMONE; Future; Expected date: 02/06/2022      Papillary thyroid cancer and Hurthle cell Thyroid cancer: TTF-1 positive  PET scan shows 2 nodules in the neck.  Goal TSH to be suppressed.      Pathology indicates Hurthle cell carcinoma.  07/27/21?Revision Right Central Neck Dissection.  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  11/2021: 150 mCi RAI treatment.   Low-level uptake in a left supraclavicular lymph node which could represent nodal metastasis.     Goal TSH suppressed.  Continue levothyroxine 325 mcg daily for now.  TSH again.  Will get [place] tomorrow.    Uncontrolled type 2 diabetes: Postprandial hyperglycemia.  He had nausea with Xultophy.    He is not getting Jardiance currently.  His creatinine is high.  We will wait before resuming SGLT2 inhibitor.  Continue insulin Humalog 10 units units with meals.  Humalog or Novolog:  If blood sugar 151-200: Take extra 2 unit.  If blood sugar 201-250: Take extra 4 units.  If blood sugar 251-300: Take extra 6 units.  If blood sugar is 301-350: Take extra 8 units.  If blood sugars > 350: Take extra 10 units.    Continue insulin Lantus  20 units daily.    He has osteoporosis.  07/2021 Mid femoral diaphyseal fracture with progressive incomplete fracture healing > fall from standing position. Low fragility.  Bone density normal.  Drinks glass of milk at night.    Check BMP. If creatinine clearance ok then proceed with reclast.      45 minutes spent Discussing diabetes with the patient and wife, and making plan for him, documentation.    Electronically signed by Lucrezia Starch, MD 02/06/2022

## 2022-02-06 NOTE — Progress Notes
Obtained patient's verbal consent to treat them and their agreement to [place] financial policy and NPP via this telehealth visit during the Coronavirus Public Health Emergency

## 2022-02-07 ENCOUNTER — Encounter
Admit: 2022-02-07 | Discharge: 2022-02-07 | Payer: MEDICARE | Primary: Student in an Organized Health Care Education/Training Program

## 2022-02-07 ENCOUNTER — Ambulatory Visit
Admit: 2022-02-07 | Discharge: 2022-02-07 | Payer: MEDICARE | Primary: Student in an Organized Health Care Education/Training Program

## 2022-02-07 DIAGNOSIS — K219 Gastro-esophageal reflux disease without esophagitis: Secondary | ICD-10-CM

## 2022-02-07 DIAGNOSIS — F419 Anxiety disorder, unspecified: Secondary | ICD-10-CM

## 2022-02-07 DIAGNOSIS — M8000XD Age-related osteoporosis with current pathological fracture, unspecified site, subsequent encounter for fracture with routine healing: Secondary | ICD-10-CM

## 2022-02-07 DIAGNOSIS — C73 Malignant neoplasm of thyroid gland: Secondary | ICD-10-CM

## 2022-02-07 DIAGNOSIS — E890 Postprocedural hypothyroidism: Secondary | ICD-10-CM

## 2022-02-07 DIAGNOSIS — R591 Generalized enlarged lymph nodes: Secondary | ICD-10-CM

## 2022-02-07 DIAGNOSIS — E119 Type 2 diabetes mellitus without complications: Secondary | ICD-10-CM

## 2022-02-07 DIAGNOSIS — N62 Hypertrophy of breast: Secondary | ICD-10-CM

## 2022-02-07 DIAGNOSIS — K3184 Gastroparesis: Secondary | ICD-10-CM

## 2022-02-07 DIAGNOSIS — IMO0002 Squamous cell carcinoma: Secondary | ICD-10-CM

## 2022-02-07 DIAGNOSIS — D539 Nutritional anemia, unspecified: Secondary | ICD-10-CM

## 2022-02-07 DIAGNOSIS — K5732 Diverticulitis of large intestine without perforation or abscess without bleeding: Secondary | ICD-10-CM

## 2022-02-07 DIAGNOSIS — G629 Polyneuropathy, unspecified: Secondary | ICD-10-CM

## 2022-02-07 DIAGNOSIS — E3120 Multiple endocrine neoplasia [MEN] syndrome, unspecified: Secondary | ICD-10-CM

## 2022-02-07 DIAGNOSIS — B999 Unspecified infectious disease: Secondary | ICD-10-CM

## 2022-02-07 DIAGNOSIS — C4490 Unspecified malignant neoplasm of skin, unspecified: Secondary | ICD-10-CM

## 2022-02-07 DIAGNOSIS — N400 Enlarged prostate without lower urinary tract symptoms: Secondary | ICD-10-CM

## 2022-02-07 DIAGNOSIS — N189 Chronic kidney disease, unspecified: Secondary | ICD-10-CM

## 2022-02-07 DIAGNOSIS — N390 Urinary tract infection, site not specified: Secondary | ICD-10-CM

## 2022-02-07 DIAGNOSIS — R2689 Other abnormalities of gait and mobility: Secondary | ICD-10-CM

## 2022-02-07 DIAGNOSIS — Z8719 Personal history of other diseases of the digestive system: Secondary | ICD-10-CM

## 2022-02-07 DIAGNOSIS — E041 Nontoxic single thyroid nodule: Secondary | ICD-10-CM

## 2022-02-07 DIAGNOSIS — A498 Other bacterial infections of unspecified site: Secondary | ICD-10-CM

## 2022-02-07 DIAGNOSIS — E785 Hyperlipidemia, unspecified: Secondary | ICD-10-CM

## 2022-02-07 DIAGNOSIS — K319 Disease of stomach and duodenum, unspecified: Secondary | ICD-10-CM

## 2022-02-07 DIAGNOSIS — W19XXXA Unspecified fall, initial encounter: Secondary | ICD-10-CM

## 2022-02-07 DIAGNOSIS — M1990 Unspecified osteoarthritis, unspecified site: Secondary | ICD-10-CM

## 2022-02-07 DIAGNOSIS — N529 Male erectile dysfunction, unspecified: Secondary | ICD-10-CM

## 2022-02-07 DIAGNOSIS — M549 Dorsalgia, unspecified: Secondary | ICD-10-CM

## 2022-02-07 DIAGNOSIS — R519 Generalized headaches: Secondary | ICD-10-CM

## 2022-02-07 DIAGNOSIS — N200 Calculus of kidney: Secondary | ICD-10-CM

## 2022-02-07 DIAGNOSIS — E039 Hypothyroidism, unspecified: Secondary | ICD-10-CM

## 2022-02-07 DIAGNOSIS — K2270 Barrett's esophagus without dysplasia: Secondary | ICD-10-CM

## 2022-02-07 DIAGNOSIS — Z9889 Other specified postprocedural states: Secondary | ICD-10-CM

## 2022-02-07 DIAGNOSIS — I10 Essential (primary) hypertension: Secondary | ICD-10-CM

## 2022-02-07 DIAGNOSIS — M545 Chronic lower back pain: Secondary | ICD-10-CM

## 2022-02-07 LAB — COMPREHENSIVE METABOLIC PANEL
ALBUMIN: 4.6 g/dL (ref 3.5–5.0)
ALK PHOSPHATASE: 114 U/L — ABNORMAL HIGH (ref 25–110)
ALT: 9 U/L (ref 7–56)
ANION GAP: 14 — ABNORMAL HIGH (ref 3–12)
AST: 12 U/L (ref 7–40)
CALCIUM: 9.9 mg/dL (ref 8.5–10.6)
CO2: 23 MMOL/L (ref 21–30)
CREATININE: 2.1 mg/dL — ABNORMAL HIGH (ref 0.4–1.24)
EGFR: 34 mL/min — ABNORMAL LOW (ref >60)
POTASSIUM: 4 MMOL/L — ABNORMAL LOW (ref 3.5–5.1)
SODIUM: 139 MMOL/L (ref 137–147)
TOTAL BILIRUBIN: 0.3 mg/dL (ref 0.3–1.2)
TOTAL PROTEIN: 9.7 g/dL — ABNORMAL HIGH (ref 6.0–8.0)

## 2022-02-07 LAB — THYROGLOBULIN & THYROGLOBULIN AB
ANTI THYROGLOB ANTI: 17 [IU]/mL — ABNORMAL HIGH (ref <116)
THYROGLOBULIN: 5.7 ng/mL — ABNORMAL HIGH (ref 1.7–55.0)

## 2022-02-07 LAB — FREE T4-FREE THYROXINE

## 2022-02-07 LAB — TSH WITH FREE T4 REFLEX: TSH: 44 uU/mL — ABNORMAL HIGH (ref 0.35–5.00)

## 2022-02-07 LAB — PARATHYROID HORMONE: PTH HORMONE: 46 pg/mL (ref 10–65)

## 2022-02-07 NOTE — Progress Notes
Date of Service: 02/07/2022    Presenting Physician: Dr. Janalyn Rouse  Medical Oncologist: Dr. Gerarda Fraction  Surgeon: Dr. Susann Givens [facility]  Radiation Oncologist: Patient does not have  Endocrinology:  Dr. Gemma Payor- [facility]    Subjective:             Jimmy Waters is a 66 y.o. male.    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 He is established with Dr. Janalyn Rouse here at [facility].     Prior treatment (XRT, surgery, chemotherapy, etc.):   2009 Thyroid Cancer - Thyroidectomy, RAI - follicular and Hurthle cell features on pathology   2017 Neck Mass- 1/7 LN metastatic Thyroid Cancer?Had an extensive right neck dissection (both central and lateral into level V).    Jan 2018 -?RAI?154 mCi. 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  INTERVAL HISTORY:   Not taking synthroid consistently due to being in and out of the hospital and having trouble getting into a routine (has been hospitalized a lot for falls, low blood pressure, urosepsis, etc).    Larey Seat in the lobby before he came in due to losing his balance. Has fallen a lot lately due to imbalance.     Presents back today with baseline [place]. Doing well from a surgical standpoint.         Objective:           Vitals:    02/07/22 1504   BP: (!) 143/80   Pulse: 67   Temp: 36.1 ?C (97 ?F)   SpO2: 96%   PainSc: Four   Weight: 104.3 kg (230 lb)   Height: 185.4 cm (6' 1)     Body mass index is 30.34 kg/m?Marland Kitchen     Physical Exam  Constitutional:       Appearance: Normal appearance.   HENT:      Head: Normocephalic and atraumatic.      Nose: Nose normal.   Eyes:      Extraocular Movements: Extraocular movements intact.   Neck:      Comments: Incision healing well, cannot palpate any LAD  Pulmonary:      Effort: Pulmonary effort is normal.   Musculoskeletal:         General: Normal range of motion.      Cervical back: Normal range of motion.   Skin:     General: Skin is warm and dry.   Neurological:      General: No focal deficit present.      Mental Status: He is alert and oriented to person, place, and time. Mental status is at baseline.          Latest Reference Range & Units 03/03/21 15:28 03/30/21 09:58 07/12/21 12:17 08/12/21 00:20 10/27/21 14:15 12/13/21 09:32 02/07/22 13:55   TSH 0.35 - 5.00 MCU/ML 1.56 9.27 (H) 1.47 0.09 (L) 2.10  2.10 230.06 (H) 44.64 (H)   Thyroglobulin Ab <116 IU/mL 17  16  15 15     Thyroglobulin 1.7 - 55.0 NG/ML 35.8  85.8 (H)  1.1 (L) 1.8    (H): Data is abnormally high  (L): Data is abnormally low  [place] Head and Neck 02/07/22:    CLINICAL INDICATION: Male, 66 years; ?Hurthle cell carcinoma. Initial   thyroidectomy and radioactive iodine ablation in 2009. Repeat right   central and lateral neck dissection followed by RAI 03/2016. Cutaneous   metastatic papillary carcinoma diagnosed in 2020. Revision right neck   dissection 07/2021. Repeat RAI 11/2021. Low thyroglobulin and thyroglobulin   antibody.     TECHNIQUE: Multiple grayscale and color Doppler ultrasound images were   obtained of the anterior neck.     COMPARISON: Preoperative neck ultrasound 04/02/2021 and CT neck 04/14/2021.   Post ablation SPECT-CT 12/20/2021     FINDINGS:     Central compartment:   Right thyroid bed: Postoperative architectural distortion and surgical   clips. No nodules identified.     Left thyroid bed: Postoperative architectural distortion and surgical   clips. No nodules identified.     Lymph nodes: No enlarged or morphologically suspicious central compartment   lymph nodes.     Lateral compartments:   Right: Normal size lymph nodes are seen. ?No lymph nodes demonstrate round   shape, calcification or cystic change.     Left: Normal size lymph nodes are seen. ?No lymph nodes demonstrate round   shape, calcification or cystic change.   * ?Just posterior to the medial left clavicle, there is a subtle, ovoid   lymph node with prominent fatty hilum which demonstrates a short axis   diameter of 6 mm (best appreciated on cine images). This likely   corresponds to an area of low-grade radiotracer uptake on the WBS  12/20/2021.     IMPRESSION     1. ?Prior total thyroidectomy. No recurrent mass in the thyroid bed.   2. ?No enlarged or morphologically suspicious anterior cervical lymph   nodes. A subcentimeter left retroclavicular lymph node is seen, likely   corresponding to low-grade radiotracer uptake on post ablation imaging.   This may represent a small metastatic or reactive lymph node. This will   serve as a useful baseline for future surveillance imaging.     Pathology:   02/07/2021 ?biopsy  Left inferior anterior neck,excision  -cutaneous metastatic papillary thyroid carcinoma    Surgical Pathology   May 4th  Final Diagnosis:     A. 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 ?     B. Skeletal muscle, fibroadipose tissue, and lymph nodes (2), level 7   neck dissection: ?   Metastatic/recurrent thyroid follicular carcinoma, oncocytic (Hurthle   cell) type involving the soft tissue. ?   Lymph nodes (2): There is no evidence of malignancy. ?(0/2)   ??       Assessment and Plan:  Mr. 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 Treatment has included a total thyroidectomy in 2009 followed by a central/lateral neck recurrence in 2017. Now s/p revision right central neck dissection and adjuvant RAI in Sept 2023.     TSH very elevated and patient admits he needs to work on consistency with taking his levothyroxine. He is seeing Dr. Janalyn Rouse in December and will double down on being consistent with plans to recheck his levels with her then.   ** After clinic, his Tg is up to 5.7 (stimulated)    US shows small area in the left supraclavicular fossa (6 mm) that is indeterminate. Will  plan to continue to follow. Likely correlates with area on WBS.     RTC in 6 months to follow the left supraclavicular node and will follow up on future labwork.

## 2022-02-07 NOTE — Progress Notes
Patient here for appointment with Dr. Loletta Parish. Pt flagged as high fall risk and fall risk arm band on pt. Pt uses walker and has a leg bag in place. Pt, spouse, and MA walking to clinic from waiting room. Pt attempting to walk, talk, and adjust leg back, lost balance and fell on his right side. Pt with an abrasion to right elbow, no bleeding. Rapid response called and rapid response team arrived to assess pt. Rapid response team recommended that pt go to ED for evaluation for head and neck pain. Pt refused. Dr. Loletta Parish saw patient for appointment and also recommended pt go to the ED for evaluation but pt refused. Pt completed appointment in wheelchair. Transport team called at end of visit to transport pt to vehicle. No further incidents. Recommended using wheelchair for future visits. No opportunities for prevention at the time of fall were identified.

## 2022-02-20 ENCOUNTER — Encounter
Admit: 2022-02-20 | Discharge: 2022-02-20 | Payer: MEDICARE | Primary: Student in an Organized Health Care Education/Training Program

## 2022-02-26 ENCOUNTER — Encounter
Admit: 2022-02-26 | Discharge: 2022-02-26 | Payer: MEDICARE | Primary: Student in an Organized Health Care Education/Training Program

## 2022-02-26 ENCOUNTER — Ambulatory Visit
Admit: 2022-02-26 | Discharge: 2022-02-26 | Payer: MEDICARE | Primary: Student in an Organized Health Care Education/Training Program

## 2022-02-26 DIAGNOSIS — E1165 Type 2 diabetes mellitus with hyperglycemia: Secondary | ICD-10-CM

## 2022-02-26 DIAGNOSIS — N319 Neuromuscular dysfunction of bladder, unspecified: Secondary | ICD-10-CM

## 2022-02-26 LAB — BASIC METABOLIC PANEL
ANION GAP: 12 (ref 3–12)
BLD UREA NITROGEN: 27 mg/dL — ABNORMAL HIGH (ref 7–25)
CALCIUM: 9.7 mg/dL (ref 8.5–10.6)
CHLORIDE: 106 MMOL/L (ref 98–110)
CO2: 22 MMOL/L (ref 21–30)
CREATININE: 1.7 mg/dL — ABNORMAL HIGH (ref 0.4–1.24)
EGFR: 41 mL/min — ABNORMAL LOW (ref >60)
GLUCOSE,PANEL: 95 mg/dL (ref 70–100)
POTASSIUM: 3.9 MMOL/L (ref 3.5–5.1)
SODIUM: 140 MMOL/L (ref 137–147)

## 2022-02-26 NOTE — Progress Notes
Date of Service: 09/29/2021     Subjective:            I have reviewed this patients current past medical history, surgical history, family history, social history, allergies, and medications.    History of Present Illness    Jimmy Waters is a 66 y.o. male with obesity, HTN, HLD, DMII on insulin (A1C 7.2%), metastatic thyroid cancer, hypothyroidism, GERD, arthritis, depression and BPH with urinary retention and recurrent urosepsis, previously managed with a Foley catheter, recent RIGHT ureteral injury s/p nephroureteral drain further complicated by a subcapsular hematoma, now preadmitted to medicine with concern for a complicated UTI ahead of his scheduled 5/4 neck dissection for thyroid cancer.     He has a complicated recent urological history with [facility], summarized below:    He has a history of BPH for which he previously managed with CIC however he developed infections that he described as hematuria and dysuria after catheterization. He had a catheter placed in November 2022 that was changed every 4-6 weeks he was planning on undergoing aquablation with Dr. Willaim Bane in late 2022 though had trouble getting to his surgical date due to recurrent urosepsis. In 02/2021 he was noted to have RIGHT sided pain with hydronephrosis to the distal ureter on CT imaging though artifact from his bilateral hip replacements obscured visualization of the distal ureter. He subsequently underwent ureteroscopy of the distal ureter and retrograde pyelogram on 03/15/21 which did not demonstrate a calculus. A 6 Fr x 26 cm ureteral stent was subsequently inserted.     He was later rescheduled for R ureteroscopy, aquablation and SPT insertion with Dr. Willaim Bane on 05/18/21 for BPH, urinary retention and possible kidney stone. He later presented on 05/12/21 to the ED with RLQ abdominal pain and Foley catheter balloon inflated in the distal RIGHT ureter. The Foley was re-positioned and follow-up cystogram demonstrated a small volume bladder with contrast extravasation from the ureter at the previous site of the Foley balloon. A RIGHT nephrostomy tube was inserted for urinary diversion. This admission was complicated by MRSA UTI and bacteremia and at some point his Foley was removed. His R PNCT and ureteral stent was converted to a PCNU on 3/28 which was complicated by an 8.3 cm subcapsular hematoma, AKI and MDR E. Coli urosepsis. He ultimately discharged to inpatient rehab.     Most recently, an outpatient UA attained ahead of his upcoming thyroid cancer surgery was consistent with UTI and he was subsequently directed to the [facility] ED where he was preadmitted to medicine. His only symptom at the time of the UA collection was gross hematuria. He denied any additional LUTS, fever/chills, pain or nausea/vomiting. For the past few months he mostly voids via incontinence. CT AP w/ contrast demonstrates a non-enhancing, 0.8 cm RIGHT subcapsular fluid collection which communicates with a linear hypodensity in the anterior lower pole. His RIGHT PCNU is in good position and there is no hydronephrosis. There is mild thickening of the distal LEFT ureter. Evaluation of his pelvis is limited from artificat from his bilateral hip replacements.    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  He also endorses having an exploratory laparotomy done at some point in the past due to concern for colovesicular fistula which was ultimately negative.    He suffered a ground-level fall and May 2023 and underwent right lower extremity ORIF if this periprosthetic fracture on 08/13/2021.  He does have a history of multidrug-resistant urinary tract infections with his right-sided nephrostomy tube, infectious disease was consulted during his inpatient rehabilitation stay due to multidrug-resistant Pseudomonas for which he completed a course of meropenem on 08/20/2021.    His right percutaneous nephroureteral stent was exchanged on 07/25/2021 which was complicated by right inferior artery bleed which was coil embolized at that time as well as multiple right renal artery branches.    10/16/21: Antegrade study of R PCNU which was removed. No SPT placed due to low capacity bladder. Urethral Foley placed    10/19/21: Admitted for MDR UTI    INTERVAL HISTORY:    Jimmy Waters is a 66 y.o. male with complicated urologic history including BPH with small capacity bladder and recent trauma to right ureteral orifice with Foley balloon placement into the right UO s/p R PCNU placement on 06/20/21 c/b urosepsis and subcapsular hematoma.     He presents today for follow-up of his end-stage bladder and Foley change.    Has been having significant leakage from small capacity end stage bladder requiring multiple diapers per day.     Has Rx for Trospium but hasn't been taking it.     Labs and imaging reviewed today    Medical History:   Diagnosis Date    Anxiety disorder     Back pain     Barrett esophagus     BPH (benign prostatic hyperplasia)     Cancer of skin     Cataract     Chronic kidney disease unknown    Chronic lower back pain     Chronic UTI     Degenerative arthritis     Depression (disease)     Diverticulitis of colon (without mention of hemorrhage)(562.11)     type 2    DJD (degenerative joint disease)     DM type 2 (diabetes mellitus, type 2) (HCC)     Dyslipidemia     Erectile dysfunction 2021    Falls 2022    Gastroparesis     Generalized headaches 2022    GERD (gastroesophageal reflux disease)     Gynecomastia     History of dental problems     lower partial    History of Nissen fundoplication ~1990    HLD (hyperlipidemia)     HTN (hypertension)     Hx of arthroscopy of knee     Hypothyroidism     Infection     Kidney stones     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 2017 & 2022    Neuropathy     Osteoarthritis     Poor balance 2022    Shiga toxin-producing Escherichia coli infection     Skin cancer Squamous cell carcinoma     Stomach disorder     Thyroid cancer (HCC) 2009    Thyroid nodule 2009, 2017, 2022    Unspecified deficiency anemia 2022     Surgical History:   Procedure Laterality Date    HX LUMBAR FUSION  1982    L4-5    HX THYROIDECTOMY  09/2007    CYSTOURETHROSCOPY  2012    KIDNEY STONE SURGERY  2012    COLONOSCOPY  02/14/2011    HX APPENDECTOMY  02/2011    REPEAT LEFT LUMBAR 5 TO SACRAL 1 LAMINOTOMY N/A 01/02/2019    Performed by Karie Chimera, MD at [facility] OR    INCISION AND DRAINAGE POSTOPERATIVE WOUND INFECTION - COMPLEX N/A 01/20/2019  Performed by Karie Chimera, MD at [facility] OR    NEPHROSTOMY Right 04/2021    in place from right ureter from OSH    Revision right neck dissection Right 07/27/2021    Performed by Marlowe Aschoff, MD at [facility] OR    FLEXIBLE LARYNGOSCOPY DIAGNOSTIC N/A 07/27/2021    Performed by Marlowe Aschoff, MD at [facility] OR    OPEN TREATMENT FEMORAL SHAFT FRACTURE WITH PLATE/ SCREWS WITH/ WITHOUT CERCLAGE Right 08/13/2021    Performed by Rayford Halsted, MD at [facility] OR    CATARACT REMOVAL WITH IMPLANT Bilateral     CYSTOSCOPY      HX HIP REPLACEMENT Bilateral     revision of 1 hip also    HX KNEE ARTHROSCOPY      HX KNEE REPLACMENT Bilateral     HX LAPAROTOMY      possible colon fistula    HX LYMPHADENECTOMY      HX ROTATOR CUFF REPAIR Bilateral     2006,2007    HX SHOULDER REPLACEMENT Bilateral     HX TONSILLECTOMY      HX VASECTOMY      PARATHYROID GLAND SURGERY  2009    THYROIDECTOMY      UPPER GASTROINTESTINAL ENDOSCOPY         Objective:         Current Outpatient Medications   Medication Instructions    acetaminophen (TYLENOL EXTRA STRENGTH) 1,000 mg, Oral, DAILY, Max of 4,000 mg of acetaminophen in 24 hours.    ascorbic acid (vitamin C) (VITAMIN C) 1,000 mg, Oral, DAILY    atorvastatin (LIPITOR) 20 mg, Oral, DAILY    blood-glucose meter,continuous (DEXCOM G6 RECEIVER MISC)   Dexcom G6 Receiver, See Instructions, 1 EA, Use as directed., 0 Number of Refills, 0, Route to Pharmacy Electronically, [facility], [facility] (New Address), Instructions Replace Required Details, [id number]_[id number], 185, cm, 04/25/20 11:38:00 CST, Height    blood-glucose sensor (DEXCOM G6 SENSOR MISC)   Dexcom G6 Sensor, See Instructions, 9 EA, Use as directed.  Change every 10 days., 3 Number of Refills, 3, Route to Pharmacy Electronically, [facility], [facility] (New Address), Instructions Replace Required Details, [id number], 185, cm, 04/25/20...    blood-glucose transmitter (DEXCOM G6 TRANSMITTER MISC)   Dexcom G6 Transmitter, See Instructions, 1 EA, Use as directed., 3 Number of Refills, 3, Route to Pharmacy Electronically, [facility], [facility] (New Address), Instructions Replace Required Details, [id number]_[id number], 185, cm, 04/25/20 11:38:00 CST, Height    buPROPion HCL SR (WELLBUTRIN SR) 150 mg, Oral, TWICE DAILY    CHOLEcalciferoL (vitamin D3) (VITAMIN D3) 2,000 Units, Oral, DAILY    Cranberry 500 mg, Oral, DAILY    d-mannose 500 mg, Oral, DAILY    dutasteride (AVODART) 0.5 mg, Oral, AT BEDTIME DAILY    ferrous sulfate 325 mg, Oral, DAILY    gabapentin (NEURONTIN) 600 mg tablet 2 tablets, Oral, TWICE DAILY    HumaLOG KwikPen Insulin 18 Units, Subcutaneous, THREE TIMES DAILY WITH MEALS, Take with Sliding Scale: 2 units for every 50 over 150    hyoscyamine sulfate (LEVSIN) 0.125 mg, Oral, EVERY  4 HOURS PRN    insulin glargine (LANTUS SOLOSTAR U-100 INSULIN) 15 Units, Subcutaneous, AT BEDTIME DAILY    levothyroxine (SYNTHROID) 50 mcg, Oral, DAILY BEFORE BREAKFAST    losartan (COZAAR) 12.5 mg, Oral, DAILY    melatonin 5 mg, Oral, AT BEDTIME DAILY    metoprolol tartrate (LOPRESSOR) 50 mg, Oral, TWICE DAILY    MULTIVITAMIN PO 1 tablet, Oral,  DAILY    ondansetron (ZOFRAN ODT) 8 mg, Oral, EVERY  8 HOURS PRN    oxybutynin XL (DITROPAN XL) 15 mg, Oral, DAILY    pantoprazole DR (PROTONIX) 40 mg tablet 1 tablet, Oral, TWICE DAILY    polyethylene glycol 3350 (MIRALAX) 17 g, Oral, DAILY  PRN    senna (SENOKOT) 8.6 mg tablet 1 tablet, Oral, TWICE DAILY PRN    sertraline (ZOLOFT) 100 mg, Oral, DAILY    tamsulosin (FLOMAX) 0.4 mg, Oral, AT BEDTIME DAILY, Do not crush, chew or open capsules. Take 30 minutes following the same meal each day.    trospium (SANCTURA) 20 mg, Oral, TWICE DAILY BEFORE MEALS, Take with water on an empty stomach, at least 1 hour before a meal.    TRULICITY 0.75 mg, Subcutaneous, EVERY  7 DAYS    zinc sulfate 220 mg, Oral, DAILY     Vitals:    03/02/22 1520   BP: (!) 152/86   Pulse: 77   Temp: 36.8 ?C (98.2 ?F)   SpO2: 100%         Physical Exam:     General: Ambulates w/ walker  HEENT: normocephalic and atraumatic  Respiratory: Equal chest rise bilaterally, non labored respirations  GI: soft, nontender, nondistended, no organomegaly. Midline exploratory laparotomy scar  GU: no CVA tenderness. Urethral Foley in place.   Neuro: grossly intact moves all extremities  Psych: normal behavior, normal cognition  Extremities: no edema, no cyanosis  Skin: warm, dry, intact    IMAGING (reviewed by me):    CT CAP W/ 08/12/21:     Right PCNU in place. High density material  within the right renal collecting system. Gas within the right renal collecting system, likely due to indwelling stent. Subcapsular hematoma of  the posterior right kidney measures up to 0.7 cm (series 2 image 155).  Hypoenhancement of the inferior portion of the right kidney. No discrete  right renal laceration is identified. Left renal cysts. No evidence of  acute traumatic injury to the left kidney. No extravasation of contrast material from the opacified portions of the collecting systems and ureters bilaterally.         IR Antegrade Study 10/16/21:    No extravasation.           UDS 12/01/21: Patient underwent urodynamic studies today.  Urodynamic studies significant for small capacity bladder with terminal DO at 50 cc.  The test was repeated which did again show terminal DO at around 50 cc.  He had significant detrusor overactivity during filling. His detrusor pressure remained relatively stable during the filling phase.    Voids primarily via Valsalva.  Not obstructed on bladder contractility index or bladder outlet antibiotics.  Postvoid residual was 27 cc.    Interpretation patient has a end-stage bladder with low capacity and significant detrusor overactivity.  It does store small volumes of a safe pressure with no risk of transmission to the upper tracts however we will need to continually monitor this.    RBUS 02/26/22: No hydro. Embolization coils in RIGHT kidney. No left kidney stone     Assessment and Plan:    Jimmy Waters is a 66 y.o. male with complicated urologic history including BPH with small capacity bladder and recent trauma to right ureteral orifice with Foley balloon placement into the right UO s/p R PCNU placement on 06/20/21 c/b urosepsis and subcapsular hematoma now with R PCNU removal and urethral Foley as IR unable to place SPT due to low  capacity bladder.    Comprehensive review and analysis of external medical records, including detailed examination of all pertinent laboratory results and imaging studies, have been conducted to ensure a thorough evaluation and to inform the clinical decision-making process for this outpatient visit.    During our discussion, we reviewed the patient?s current situation and the management options available. The complications arising from the recent trauma and subsequent interventions have limited the available options, particularly in the context of the patient's small bladder capacity.    Clean intermittent catheterization (CIC), typically a preferred method for managing such conditions, is unfortunately not a feasible option for this patient due to the small bladder capacity. This method, which involves the patient self-inserting a catheter several times a day to empty the bladder, requires a certain bladder volume to be practical and effective.    Given the challenges with CIC, we discussed the ongoing management with a chronic urethral Foley catheter. While this is a practical solution, it does come with risks, such as the potential for increased urinary tract infections (UTIs) and urethral discomfort or injury over time. He also is having significant leakage due to bladder spasms    Another option considered was the placement of a suprapubic tube (SPT), but given the unsuccessful attempt by IR due to the patient?s low bladder capacity, the possibility of placing an SPT in the operating room (OR) under direct vision was discussed, imaging was reviewed and there is a small window but this would be difficult and he would likely still experience leakage.     We again discussed cystectomy and urinary diversion. His functional status has improved dramatically since his hip fracture and although it is a major operation it would provide him with the best quality of life. We discussed this and he was interested in further information from Dr. Oren Bracket who would perform the operation given his past surgical history of exploratory laparotomy for a presumed bladder rupture.    PLAN:  - Start trospium  - Refer to Dr. Oren Bracket for discussion of possible cystectomy and ileal conduit given improved functional status and poor quality of life with end stage bladder   - Follow up with me in 3 months  - Follow up in 1 month for catheter change    Orlene Och, MD  Urologic Oncology

## 2022-03-01 ENCOUNTER — Encounter
Admit: 2022-03-01 | Discharge: 2022-03-01 | Payer: MEDICARE | Primary: Student in an Organized Health Care Education/Training Program

## 2022-03-01 ENCOUNTER — Ambulatory Visit
Admit: 2022-03-01 | Discharge: 2022-03-02 | Payer: MEDICARE | Primary: Student in an Organized Health Care Education/Training Program

## 2022-03-01 ENCOUNTER — Ambulatory Visit
Admit: 2022-03-01 | Discharge: 2022-03-01 | Payer: MEDICARE | Primary: Student in an Organized Health Care Education/Training Program

## 2022-03-01 DIAGNOSIS — A498 Other bacterial infections of unspecified site: Secondary | ICD-10-CM

## 2022-03-01 DIAGNOSIS — Z8719 Personal history of other diseases of the digestive system: Secondary | ICD-10-CM

## 2022-03-01 DIAGNOSIS — N390 Urinary tract infection, site not specified: Secondary | ICD-10-CM

## 2022-03-01 DIAGNOSIS — N400 Enlarged prostate without lower urinary tract symptoms: Secondary | ICD-10-CM

## 2022-03-01 DIAGNOSIS — K5732 Diverticulitis of large intestine without perforation or abscess without bleeding: Secondary | ICD-10-CM

## 2022-03-01 DIAGNOSIS — E890 Postprocedural hypothyroidism: Secondary | ICD-10-CM

## 2022-03-01 DIAGNOSIS — B999 Unspecified infectious disease: Secondary | ICD-10-CM

## 2022-03-01 DIAGNOSIS — Z9889 Other specified postprocedural states: Secondary | ICD-10-CM

## 2022-03-01 DIAGNOSIS — M549 Dorsalgia, unspecified: Secondary | ICD-10-CM

## 2022-03-01 DIAGNOSIS — C73 Malignant neoplasm of thyroid gland: Secondary | ICD-10-CM

## 2022-03-01 DIAGNOSIS — W19XXXA Unspecified fall, initial encounter: Secondary | ICD-10-CM

## 2022-03-01 DIAGNOSIS — IMO0002 Squamous cell carcinoma: Secondary | ICD-10-CM

## 2022-03-01 DIAGNOSIS — E034 Atrophy of thyroid (acquired): Secondary | ICD-10-CM

## 2022-03-01 DIAGNOSIS — E785 Hyperlipidemia, unspecified: Secondary | ICD-10-CM

## 2022-03-01 DIAGNOSIS — E041 Nontoxic single thyroid nodule: Secondary | ICD-10-CM

## 2022-03-01 DIAGNOSIS — I10 Essential (primary) hypertension: Secondary | ICD-10-CM

## 2022-03-01 DIAGNOSIS — F419 Anxiety disorder, unspecified: Secondary | ICD-10-CM

## 2022-03-01 DIAGNOSIS — M545 Chronic lower back pain: Secondary | ICD-10-CM

## 2022-03-01 DIAGNOSIS — K2270 Barrett's esophagus without dysplasia: Secondary | ICD-10-CM

## 2022-03-01 DIAGNOSIS — R2689 Other abnormalities of gait and mobility: Secondary | ICD-10-CM

## 2022-03-01 DIAGNOSIS — K319 Disease of stomach and duodenum, unspecified: Secondary | ICD-10-CM

## 2022-03-01 DIAGNOSIS — D539 Nutritional anemia, unspecified: Secondary | ICD-10-CM

## 2022-03-01 DIAGNOSIS — C4490 Unspecified malignant neoplasm of skin, unspecified: Secondary | ICD-10-CM

## 2022-03-01 DIAGNOSIS — M1990 Unspecified osteoarthritis, unspecified site: Secondary | ICD-10-CM

## 2022-03-01 DIAGNOSIS — E1165 Type 2 diabetes mellitus with hyperglycemia: Secondary | ICD-10-CM

## 2022-03-01 DIAGNOSIS — K219 Gastro-esophageal reflux disease without esophagitis: Secondary | ICD-10-CM

## 2022-03-01 DIAGNOSIS — N189 Chronic kidney disease, unspecified: Secondary | ICD-10-CM

## 2022-03-01 DIAGNOSIS — E039 Hypothyroidism, unspecified: Secondary | ICD-10-CM

## 2022-03-01 DIAGNOSIS — G629 Polyneuropathy, unspecified: Secondary | ICD-10-CM

## 2022-03-01 DIAGNOSIS — E119 Type 2 diabetes mellitus without complications: Secondary | ICD-10-CM

## 2022-03-01 DIAGNOSIS — E3120 Multiple endocrine neoplasia [MEN] syndrome, unspecified: Secondary | ICD-10-CM

## 2022-03-01 DIAGNOSIS — N529 Male erectile dysfunction, unspecified: Secondary | ICD-10-CM

## 2022-03-01 DIAGNOSIS — K3184 Gastroparesis: Secondary | ICD-10-CM

## 2022-03-01 DIAGNOSIS — N200 Calculus of kidney: Secondary | ICD-10-CM

## 2022-03-01 DIAGNOSIS — R519 Generalized headaches: Secondary | ICD-10-CM

## 2022-03-01 DIAGNOSIS — N62 Hypertrophy of breast: Secondary | ICD-10-CM

## 2022-03-01 LAB — THYROGLOBULIN & THYROGLOBULIN AB
ANTI THYROGLOB ANTI: 15 [IU]/mL (ref <116)
THYROGLOBULIN: 2.2 ng/mL (ref 1.7–55.0)

## 2022-03-01 LAB — TSH WITH FREE T4 REFLEX: TSH: 0.5 uU/mL (ref 0.35–5.00)

## 2022-03-01 MED ORDER — TRULICITY 0.75 MG/0.5 ML SC PNIJ
.75 mg | SUBCUTANEOUS | 1 refills | Status: AC
Start: 2022-03-01 — End: ?

## 2022-03-01 NOTE — Progress Notes
CGM note    Patient's Personal continuous glucose monitor Dexcom download reviewed.  At least 72 hours of data reviewed and interpreted.  Data interpreted 03/01/2022  Data interpreted from November 8-March 01, 2022    Findings:   Average glucose 193  Standard deviation 48  GMI 7.9%  40% glucose in range  48% high  11% very high  Less than 1% low    Interpretation   Sometimes hyperglycemia and sometimes hypoglycemia after meals.    Continuous glucose monitor report scanned in epic.    Electronically signed by Jacqlyn Krauss, MD 03/01/2022

## 2022-03-01 NOTE — Progress Notes
Chief Complaint   Patient presents with    Diabetes        Date of Service: 03/01/2022    Note to patient: The 21st Century Cures Act makes medical notes like these available to patients in the interest of transparency. However, be advised this is a medical document. It is intended as peer to peer communication. It is written in medical language and may contain abbreviations or verbiage that are unfamiliar. It may appear blunt or direct. Medical documents are intended to carry relevant information, facts as evident, and the clinical opinion of the practitioner.    Jimmy Waters is a 66 y.o. male. DOB: [birth year]   MRN#: [id number]    HPI:  Seen today for thyroid cancer.  Reviewed oncology notes.  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  Jan 2018 - RAI 154 mCi Post treatment scan showed some uptake on thyroid bed and mediastinal nodes.   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 She removed a nodule near his thyroidectomy scar.  Pathology confirmed metastatic Papillary Thyroid Cancer.  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    07/13/2020 CT Neck w/contrast [facility]  1. No CT evidence of a suspicious neck mass or suspicious lymphadenopathy by CT size criteria.  2. Symmetric prominence of bilateral false and true vocal cores at the level of the hyoid bone and correlate with clinical exam.     07/27/21 Revision Right Central Neck Dissection.  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  11/2021: 150 mCi RAI treatment.   Low-level uptake in a left supraclavicular lymph node which could represent nodal metastasis.     01/2022 [place]  Left: *  Just posterior to the medial left clavicle, there is a subtle, ovoid lymph node with prominent fatty hilum which demonstrates a short axis diameter of 6 mm (best appreciated on cine images). This likely   corresponds to an area of low-grade radiotracer uptake on the SPECT-CT 12/20/2021.     He is on levothyroxine 325 mcg daily. He takes it properly on an empty stomach/    He has Type 2 DM for many years.  Current treatment:  Humalog 10 units with meals.  Lantus 10-20 units daily.  He had nausea with Xultophy.    He was on Trulicity in the past but it was stopped due to thyroid cancer diagnosis.  Trulicity was expensive.  He was on Jardiance but it was stopped when he developed a kidney problem. He has UTIs.    He has osteoporosis.  07/2021 Mid femoral diaphyseal fracture with progressive incomplete fracture healing > fall from standing position. Low fragility.  Bone density normal.  Drinks glass of milk at night.    He is getting a tooth extraction.    Review of Systems   Constitutional:  Negative for fever.   Cardiovascular:  Negative for chest pain.         Objective:      acetaminophen (TYLENOL EXTRA STRENGTH) 500 mg tablet Take two tablets by mouth daily. Max of 4,000 mg of acetaminophen in 24 hours.    ascorbic acid (vitamin C) (VITAMIN C) 1,000 mg tablet Take one tablet  by mouth daily.    atorvastatin (LIPITOR) 20 mg tablet Take one tablet by mouth daily.    blood-glucose meter,continuous (DEXCOM G6 RECEIVER MISC)   Dexcom G6 Receiver, See Instructions, 1 EA, Use as directed., 0 Number of Refills, 0, Route to Pharmacy Electronically, [facility], [facility] (New Address), Instructions Replace Required Details, [id number]_[id number], 185, cm, 04/25/20 11:38:00 CST, Height    blood-glucose sensor (DEXCOM G6 SENSOR MISC)   Dexcom G6 Sensor, See Instructions, 9 EA, Use as directed.  Change every 10 days., 3 Number of Refills, 3, Route to Pharmacy Electronically, [facility], [facility] (New Address), Instructions Replace Required Details, [id number]_[id number], 185, cm, 04/25/20...    blood-glucose transmitter (DEXCOM G6 TRANSMITTER MISC)   Dexcom G6 Transmitter, See Instructions, 1 EA, Use as directed., 3 Number of Refills, 3, Route to Pharmacy Electronically, [facility], [facility] (New Address), Instructions Replace Required Details, [id number]_[id number], 185, cm, 04/25/20 11:38:00 CST, Height    buPROPion HCL SR (WELLBUTRIN SR) 150 mg tablet, 12 hr sustained-release Take one tablet by mouth twice daily.    CHOLEcalciferoL (vitamin D3) (VITAMIN D3) 50 mcg (2,000 unit) capsule Take one capsule by mouth daily.    Cranberry 500 mg cap Take one capsule by mouth daily.    d-mannose 500 mg cap Take 500 mg by mouth daily.    dulaglutide (TRULICITY) 0.75 mg/0.5 mL injection pen Inject 0.5 mL under the skin every 7 days.    dutasteride (AVODART) 0.5 mg capsule Take one capsule by mouth at bedtime daily.    ferrous sulfate 325 mg (65 mg iron) EC tablet Take one tablet by mouth daily.    gabapentin (NEURONTIN) 600 mg tablet Take two tablets by mouth twice daily.    HUMALOG KWIKPEN INSULIN 100 unit/mL subcutaneous PEN Inject eighteen Units under the skin three times daily with meals. Take with Sliding Scale: 2 units for every 50 over 150    hyoscyamine sulfate (LEVSIN) 0.125 mg tablet Take one tablet by mouth every 4 hours as needed for Cramps (Urinary spasms).    insulin glargine (LANTUS SOLOSTAR U-100 INSULIN) 100 unit/mL (3 mL) subcutaneous PEN Inject fifteen Units under the skin at bedtime daily. (Patient taking differently: Inject twenty Units under the skin at bedtime daily.)    levothyroxine (SYNTHROID) 25 mcg tablet Take one tablet by mouth daily 30 minutes before breakfast. Take in addition to 300 mcg to make total 325 mcg daily.    losartan (COZAAR) 25 mg tablet Take one-half tablet by mouth daily. (Patient taking differently: Take one tablet by mouth daily.)    melatonin 5 mg tablet Take one tablet by mouth at bedtime daily. (Patient taking differently: Take two tablets by mouth at bedtime daily.)    metoprolol tartrate (LOPRESSOR) 50 mg tablet Take one tablet by mouth twice daily.    MULTIVITAMIN PO Take 1 tablet by mouth daily.    ondansetron (ZOFRAN ODT) 8 mg rapid dissolve tablet Dissolve one tablet by mouth every 8 hours as needed.    oxybutynin XL (DITROPAN XL) 15 mg tablet Take one tablet by mouth daily.    pantoprazole DR (PROTONIX) 40 mg tablet Take one tablet by mouth twice daily.    polyethylene glycol 3350 (MIRALAX) 17 g packet Take one packet by mouth daily as needed.    senna (SENOKOT) 8.6 mg tablet Take one tablet by mouth twice daily as needed.    sertraline (ZOLOFT) 50 mg tablet Take two tablets by mouth daily.    tamsulosin (FLOMAX) 0.4 mg capsule Take one  capsule by mouth at bedtime daily. Do not crush, chew or open capsules. Take 30 minutes following the same meal each day.    trospium (SANCTURA) 20 mg tablet Take one tablet by mouth twice daily before meals. Take with water on an empty stomach, at least 1 hour before a meal.    zinc sulfate 220 mg (50 mg elemental zinc) capsule Take one capsule by mouth daily.     Vitals:    03/01/22 1511   BP: (!) 141/71   BP Source: Arm, Right Upper   Pulse: 81   Temp: 36.5 ?C (97.7 ?F)   Resp: 16   SpO2: 100%   TempSrc: Temporal   PainSc: Zero   Weight: 104.3 kg (230 lb)   Height: 182.9 cm (6')     Body mass index is 31.19 kg/m?Marland Kitchen     Physical Exam  Vitals and nursing note reviewed.   Constitutional:       General: He is not in acute distress.     Appearance: Normal appearance. He is not ill-appearing, toxic-appearing or diaphoretic.   HENT:      Head: Normocephalic and atraumatic.      Nose: Nose normal.   Eyes:      Conjunctiva/sclera: Conjunctivae normal.   Neurological:      Mental Status: He is alert and oriented to person, place, and time.   Psychiatric:         Mood and Affect: Mood normal.               Lab   Thyroid Studies    Lab Results   Component Value Date/Time    TSH 44.64 (H) 02/07/2022 01:55 PM    No results found for: Edsel Petrin [facility]     Surgical Pathology   May 4th  Final Diagnosis:     A. 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       B. Skeletal muscle, fibroadipose tissue, and lymph nodes (2), level 7   neck dissection:     Metastatic/recurrent thyroid follicular carcinoma, oncocytic (Hurthle   cell) type involving the soft tissue.     Lymph nodes (2): There is no evidence of malignancy.  (0/2)     Assessment and Plan:    Marrell D. Merwin was seen today for diabetes.    Diagnoses and all orders for this visit:    Uncontrolled type 2 diabetes mellitus with hyperglycemia, with long-term current use of insulin (HCC)    Stage 4 chronic kidney disease (HCC)  -     AMB REFERRAL TO NEPHROLOGY    Thyroid cancer (HCC)  -     THYROGLOBULIN & THYROGLOBULIN AB; Future; Expected date: 03/01/2022    Postoperative hypothyroidism  -     TSH WITH FREE T4 REFLEX; Future; Expected date: 03/01/2022    Hypothyroidism due to acquired atrophy of thyroid    Other orders  -     dulaglutide (TRULICITY) 0.75 mg/0.5 mL injection pen; Inject 0.5 mL under the skin every 7 days.      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  Goal TSH to be suppressed.      Pathology indicates Hurthle cell carcinoma.  07/27/21 Revision Right Central Neck Dissection.  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  11/2021: 150 mCi RAI treatment.   Low-level uptake in a left supraclavicular lymph node which could represent nodal metastasis.     Goal TSH suppressed.  Continue levothyroxine 325 mcg daily for now.  TSH again.  Plan for ultrasound in spring 2024.    Uncontrolled type 2 diabetes: Postprandial hyperglycemia.  He had nausea with Xultophy.    He is not getting Jardiance currently.  His creatinine is high.  We will wait before resuming SGLT2 inhibitor.  Will see if Trulicity is still expensive.  Will send prescription for Trulicity.  Increase Trulicity on a monthly basis if tolerating well.  Decrease Humalog and Lantus gradually if Trulicity is working well.  Decrease insulin Humalog to 8 units units with meals.  Take only 5 units if eating a small meal.  Humalog or Novolog:  If blood sugar 151-200: Take extra 2 unit.  If blood sugar 201-250: Take extra 4 units.  If blood sugar 251-300: Take extra 6 units.  If blood sugar is 301-350: Take extra 8 units.  If blood sugars > 350: Take extra 10 units.    Continue insulin Lantus 20 units daily.  Refer to nephrology for chronic kidney disease.    He has osteoporosis.  07/2021 Mid femoral diaphyseal fracture with progressive incomplete fracture healing > fall from standing position. Low fragility.  Bone density normal.      Check BMP. If creatinine clearance ok then proceed with reclast.  If kidney function is down then consider Prolia.  He is planning to get dental extraction soon.  Will wait for the dental extraction, site to heal well before starting osteoporosis medicine.  PTH was normal.  He has good calcium intake in diet.      45 minutes spent Discussing diabetes with the patient and wife, and making plan for him, documentation.    Electronically signed by Lucrezia Starch, MD 03/01/2022

## 2022-03-02 ENCOUNTER — Encounter
Admit: 2022-03-02 | Discharge: 2022-03-02 | Payer: MEDICARE | Primary: Student in an Organized Health Care Education/Training Program

## 2022-03-02 ENCOUNTER — Ambulatory Visit
Admit: 2022-03-02 | Discharge: 2022-03-02 | Payer: MEDICARE | Primary: Student in an Organized Health Care Education/Training Program

## 2022-03-02 DIAGNOSIS — IMO0002 Squamous cell carcinoma: Secondary | ICD-10-CM

## 2022-03-02 DIAGNOSIS — N319 Neuromuscular dysfunction of bladder, unspecified: Secondary | ICD-10-CM

## 2022-03-02 DIAGNOSIS — N390 Urinary tract infection, site not specified: Secondary | ICD-10-CM

## 2022-03-02 DIAGNOSIS — E890 Postprocedural hypothyroidism: Secondary | ICD-10-CM

## 2022-03-02 DIAGNOSIS — Z8719 Personal history of other diseases of the digestive system: Secondary | ICD-10-CM

## 2022-03-02 DIAGNOSIS — N529 Male erectile dysfunction, unspecified: Secondary | ICD-10-CM

## 2022-03-02 DIAGNOSIS — K3184 Gastroparesis: Secondary | ICD-10-CM

## 2022-03-02 DIAGNOSIS — C4490 Unspecified malignant neoplasm of skin, unspecified: Secondary | ICD-10-CM

## 2022-03-02 DIAGNOSIS — E039 Hypothyroidism, unspecified: Secondary | ICD-10-CM

## 2022-03-02 DIAGNOSIS — R2689 Other abnormalities of gait and mobility: Secondary | ICD-10-CM

## 2022-03-02 DIAGNOSIS — E041 Nontoxic single thyroid nodule: Secondary | ICD-10-CM

## 2022-03-02 DIAGNOSIS — M1990 Unspecified osteoarthritis, unspecified site: Secondary | ICD-10-CM

## 2022-03-02 DIAGNOSIS — M549 Dorsalgia, unspecified: Secondary | ICD-10-CM

## 2022-03-02 DIAGNOSIS — N62 Hypertrophy of breast: Secondary | ICD-10-CM

## 2022-03-02 DIAGNOSIS — I10 Essential (primary) hypertension: Secondary | ICD-10-CM

## 2022-03-02 DIAGNOSIS — C73 Malignant neoplasm of thyroid gland: Secondary | ICD-10-CM

## 2022-03-02 DIAGNOSIS — R519 Generalized headaches: Secondary | ICD-10-CM

## 2022-03-02 DIAGNOSIS — N184 Chronic kidney disease, stage 4 (severe): Secondary | ICD-10-CM

## 2022-03-02 DIAGNOSIS — W19XXXA Unspecified fall, initial encounter: Secondary | ICD-10-CM

## 2022-03-02 DIAGNOSIS — N400 Enlarged prostate without lower urinary tract symptoms: Secondary | ICD-10-CM

## 2022-03-02 DIAGNOSIS — N189 Chronic kidney disease, unspecified: Secondary | ICD-10-CM

## 2022-03-02 DIAGNOSIS — K319 Disease of stomach and duodenum, unspecified: Secondary | ICD-10-CM

## 2022-03-02 DIAGNOSIS — Z9889 Other specified postprocedural states: Secondary | ICD-10-CM

## 2022-03-02 DIAGNOSIS — E119 Type 2 diabetes mellitus without complications: Secondary | ICD-10-CM

## 2022-03-02 DIAGNOSIS — D539 Nutritional anemia, unspecified: Secondary | ICD-10-CM

## 2022-03-02 DIAGNOSIS — A498 Other bacterial infections of unspecified site: Secondary | ICD-10-CM

## 2022-03-02 DIAGNOSIS — R310 Gross hematuria: Secondary | ICD-10-CM

## 2022-03-02 DIAGNOSIS — K219 Gastro-esophageal reflux disease without esophagitis: Secondary | ICD-10-CM

## 2022-03-02 DIAGNOSIS — K2270 Barrett's esophagus without dysplasia: Secondary | ICD-10-CM

## 2022-03-02 DIAGNOSIS — E3120 Multiple endocrine neoplasia [MEN] syndrome, unspecified: Secondary | ICD-10-CM

## 2022-03-02 DIAGNOSIS — F419 Anxiety disorder, unspecified: Secondary | ICD-10-CM

## 2022-03-02 DIAGNOSIS — N200 Calculus of kidney: Secondary | ICD-10-CM

## 2022-03-02 DIAGNOSIS — K5732 Diverticulitis of large intestine without perforation or abscess without bleeding: Secondary | ICD-10-CM

## 2022-03-02 DIAGNOSIS — M545 Chronic lower back pain: Secondary | ICD-10-CM

## 2022-03-02 DIAGNOSIS — E785 Hyperlipidemia, unspecified: Secondary | ICD-10-CM

## 2022-03-02 DIAGNOSIS — B999 Unspecified infectious disease: Secondary | ICD-10-CM

## 2022-03-02 DIAGNOSIS — G629 Polyneuropathy, unspecified: Secondary | ICD-10-CM

## 2022-03-02 MED ORDER — LEVOTHYROXINE 50 MCG PO TAB
50 ug | ORAL_TABLET | Freq: Every day | ORAL | 3 refills | 30.00000 days | Status: AC
Start: 2022-03-02 — End: ?

## 2022-03-05 ENCOUNTER — Encounter
Admit: 2022-03-05 | Discharge: 2022-03-05 | Payer: MEDICARE | Primary: Student in an Organized Health Care Education/Training Program

## 2022-03-05 MED ORDER — LEVOTHYROXINE 150 MCG PO TAB
300 ug | ORAL_TABLET | Freq: Every day | ORAL | 3 refills | 30.00000 days | Status: DC
Start: 2022-03-05 — End: 2022-03-05

## 2022-03-27 ENCOUNTER — Encounter
Admit: 2022-03-27 | Discharge: 2022-03-27 | Payer: MEDICARE | Primary: Student in an Organized Health Care Education/Training Program

## 2022-03-27 DIAGNOSIS — E1165 Type 2 diabetes mellitus with hyperglycemia: Secondary | ICD-10-CM

## 2022-03-27 NOTE — Telephone Encounter
Duplicate encounter. Addressed concern in other MyChart message sent today.

## 2022-04-03 ENCOUNTER — Encounter
Admit: 2022-04-03 | Discharge: 2022-04-03 | Payer: MEDICARE | Primary: Student in an Organized Health Care Education/Training Program

## 2022-04-03 NOTE — Telephone Encounter
Patient's wife called to follow up. They had a urine culture performed locally that showed he had a UTI that is resistant to oral medications. The internist they saw would like to discuss with Dr Debria Garret. Dr Ward Chatters [phone] regarding next steps and recommendations.

## 2022-04-12 ENCOUNTER — Encounter
Admit: 2022-04-12 | Discharge: 2022-04-12 | Payer: MEDICARE | Primary: Student in an Organized Health Care Education/Training Program

## 2022-04-12 DIAGNOSIS — E1165 Type 2 diabetes mellitus with hyperglycemia: Secondary | ICD-10-CM

## 2022-04-17 ENCOUNTER — Encounter
Admit: 2022-04-17 | Discharge: 2022-04-17 | Payer: MEDICARE | Primary: Student in an Organized Health Care Education/Training Program

## 2022-04-17 DIAGNOSIS — C4490 Unspecified malignant neoplasm of skin, unspecified: Secondary | ICD-10-CM

## 2022-04-17 DIAGNOSIS — R2689 Other abnormalities of gait and mobility: Secondary | ICD-10-CM

## 2022-04-17 DIAGNOSIS — A498 Other bacterial infections of unspecified site: Secondary | ICD-10-CM

## 2022-04-17 DIAGNOSIS — N529 Male erectile dysfunction, unspecified: Secondary | ICD-10-CM

## 2022-04-17 DIAGNOSIS — K5732 Diverticulitis of large intestine without perforation or abscess without bleeding: Secondary | ICD-10-CM

## 2022-04-17 DIAGNOSIS — Z9889 Other specified postprocedural states: Secondary | ICD-10-CM

## 2022-04-17 DIAGNOSIS — M549 Dorsalgia, unspecified: Secondary | ICD-10-CM

## 2022-04-17 DIAGNOSIS — K219 Gastro-esophageal reflux disease without esophagitis: Secondary | ICD-10-CM

## 2022-04-17 DIAGNOSIS — W19XXXA Unspecified fall, initial encounter: Secondary | ICD-10-CM

## 2022-04-17 DIAGNOSIS — E039 Hypothyroidism, unspecified: Secondary | ICD-10-CM

## 2022-04-17 DIAGNOSIS — B999 Unspecified infectious disease: Secondary | ICD-10-CM

## 2022-04-17 DIAGNOSIS — N400 Enlarged prostate without lower urinary tract symptoms: Secondary | ICD-10-CM

## 2022-04-17 DIAGNOSIS — IMO0002 Squamous cell carcinoma: Secondary | ICD-10-CM

## 2022-04-17 DIAGNOSIS — R519 Generalized headaches: Secondary | ICD-10-CM

## 2022-04-17 DIAGNOSIS — N200 Calculus of kidney: Secondary | ICD-10-CM

## 2022-04-17 DIAGNOSIS — F419 Anxiety disorder, unspecified: Secondary | ICD-10-CM

## 2022-04-17 DIAGNOSIS — E041 Nontoxic single thyroid nodule: Secondary | ICD-10-CM

## 2022-04-17 DIAGNOSIS — E3120 Multiple endocrine neoplasia [MEN] syndrome, unspecified: Secondary | ICD-10-CM

## 2022-04-17 DIAGNOSIS — N62 Hypertrophy of breast: Secondary | ICD-10-CM

## 2022-04-17 DIAGNOSIS — M545 Chronic lower back pain: Secondary | ICD-10-CM

## 2022-04-17 DIAGNOSIS — N390 Urinary tract infection, site not specified: Secondary | ICD-10-CM

## 2022-04-17 DIAGNOSIS — M1990 Unspecified osteoarthritis, unspecified site: Secondary | ICD-10-CM

## 2022-04-17 DIAGNOSIS — I10 Essential (primary) hypertension: Secondary | ICD-10-CM

## 2022-04-17 DIAGNOSIS — K2270 Barrett's esophagus without dysplasia: Secondary | ICD-10-CM

## 2022-04-17 DIAGNOSIS — Z8719 Personal history of other diseases of the digestive system: Secondary | ICD-10-CM

## 2022-04-17 DIAGNOSIS — K319 Disease of stomach and duodenum, unspecified: Secondary | ICD-10-CM

## 2022-04-17 DIAGNOSIS — E785 Hyperlipidemia, unspecified: Secondary | ICD-10-CM

## 2022-04-17 DIAGNOSIS — D539 Nutritional anemia, unspecified: Secondary | ICD-10-CM

## 2022-04-17 DIAGNOSIS — E119 Type 2 diabetes mellitus without complications: Secondary | ICD-10-CM

## 2022-04-17 DIAGNOSIS — N189 Chronic kidney disease, unspecified: Secondary | ICD-10-CM

## 2022-04-17 DIAGNOSIS — K3184 Gastroparesis: Secondary | ICD-10-CM

## 2022-04-17 DIAGNOSIS — C73 Malignant neoplasm of thyroid gland: Secondary | ICD-10-CM

## 2022-04-17 DIAGNOSIS — G629 Polyneuropathy, unspecified: Secondary | ICD-10-CM

## 2022-04-25 ENCOUNTER — Encounter
Admit: 2022-04-25 | Discharge: 2022-04-25 | Payer: MEDICARE | Primary: Student in an Organized Health Care Education/Training Program

## 2022-04-25 DIAGNOSIS — E1165 Type 2 diabetes mellitus with hyperglycemia: Secondary | ICD-10-CM

## 2022-04-25 NOTE — Telephone Encounter
Pt's spouse Jerrye Beavers calling for Ilia to share surgical decision he has opted for. Pt wants to proceed with cystectomy. RN advised his decision would be passed along to Dr. Randa Lynn and Wilburn Cornelia, Administrative Assistant who will work to schedule patient for procedure. Jerrye Beavers verbalized understanding of plan.

## 2022-04-26 ENCOUNTER — Encounter
Admit: 2022-04-26 | Discharge: 2022-04-26 | Payer: MEDICARE | Primary: Student in an Organized Health Care Education/Training Program

## 2022-04-26 ENCOUNTER — Ambulatory Visit
Admit: 2022-04-26 | Discharge: 2022-04-27 | Payer: MEDICARE | Primary: Student in an Organized Health Care Education/Training Program

## 2022-04-26 DIAGNOSIS — N529 Male erectile dysfunction, unspecified: Secondary | ICD-10-CM

## 2022-04-26 DIAGNOSIS — M549 Dorsalgia, unspecified: Secondary | ICD-10-CM

## 2022-04-26 DIAGNOSIS — C4490 Unspecified malignant neoplasm of skin, unspecified: Secondary | ICD-10-CM

## 2022-04-26 DIAGNOSIS — R2689 Other abnormalities of gait and mobility: Secondary | ICD-10-CM

## 2022-04-26 DIAGNOSIS — Z9889 Other specified postprocedural states: Secondary | ICD-10-CM

## 2022-04-26 DIAGNOSIS — E1165 Type 2 diabetes mellitus with hyperglycemia: Secondary | ICD-10-CM

## 2022-04-26 DIAGNOSIS — K319 Disease of stomach and duodenum, unspecified: Secondary | ICD-10-CM

## 2022-04-26 DIAGNOSIS — A498 Other bacterial infections of unspecified site: Secondary | ICD-10-CM

## 2022-04-26 DIAGNOSIS — IMO0002 Squamous cell carcinoma: Secondary | ICD-10-CM

## 2022-04-26 DIAGNOSIS — I10 Essential (primary) hypertension: Secondary | ICD-10-CM

## 2022-04-26 DIAGNOSIS — N189 Chronic kidney disease, unspecified: Secondary | ICD-10-CM

## 2022-04-26 DIAGNOSIS — W19XXXA Unspecified fall, initial encounter: Secondary | ICD-10-CM

## 2022-04-26 DIAGNOSIS — C73 Malignant neoplasm of thyroid gland: Secondary | ICD-10-CM

## 2022-04-26 DIAGNOSIS — E034 Atrophy of thyroid (acquired): Secondary | ICD-10-CM

## 2022-04-26 DIAGNOSIS — K3184 Gastroparesis: Secondary | ICD-10-CM

## 2022-04-26 DIAGNOSIS — E785 Hyperlipidemia, unspecified: Secondary | ICD-10-CM

## 2022-04-26 DIAGNOSIS — E119 Type 2 diabetes mellitus without complications: Secondary | ICD-10-CM

## 2022-04-26 DIAGNOSIS — E3120 Multiple endocrine neoplasia [MEN] syndrome, unspecified: Secondary | ICD-10-CM

## 2022-04-26 DIAGNOSIS — G629 Polyneuropathy, unspecified: Secondary | ICD-10-CM

## 2022-04-26 DIAGNOSIS — K219 Gastro-esophageal reflux disease without esophagitis: Secondary | ICD-10-CM

## 2022-04-26 DIAGNOSIS — B999 Unspecified infectious disease: Secondary | ICD-10-CM

## 2022-04-26 DIAGNOSIS — Z8719 Personal history of other diseases of the digestive system: Secondary | ICD-10-CM

## 2022-04-26 DIAGNOSIS — E039 Hypothyroidism, unspecified: Secondary | ICD-10-CM

## 2022-04-26 DIAGNOSIS — D539 Nutritional anemia, unspecified: Secondary | ICD-10-CM

## 2022-04-26 DIAGNOSIS — M1990 Unspecified osteoarthritis, unspecified site: Secondary | ICD-10-CM

## 2022-04-26 DIAGNOSIS — K5732 Diverticulitis of large intestine without perforation or abscess without bleeding: Secondary | ICD-10-CM

## 2022-04-26 DIAGNOSIS — M545 Chronic lower back pain: Secondary | ICD-10-CM

## 2022-04-26 DIAGNOSIS — F419 Anxiety disorder, unspecified: Secondary | ICD-10-CM

## 2022-04-26 DIAGNOSIS — N400 Enlarged prostate without lower urinary tract symptoms: Secondary | ICD-10-CM

## 2022-04-26 DIAGNOSIS — K2270 Barrett's esophagus without dysplasia: Secondary | ICD-10-CM

## 2022-04-26 DIAGNOSIS — N390 Urinary tract infection, site not specified: Secondary | ICD-10-CM

## 2022-04-26 DIAGNOSIS — R519 Generalized headaches: Secondary | ICD-10-CM

## 2022-04-26 DIAGNOSIS — N200 Calculus of kidney: Secondary | ICD-10-CM

## 2022-04-26 DIAGNOSIS — N62 Hypertrophy of breast: Secondary | ICD-10-CM

## 2022-04-26 DIAGNOSIS — E041 Nontoxic single thyroid nodule: Secondary | ICD-10-CM

## 2022-04-26 MED ORDER — ZOLEDRONIC ACID-MANNITOL-WATER 5 MG/100 ML IV PGBK
5 mg | Freq: Once | INTRAVENOUS | 0 refills
Start: 2022-04-26 — End: ?

## 2022-04-26 MED ORDER — LEVOTHYROXINE 50 MCG PO TAB
50 ug | ORAL_TABLET | Freq: Every day | ORAL | 3 refills | 30.00000 days | Status: AC
Start: 2022-04-26 — End: ?

## 2022-04-26 MED ORDER — GVOKE HYPOPEN 2-PACK 1 MG/0.2 ML SC ATIN
ORAL | 1 refills | 16.00000 days | Status: AC
Start: 2022-04-26 — End: ?

## 2022-04-26 MED ORDER — LEVOTHYROXINE 300 MCG PO TAB
300 ug | ORAL_TABLET | Freq: Every day | ORAL | 1 refills | 30.00000 days | Status: AC
Start: 2022-04-26 — End: ?

## 2022-04-26 MED ORDER — SOLIQUA 100/33 100 UNIT-33 MCG/ML SC INPN
25 [IU] | Freq: Every day | SUBCUTANEOUS | 1 refills | Status: AC
Start: 2022-04-26 — End: ?

## 2022-04-26 NOTE — Progress Notes
CGM note    Patient's Personal continuous glucose monitor dexcom download reviewed.  At least 72 hours of data reviewed and interpreted.  Data interpreted 04/26/2022  Data interpreted from 1/19 to 04/26/22    Findings:   Average glucose 208  Standard deviation 59  GMI 8.3%  29% glucose in range  47% hide  23% very high  Less than 1% low  0% very low    Interpretation Global hyperglycemia.    Continuous glucose monitor report scanned in epic.    Electronically signed by Jacqlyn Krauss, MD 04/26/2022

## 2022-04-26 NOTE — Progress Notes
Chief Complaint   Patient presents with    Diabetes    Hypothyroidism        Date of Service: 04/26/2022    Note to patient: The 21st Century Cures Act makes medical notes like these available to patients in the interest of transparency. However, be advised this is a medical document. It is intended as peer to peer communication. It is written in medical language and may contain abbreviations or verbiage that are unfamiliar. It may appear blunt or direct. Medical documents are intended to carry relevant information, facts as evident, and the clinical opinion of the practitioner.    Jimmy Waters is a 67 y.o. male. DOB: Jul 22, 1955   MRN#: [id number]    HPI:  Seen today for thyroid cancer.  Reviewed oncology notes.  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  Jan 2018 - RAI 154 mCi Post treatment scan showed some uptake on thyroid bed and mediastinal nodes.   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 She removed a nodule near his thyroidectomy scar.  Pathology confirmed metastatic Papillary Thyroid Cancer.  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    07/13/2020 CT Neck w/contrast [facility]  1. No CT evidence of a suspicious neck mass or suspicious lymphadenopathy by CT size criteria.  2. Symmetric prominence of bilateral false and true vocal cores at the level of the hyoid bone and correlate with clinical exam.     07/27/21 Revision Right Central Neck Dissection.  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  11/2021: 150 mCi RAI treatment.   Low-level uptake in a left supraclavicular lymph node which could represent nodal metastasis. 01/2022 [place]  Left: *  Just posterior to the medial left clavicle, there is a subtle, ovoid lymph node with prominent fatty hilum which demonstrates a short axis diameter of 6 mm (best appreciated on cine images). This likely   corresponds to an area of low-grade radiotracer uptake on the SPECT-CT 12/20/2021.     He is on levothyroxine 350 mcg daily.  He is not very compliant with the medicine.  Does not take it on an empty stomach in morning.    He has Type 2 DM for many years.  Current treatment:  Humalog 10 units with meals.  Lantus 10-20 units daily.  He had nausea with Xultophy.    He was on Trulicity in the past but it was stopped due to thyroid cancer diagnosis.  Trulicity was expensive.  He was on Jardiance but it was stopped when he developed a kidney problem. He has UTIs.    He has osteoporosis.  07/2021 Mid femoral diaphyseal fracture with progressive incomplete fracture healing > fall from standing position. Low fragility.  Bone density normal.  Drinks glass of milk at night.    He is getting a tooth extraction.    Review of Systems   Constitutional:  Negative for fever.   Cardiovascular:  Negative for chest pain.         Objective:      acetaminophen (TYLENOL EXTRA STRENGTH) 500 mg tablet Take two tablets by mouth daily. Max of 4,000 mg of acetaminophen in 24 hours.  ascorbic acid (vitamin C) (VITAMIN C) 1,000 mg tablet Take one tablet by mouth daily.    atorvastatin (LIPITOR) 20 mg tablet Take one tablet by mouth daily.    blood-glucose meter,continuous (DEXCOM G6 RECEIVER MISC)   Dexcom G6 Receiver, See Instructions, 1 EA, Use as directed., 0 Number of Refills, 0, Route to Pharmacy Electronically, [facility], [facility] (New Address), Instructions Replace Required Details, [id number]_[id number], 185, cm, 04/25/20 11:38:00 CST, Height    blood-glucose sensor (DEXCOM G6 SENSOR MISC)   Dexcom G6 Sensor, See Instructions, 9 EA, Use as directed.  Change every 10 days., 3 Number of Refills, 3, Route to Pharmacy Electronically, [facility], [facility] (New Address), Instructions Replace Required Details, NCPDP_[id number], 185, cm, 04/25/20...    blood-glucose transmitter (DEXCOM G6 TRANSMITTER MISC)   Dexcom G6 Transmitter, See Instructions, 1 EA, Use as directed., 3 Number of Refills, 3, Route to Pharmacy Electronically, [facility], [facility] (New Address), Instructions Replace Required Details, [id number]_[id number], 185, cm, 04/25/20 11:38:00 CST, Height    buPROPion HCL SR (WELLBUTRIN SR) 150 mg tablet, 12 hr sustained-release Take one tablet by mouth twice daily.    CHOLEcalciferoL (vitamin D3) (VITAMIN D3) 50 mcg (2,000 unit) capsule Take one capsule by mouth daily.    Cranberry 500 mg cap Take one capsule by mouth daily.    d-mannose 500 mg cap Take 500 mg by mouth daily.    dulaglutide (TRULICITY) 0.75 mg/0.5 mL injection pen Inject 0.5 mL under the skin every 7 days.    dutasteride (AVODART) 0.5 mg capsule Take one capsule by mouth at bedtime daily.    ferrous sulfate 325 mg (65 mg iron) EC tablet Take one tablet by mouth daily.    gabapentin (NEURONTIN) 600 mg tablet Take two tablets by mouth twice daily.    glucagon (GVOKE HYPOPEN 2-PACK) 1 mg/0.2 mL syringe Inject 1 mg into the upper arm, thigh, or buttocks if needed for severe hypoglycemia and then call 911.    HUMALOG KWIKPEN INSULIN 100 unit/mL subcutaneous PEN Inject eighteen Units under the skin three times daily with meals. Take with Sliding Scale: 2 units for every 50 over 150    hyoscyamine sulfate (LEVSIN) 0.125 mg tablet Take one tablet by mouth every 4 hours as needed for Cramps (Urinary spasms).    insulin degludec-liraglutide (XULTOPHY) 100 unit-3.6 mg /mL (3 mL) injection PEN Inject  under the skin.    insulin glargine (LANTUS SOLOSTAR U-100 INSULIN) 100 unit/mL (3 mL) subcutaneous PEN Inject fifteen Units under the skin at bedtime daily. (Patient taking differently: Inject twenty Units under the skin at bedtime daily.)    insulin glargine-lixisenatide (SOLIQUA 100/33) 100 unit-33 mcg/mL syringe Inject twenty five Units under the skin daily.    levothyroxine (SYNTHROID) 300 mcg tablet Take one tablet by mouth daily 30 minutes before breakfast. Indications: a condition with low thyroid hormone levels    levothyroxine (SYNTHROID) 50 mcg tablet Take one tablet by mouth daily 30 minutes before breakfast.    losartan (COZAAR) 25 mg tablet Take one-half tablet by mouth daily. (Patient taking differently: Take one tablet by mouth daily.)    melatonin 5 mg tablet Take one tablet by mouth at bedtime daily. (Patient taking differently: Take two tablets by mouth at bedtime daily.)    metoprolol tartrate (LOPRESSOR) 50 mg tablet Take one tablet by mouth twice daily.    MULTIVITAMIN PO Take 1 tablet by mouth daily.    ondansetron (ZOFRAN ODT) 8 mg rapid dissolve tablet Dissolve one tablet by mouth every 8  hours as needed.    oxybutynin XL (DITROPAN XL) 15 mg tablet Take one tablet by mouth daily.    pantoprazole DR (PROTONIX) 40 mg tablet Take one tablet by mouth twice daily.    polyethylene glycol 3350 (MIRALAX) 17 g packet Take one packet by mouth daily as needed.    senna (SENOKOT) 8.6 mg tablet Take one tablet by mouth twice daily as needed.    sertraline (ZOLOFT) 50 mg tablet Take two tablets by mouth daily.    tamsulosin (FLOMAX) 0.4 mg capsule Take one capsule by mouth at bedtime daily. Do not crush, chew or open capsules. Take 30 minutes following the same meal each day.    trospium (SANCTURA) 20 mg tablet Take one tablet by mouth twice daily before meals. Take with water on an empty stomach, at least 1 hour before a meal.    zinc sulfate 220 mg (50 mg elemental zinc) capsule Take one capsule by mouth daily.     Vitals:    04/26/22 1106   BP: (!) 157/82   BP Source: Arm, Right Upper   Pulse: 80   Temp: 36.3 ?C (97.3 ?F)   Resp: 16   SpO2: 100%   PainSc: Zero   Weight: 104.3 kg (230 lb)   Height: 185.4 cm (6' 1)     Body mass index is 30.34 kg/m?Marland Kitchen     Physical Exam  Vitals and nursing note reviewed.   Constitutional:       General: He is not in acute distress.     Appearance: Normal appearance. He is not ill-appearing, toxic-appearing or diaphoretic.   HENT:      Head: Normocephalic and atraumatic.      Nose: Nose normal.   Eyes:      Conjunctiva/sclera: Conjunctivae normal.   Neurological:      Mental Status: He is alert and oriented to person, place, and time.   Psychiatric:         Mood and Affect: Mood normal.               Lab   Thyroid Studies    Lab Results   Component Value Date/Time    TSH 0.56 03/01/2022 03:43 PM    No results found for: Edsel Petrin [facility]     Surgical Pathology   May 4th  Final Diagnosis:     A. 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       B. Skeletal muscle, fibroadipose tissue, and lymph nodes (2), level 7   neck dissection:     Metastatic/recurrent thyroid follicular carcinoma, oncocytic (Hurthle   cell) type involving the soft tissue.     Lymph nodes (2): There is no evidence of malignancy.  (0/2)     Assessment and Plan:    Jimmy Waters was seen today for diabetes and hypothyroidism.    Diagnoses and all orders for this visit:    Uncontrolled type 2 diabetes mellitus with hyperglycemia, with long-term current use of insulin (HCC)    Hypothyroidism due to acquired atrophy of thyroid  -     TSH WITH FREE T4 REFLEX; Future; Expected date: 05/28/2022    Papillary thyroid carcinoma (HCC)  -     TSH WITH FREE T4 REFLEX; Future; Expected date: 05/28/2022  -     THYROGLOBULIN & THYROGLOBULIN AB; Future; Expected date: 05/28/2022  Thyroid cancer (HCC)  -     levothyroxine (SYNTHROID) 300 mcg tablet; Take one tablet by mouth daily 30 minutes before breakfast. Indications: a condition with low thyroid hormone levels    Postoperative hypothyroidism  -     levothyroxine (SYNTHROID) 300 mcg tablet; Take one tablet by mouth daily 30 minutes before breakfast. Indications: a condition with low thyroid hormone levels    Other orders  -     zoledronic acid/mannitol/water (RECLAST) 5mg /100 mL IVPB  -     insulin glargine-lixisenatide (SOLIQUA 100/33) 100 unit-33 mcg/mL syringe; Inject twenty five Units under the skin daily.  -     levothyroxine (SYNTHROID) 50 mcg tablet; Take one tablet by mouth daily 30 minutes before breakfast.  -     glucagon (GVOKE HYPOPEN 2-PACK) 1 mg/0.2 mL syringe; Inject 1 mg into the upper arm, thigh, or buttocks if needed for severe hypoglycemia and then call 911.      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  Goal TSH to be suppressed.      Pathology indicates Hurthle cell carcinoma.  07/27/21 Revision Right Central Neck Dissection.  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  11/2021: 150 mCi RAI treatment.   Low-level uptake in a left supraclavicular lymph node which could represent nodal metastasis.     Goal TSH suppressed.    He is not very compliant with levothyroxine and does not take it on an empty stomach in morning.  Discussed compliance with him.  Continue levothyroxine 350 mcg daily for now.  TSH again in March.  Plan for ultrasound in spring 2024.    Uncontrolled type 2 diabetes: Diet is not healthy.  He is drinking Pepsi.  Discussed diet in detail.  He is giving too much insulin for correction.  Discussed insulin again with him.    He had nausea with Xultophy.    He is not getting Jardiance currently.  His creatinine is high and he is getting a bladder procedure.  We will wait before resuming SGLT2 inhibitor.  Trulicity was expensive before.    Take NovoLog 8 units units with meals.  Take only 5 units if eating a small meal.  Humalog or Novolog:  If blood sugar 151-200: Take extra 2 unit.  If blood sugar 201-250: Take extra 4 units.  If blood sugar 251-300: Take extra 6 units.  If blood sugar is 301-350: Take extra 8 units.  If blood sugars > 350: Take extra 10 units.    Change insulin Lantus to Soliqua 25 units daily.  Please decrease the insulin if your diet is healthier and you stop drinking Pepsi.    He has osteoporosis.  07/2021 Mid femoral diaphyseal fracture with progressive incomplete fracture healing > fall from standing position. Low fragility.  Bone density normal.    Will order Reclast infusion.  Reclast infusion belongs to a class of medicine called bisphosphonates.  We recommend patients to take 2 tablets of Tylenol before infusion and 2 tablets of Tylenol after infusion as sometimes flulike illness can happen.  Bisphosphonates can rarely cause osteonecrosis of jaw when patients get dental procedures like extraction or dental implants.  Please let your dentist know that you are on these medicines if you are planning to get any dental procedure.  Very rarely it can cause atypical fracture of femur.  Please let us know if you experience thigh pain at any  time.        45 minutes spent Discussing diabetes with the patient and wife, and making plan for him, documentation.    Electronically signed by Lucrezia Starch, MD 04/26/2022

## 2022-04-27 DIAGNOSIS — C73 Malignant neoplasm of thyroid gland: Secondary | ICD-10-CM

## 2022-04-27 DIAGNOSIS — E890 Postprocedural hypothyroidism: Secondary | ICD-10-CM

## 2022-05-01 ENCOUNTER — Encounter
Admit: 2022-05-01 | Discharge: 2022-05-01 | Payer: MEDICARE | Primary: Student in an Organized Health Care Education/Training Program

## 2022-05-01 DIAGNOSIS — R339 Retention of urine, unspecified: Secondary | ICD-10-CM

## 2022-05-01 MED ORDER — HEPARIN, PORCINE (PF) 5,000 UNIT/0.5 ML IJ SYRG
5000 [IU] | Freq: Once | SUBCUTANEOUS | 0 refills
Start: 2022-05-01 — End: ?

## 2022-05-01 MED ORDER — CEFOXITIN INJ 2GM IVP
2 g | Freq: Once | INTRAVENOUS | 0 refills
Start: 2022-05-01 — End: ?

## 2022-05-01 MED ORDER — ALVIMOPAN 12 MG PO CAP
12 mg | Freq: Once | ORAL | 0 refills
Start: 2022-05-01 — End: ?

## 2022-05-02 ENCOUNTER — Ambulatory Visit
Admit: 2022-05-02 | Discharge: 2022-05-02 | Payer: MEDICARE | Primary: Student in an Organized Health Care Education/Training Program

## 2022-05-02 ENCOUNTER — Encounter
Admit: 2022-05-02 | Discharge: 2022-05-02 | Payer: MEDICARE | Primary: Student in an Organized Health Care Education/Training Program

## 2022-05-02 ENCOUNTER — Inpatient Hospital Stay
Admit: 2022-05-02 | Discharge: 2022-05-02 | Payer: MEDICARE | Primary: Student in an Organized Health Care Education/Training Program

## 2022-05-02 DIAGNOSIS — B999 Unspecified infectious disease: Secondary | ICD-10-CM

## 2022-05-02 DIAGNOSIS — C73 Malignant neoplasm of thyroid gland: Secondary | ICD-10-CM

## 2022-05-02 DIAGNOSIS — Z9889 Other specified postprocedural states: Secondary | ICD-10-CM

## 2022-05-02 DIAGNOSIS — A498 Other bacterial infections of unspecified site: Secondary | ICD-10-CM

## 2022-05-02 DIAGNOSIS — E041 Nontoxic single thyroid nodule: Secondary | ICD-10-CM

## 2022-05-02 DIAGNOSIS — Z8719 Personal history of other diseases of the digestive system: Secondary | ICD-10-CM

## 2022-05-02 DIAGNOSIS — I10 Essential (primary) hypertension: Secondary | ICD-10-CM

## 2022-05-02 DIAGNOSIS — K319 Disease of stomach and duodenum, unspecified: Secondary | ICD-10-CM

## 2022-05-02 DIAGNOSIS — N529 Male erectile dysfunction, unspecified: Secondary | ICD-10-CM

## 2022-05-02 DIAGNOSIS — K219 Gastro-esophageal reflux disease without esophagitis: Secondary | ICD-10-CM

## 2022-05-02 DIAGNOSIS — R519 Generalized headaches: Secondary | ICD-10-CM

## 2022-05-02 DIAGNOSIS — R339 Retention of urine, unspecified: Secondary | ICD-10-CM

## 2022-05-02 DIAGNOSIS — M549 Dorsalgia, unspecified: Secondary | ICD-10-CM

## 2022-05-02 DIAGNOSIS — E3120 Multiple endocrine neoplasia [MEN] syndrome, unspecified: Secondary | ICD-10-CM

## 2022-05-02 DIAGNOSIS — Z978 Presence of other specified devices: Secondary | ICD-10-CM

## 2022-05-02 DIAGNOSIS — D539 Nutritional anemia, unspecified: Secondary | ICD-10-CM

## 2022-05-02 DIAGNOSIS — W19XXXA Unspecified fall, initial encounter: Secondary | ICD-10-CM

## 2022-05-02 DIAGNOSIS — E039 Hypothyroidism, unspecified: Secondary | ICD-10-CM

## 2022-05-02 DIAGNOSIS — N390 Urinary tract infection, site not specified: Secondary | ICD-10-CM

## 2022-05-02 DIAGNOSIS — N400 Enlarged prostate without lower urinary tract symptoms: Secondary | ICD-10-CM

## 2022-05-02 DIAGNOSIS — F419 Anxiety disorder, unspecified: Secondary | ICD-10-CM

## 2022-05-02 DIAGNOSIS — G629 Polyneuropathy, unspecified: Secondary | ICD-10-CM

## 2022-05-02 DIAGNOSIS — E785 Hyperlipidemia, unspecified: Secondary | ICD-10-CM

## 2022-05-02 DIAGNOSIS — C4490 Unspecified malignant neoplasm of skin, unspecified: Secondary | ICD-10-CM

## 2022-05-02 DIAGNOSIS — N189 Chronic kidney disease, unspecified: Secondary | ICD-10-CM

## 2022-05-02 DIAGNOSIS — M1990 Unspecified osteoarthritis, unspecified site: Secondary | ICD-10-CM

## 2022-05-02 DIAGNOSIS — IMO0002 Squamous cell carcinoma: Secondary | ICD-10-CM

## 2022-05-02 DIAGNOSIS — R2689 Other abnormalities of gait and mobility: Secondary | ICD-10-CM

## 2022-05-02 DIAGNOSIS — E119 Type 2 diabetes mellitus without complications: Secondary | ICD-10-CM

## 2022-05-02 DIAGNOSIS — M545 Chronic lower back pain: Secondary | ICD-10-CM

## 2022-05-02 DIAGNOSIS — N62 Hypertrophy of breast: Secondary | ICD-10-CM

## 2022-05-02 DIAGNOSIS — K5732 Diverticulitis of large intestine without perforation or abscess without bleeding: Secondary | ICD-10-CM

## 2022-05-02 DIAGNOSIS — K3184 Gastroparesis: Secondary | ICD-10-CM

## 2022-05-02 DIAGNOSIS — K2270 Barrett's esophagus without dysplasia: Secondary | ICD-10-CM

## 2022-05-02 DIAGNOSIS — N200 Calculus of kidney: Secondary | ICD-10-CM

## 2022-05-02 NOTE — Telephone Encounter
Spoke with pt and wife regarding pre-operative instructions on the telephone.  No questions at this time.  Will send instruction sheet as MyChart message.  They verbalized understanding and agreement.  They confirmed 06/11/22 would be an appropriate surgery date for pt.

## 2022-05-03 ENCOUNTER — Encounter
Admit: 2022-05-03 | Discharge: 2022-05-03 | Payer: MEDICARE | Primary: Student in an Organized Health Care Education/Training Program

## 2022-05-04 ENCOUNTER — Ambulatory Visit
Admit: 2022-05-04 | Discharge: 2022-05-05 | Payer: MEDICARE | Primary: Student in an Organized Health Care Education/Training Program

## 2022-05-04 ENCOUNTER — Encounter
Admit: 2022-05-04 | Discharge: 2022-05-04 | Payer: MEDICARE | Primary: Student in an Organized Health Care Education/Training Program

## 2022-05-04 DIAGNOSIS — Z8719 Personal history of other diseases of the digestive system: Secondary | ICD-10-CM

## 2022-05-04 DIAGNOSIS — Z9889 Other specified postprocedural states: Secondary | ICD-10-CM

## 2022-05-04 DIAGNOSIS — D539 Nutritional anemia, unspecified: Secondary | ICD-10-CM

## 2022-05-04 DIAGNOSIS — R519 Generalized headaches: Secondary | ICD-10-CM

## 2022-05-04 DIAGNOSIS — I10 Essential (primary) hypertension: Secondary | ICD-10-CM

## 2022-05-04 DIAGNOSIS — F419 Anxiety disorder, unspecified: Secondary | ICD-10-CM

## 2022-05-04 DIAGNOSIS — E041 Nontoxic single thyroid nodule: Secondary | ICD-10-CM

## 2022-05-04 DIAGNOSIS — A498 Other bacterial infections of unspecified site: Secondary | ICD-10-CM

## 2022-05-04 DIAGNOSIS — E785 Hyperlipidemia, unspecified: Secondary | ICD-10-CM

## 2022-05-04 DIAGNOSIS — C4490 Unspecified malignant neoplasm of skin, unspecified: Secondary | ICD-10-CM

## 2022-05-04 DIAGNOSIS — W19XXXA Unspecified fall, initial encounter: Secondary | ICD-10-CM

## 2022-05-04 DIAGNOSIS — K5732 Diverticulitis of large intestine without perforation or abscess without bleeding: Secondary | ICD-10-CM

## 2022-05-04 DIAGNOSIS — N189 Chronic kidney disease, unspecified: Secondary | ICD-10-CM

## 2022-05-04 DIAGNOSIS — N390 Urinary tract infection, site not specified: Secondary | ICD-10-CM

## 2022-05-04 DIAGNOSIS — E119 Type 2 diabetes mellitus without complications: Secondary | ICD-10-CM

## 2022-05-04 DIAGNOSIS — K3184 Gastroparesis: Secondary | ICD-10-CM

## 2022-05-04 DIAGNOSIS — K319 Disease of stomach and duodenum, unspecified: Secondary | ICD-10-CM

## 2022-05-04 DIAGNOSIS — B999 Unspecified infectious disease: Secondary | ICD-10-CM

## 2022-05-04 DIAGNOSIS — E3120 Multiple endocrine neoplasia [MEN] syndrome, unspecified: Secondary | ICD-10-CM

## 2022-05-04 DIAGNOSIS — N200 Calculus of kidney: Secondary | ICD-10-CM

## 2022-05-04 DIAGNOSIS — E039 Hypothyroidism, unspecified: Secondary | ICD-10-CM

## 2022-05-04 DIAGNOSIS — K2270 Barrett's esophagus without dysplasia: Secondary | ICD-10-CM

## 2022-05-04 DIAGNOSIS — N400 Enlarged prostate without lower urinary tract symptoms: Secondary | ICD-10-CM

## 2022-05-04 DIAGNOSIS — M545 Chronic lower back pain: Secondary | ICD-10-CM

## 2022-05-04 DIAGNOSIS — C73 Malignant neoplasm of thyroid gland: Secondary | ICD-10-CM

## 2022-05-04 DIAGNOSIS — N529 Male erectile dysfunction, unspecified: Secondary | ICD-10-CM

## 2022-05-04 DIAGNOSIS — M1990 Unspecified osteoarthritis, unspecified site: Secondary | ICD-10-CM

## 2022-05-04 DIAGNOSIS — N62 Hypertrophy of breast: Secondary | ICD-10-CM

## 2022-05-04 DIAGNOSIS — M549 Dorsalgia, unspecified: Secondary | ICD-10-CM

## 2022-05-04 DIAGNOSIS — G629 Polyneuropathy, unspecified: Secondary | ICD-10-CM

## 2022-05-04 DIAGNOSIS — IMO0002 Squamous cell carcinoma: Secondary | ICD-10-CM

## 2022-05-04 DIAGNOSIS — R2689 Other abnormalities of gait and mobility: Secondary | ICD-10-CM

## 2022-05-04 DIAGNOSIS — K219 Gastro-esophageal reflux disease without esophagitis: Secondary | ICD-10-CM

## 2022-05-05 DIAGNOSIS — N184 Chronic kidney disease, stage 4 (severe): Secondary | ICD-10-CM

## 2022-05-06 ENCOUNTER — Encounter
Admit: 2022-05-06 | Discharge: 2022-05-06 | Payer: MEDICARE | Primary: Student in an Organized Health Care Education/Training Program

## 2022-05-06 DIAGNOSIS — K3184 Gastroparesis: Secondary | ICD-10-CM

## 2022-05-06 DIAGNOSIS — Z8719 Personal history of other diseases of the digestive system: Secondary | ICD-10-CM

## 2022-05-06 DIAGNOSIS — B999 Unspecified infectious disease: Secondary | ICD-10-CM

## 2022-05-06 DIAGNOSIS — R519 Generalized headaches: Secondary | ICD-10-CM

## 2022-05-06 DIAGNOSIS — K2270 Barrett's esophagus without dysplasia: Secondary | ICD-10-CM

## 2022-05-06 DIAGNOSIS — Z9889 Other specified postprocedural states: Secondary | ICD-10-CM

## 2022-05-06 DIAGNOSIS — E039 Hypothyroidism, unspecified: Secondary | ICD-10-CM

## 2022-05-06 DIAGNOSIS — M545 Chronic lower back pain: Secondary | ICD-10-CM

## 2022-05-06 DIAGNOSIS — K319 Disease of stomach and duodenum, unspecified: Secondary | ICD-10-CM

## 2022-05-06 DIAGNOSIS — F419 Anxiety disorder, unspecified: Secondary | ICD-10-CM

## 2022-05-06 DIAGNOSIS — C4490 Unspecified malignant neoplasm of skin, unspecified: Secondary | ICD-10-CM

## 2022-05-06 DIAGNOSIS — IMO0002 Squamous cell carcinoma: Secondary | ICD-10-CM

## 2022-05-06 DIAGNOSIS — N400 Enlarged prostate without lower urinary tract symptoms: Secondary | ICD-10-CM

## 2022-05-06 DIAGNOSIS — C73 Malignant neoplasm of thyroid gland: Secondary | ICD-10-CM

## 2022-05-06 DIAGNOSIS — K5732 Diverticulitis of large intestine without perforation or abscess without bleeding: Secondary | ICD-10-CM

## 2022-05-06 DIAGNOSIS — D539 Nutritional anemia, unspecified: Secondary | ICD-10-CM

## 2022-05-06 DIAGNOSIS — N62 Hypertrophy of breast: Secondary | ICD-10-CM

## 2022-05-06 DIAGNOSIS — E041 Nontoxic single thyroid nodule: Secondary | ICD-10-CM

## 2022-05-06 DIAGNOSIS — G629 Polyneuropathy, unspecified: Secondary | ICD-10-CM

## 2022-05-06 DIAGNOSIS — E119 Type 2 diabetes mellitus without complications: Secondary | ICD-10-CM

## 2022-05-06 DIAGNOSIS — A498 Other bacterial infections of unspecified site: Secondary | ICD-10-CM

## 2022-05-06 DIAGNOSIS — N529 Male erectile dysfunction, unspecified: Secondary | ICD-10-CM

## 2022-05-06 DIAGNOSIS — E3120 Multiple endocrine neoplasia [MEN] syndrome, unspecified: Secondary | ICD-10-CM

## 2022-05-06 DIAGNOSIS — M549 Dorsalgia, unspecified: Secondary | ICD-10-CM

## 2022-05-06 DIAGNOSIS — M1990 Unspecified osteoarthritis, unspecified site: Secondary | ICD-10-CM

## 2022-05-06 DIAGNOSIS — N200 Calculus of kidney: Secondary | ICD-10-CM

## 2022-05-06 DIAGNOSIS — N390 Urinary tract infection, site not specified: Secondary | ICD-10-CM

## 2022-05-06 DIAGNOSIS — R2689 Other abnormalities of gait and mobility: Secondary | ICD-10-CM

## 2022-05-06 DIAGNOSIS — I10 Essential (primary) hypertension: Secondary | ICD-10-CM

## 2022-05-06 DIAGNOSIS — N189 Chronic kidney disease, unspecified: Secondary | ICD-10-CM

## 2022-05-06 DIAGNOSIS — W19XXXA Unspecified fall, initial encounter: Secondary | ICD-10-CM

## 2022-05-06 DIAGNOSIS — E785 Hyperlipidemia, unspecified: Secondary | ICD-10-CM

## 2022-05-06 DIAGNOSIS — K219 Gastro-esophageal reflux disease without esophagitis: Secondary | ICD-10-CM

## 2022-05-17 ENCOUNTER — Encounter
Admit: 2022-05-17 | Discharge: 2022-05-17 | Payer: MEDICARE | Primary: Student in an Organized Health Care Education/Training Program

## 2022-05-17 NOTE — Telephone Encounter
Per patient request an outgoing was fax sent to [place] [facility] [phone] with Reclast infusion referral/orders.    Notification was received the fax submitted successfully.     Contacted the patient and left voicemail [phone] (per pt authorization) message updating the patient the infusion orders were sent to [place].       Encounter Closed

## 2022-05-21 ENCOUNTER — Encounter
Admit: 2022-05-21 | Discharge: 2022-05-21 | Payer: MEDICARE | Primary: Student in an Organized Health Care Education/Training Program

## 2022-05-23 ENCOUNTER — Encounter
Admit: 2022-05-23 | Discharge: 2022-05-23 | Payer: MEDICARE | Primary: Student in an Organized Health Care Education/Training Program

## 2022-05-23 ENCOUNTER — Ambulatory Visit
Admit: 2022-05-23 | Discharge: 2022-05-23 | Payer: MEDICARE | Primary: Student in an Organized Health Care Education/Training Program

## 2022-05-23 DIAGNOSIS — W19XXXA Unspecified fall, initial encounter: Secondary | ICD-10-CM

## 2022-05-23 DIAGNOSIS — I10 Essential (primary) hypertension: Secondary | ICD-10-CM

## 2022-05-23 DIAGNOSIS — IMO0002 Squamous cell carcinoma: Secondary | ICD-10-CM

## 2022-05-23 DIAGNOSIS — E039 Hypothyroidism, unspecified: Secondary | ICD-10-CM

## 2022-05-23 DIAGNOSIS — M1990 Unspecified osteoarthritis, unspecified site: Secondary | ICD-10-CM

## 2022-05-23 DIAGNOSIS — R519 Generalized headaches: Secondary | ICD-10-CM

## 2022-05-23 DIAGNOSIS — C4490 Unspecified malignant neoplasm of skin, unspecified: Secondary | ICD-10-CM

## 2022-05-23 DIAGNOSIS — Z8719 Personal history of other diseases of the digestive system: Secondary | ICD-10-CM

## 2022-05-23 DIAGNOSIS — M549 Dorsalgia, unspecified: Secondary | ICD-10-CM

## 2022-05-23 DIAGNOSIS — N529 Male erectile dysfunction, unspecified: Secondary | ICD-10-CM

## 2022-05-23 DIAGNOSIS — Z01810 Encounter for preprocedural cardiovascular examination: Secondary | ICD-10-CM

## 2022-05-23 DIAGNOSIS — R2689 Other abnormalities of gait and mobility: Secondary | ICD-10-CM

## 2022-05-23 DIAGNOSIS — N189 Chronic kidney disease, unspecified: Secondary | ICD-10-CM

## 2022-05-23 DIAGNOSIS — E785 Hyperlipidemia, unspecified: Secondary | ICD-10-CM

## 2022-05-23 DIAGNOSIS — N390 Urinary tract infection, site not specified: Secondary | ICD-10-CM

## 2022-05-23 DIAGNOSIS — Z9889 Other specified postprocedural states: Secondary | ICD-10-CM

## 2022-05-23 DIAGNOSIS — K5732 Diverticulitis of large intestine without perforation or abscess without bleeding: Secondary | ICD-10-CM

## 2022-05-23 DIAGNOSIS — K319 Disease of stomach and duodenum, unspecified: Secondary | ICD-10-CM

## 2022-05-23 DIAGNOSIS — E041 Nontoxic single thyroid nodule: Secondary | ICD-10-CM

## 2022-05-23 DIAGNOSIS — N62 Hypertrophy of breast: Secondary | ICD-10-CM

## 2022-05-23 DIAGNOSIS — Z01818 Encounter for other preprocedural examination: Secondary | ICD-10-CM

## 2022-05-23 DIAGNOSIS — F419 Anxiety disorder, unspecified: Secondary | ICD-10-CM

## 2022-05-23 DIAGNOSIS — C73 Malignant neoplasm of thyroid gland: Secondary | ICD-10-CM

## 2022-05-23 DIAGNOSIS — A498 Other bacterial infections of unspecified site: Secondary | ICD-10-CM

## 2022-05-23 DIAGNOSIS — B999 Unspecified infectious disease: Secondary | ICD-10-CM

## 2022-05-23 DIAGNOSIS — D539 Nutritional anemia, unspecified: Secondary | ICD-10-CM

## 2022-05-23 DIAGNOSIS — K219 Gastro-esophageal reflux disease without esophagitis: Secondary | ICD-10-CM

## 2022-05-23 DIAGNOSIS — G629 Polyneuropathy, unspecified: Secondary | ICD-10-CM

## 2022-05-23 DIAGNOSIS — N400 Enlarged prostate without lower urinary tract symptoms: Secondary | ICD-10-CM

## 2022-05-23 DIAGNOSIS — K3184 Gastroparesis: Secondary | ICD-10-CM

## 2022-05-23 DIAGNOSIS — E119 Type 2 diabetes mellitus without complications: Secondary | ICD-10-CM

## 2022-05-23 DIAGNOSIS — N200 Calculus of kidney: Secondary | ICD-10-CM

## 2022-05-23 DIAGNOSIS — M545 Chronic lower back pain: Secondary | ICD-10-CM

## 2022-05-23 DIAGNOSIS — K2270 Barrett's esophagus without dysplasia: Secondary | ICD-10-CM

## 2022-05-23 DIAGNOSIS — E3120 Multiple endocrine neoplasia [MEN] syndrome, unspecified: Secondary | ICD-10-CM

## 2022-05-23 LAB — CBC: WBC COUNT: 7.2 K/UL (ref 4.5–11.0)

## 2022-05-23 LAB — BASIC METABOLIC PANEL
ANION GAP: 8 pg (ref 3–12)
BLD UREA NITROGEN: 20 mg/dL (ref 7–25)
CALCIUM: 9.7 mg/dL (ref 8.5–10.6)
CHLORIDE: 104 MMOL/L — ABNORMAL LOW (ref 98–110)
CO2: 28 MMOL/L (ref 21–30)
CREATININE: 1.3 mg/dL — ABNORMAL HIGH (ref 0.4–1.24)
EGFR: 57 mL/min — ABNORMAL LOW (ref >60)
GLUCOSE,PANEL: 73 mg/dL (ref 70–100)
POTASSIUM: 3.8 MMOL/L — ABNORMAL LOW (ref 3.5–5.1)
SODIUM: 140 MMOL/L — ABNORMAL LOW (ref 137–147)

## 2022-05-23 NOTE — Pre-Anesthesia Patient Instructions
PREPROCEDURE INFORMATION    Arrival at the hospital  [facility]  [address]  [place], [place] [address]    Park in the [employer], located directly across from the main entrance to the hospital.  Enter through the ground floor main hospital entrance and check in at the Information Desk in the lobby.  They will validate your parking ticket and direct you to the next location.  If you are a woman between the ages of 13 and 14, and have not had a hysterectomy, you will be asked for a urine sample prior to surgery.  Please do not urinate before arriving in the Surgery Waiting Room.  Once there, check in and let the attendant know if you need to provide a sample.    You will receive a call with your surgery arrival time between 2:30pm and 4:30pm the last business day before your procedure.  If you do not receive a call, please call [phone] before 4:30pm or [phone] after 4:30pm.    Eating or drinking before surgery  Nothing to eat after 11:00pm the night before your surgery including gum, mints and candy.  You may have clear liquids up to 2 hours before your surgery time.  Clear Liquid Examples include:  Water, Clear juice (apple or cranberry (no pulp or orange juice) - If diabetic blood sugar must be <200, Coffee and tea with or without sugar (no cream), Sports drink - Powerade/Gatorade, Soda, and Bowel Prep solutions only if ordered by surgeon.  If you have received specific instructions from your surgeon, please follow those.    Planning transportation for outpatient procedure  For your safety, you will need to arrange for a responsible ride/person to accompany you home due to sedation or anesthesia with your procedure.  An [facility] Goad, taxi or other public transportation driver is not considered a responsible person to accompany you home.    Bath/Shower Instructions  Take a bath or shower using the special soap given to you in PAC. Use half the bottle the night before, and the other half the morning of your procedure. Use clean towels with each bath or shower.  Put on clean clothes after bath or shower.  Avoid using lotion and oils.  If you are having surgery above the waist, wear a shirt that fastens up the front.  Sleep on clean sheets if bath or shower is done the night before procedure.    Morning of your procedure:  Brush your teeth and tongue  Do not smoke, vape, chew or user any tobacco products.  Do not shave the area where you will have surgery.  Remove nail polish, makeup and all jewelry (including piercings) before coming to the hospital.  Dress in clean, loose, comfortable clothing.    Valuables  Leave money, credit cards, jewelry, and any other valuables at home. If medications are being filled and picked up at a [facility] pharmacy, payment will be needed. [facility] is not responsible for the loss or breakage of personal items.    What to bring to the hospital  ID/Insurance card  Medical Device card  Official documents for legal guardianship  Copy of your Living Will, Advanced Directives, and/or Durable Power of Attorney. If you have these documents, please bring them to the admissions office on the day of your surgery to be scanned into your records.  Do not bring medications from home unless instructed by a pharmacist.  CPAP/BiPAP machine (including all supplies)  Walker, cane,  or motorized scooter  Cases for glasses/hearing aids/contact lens (bring solutions for contacts)  Stimulator remote  Insulin pump and supplies as directed by pharmacy     Notify us at [facility]: [phone] on the day of your procedure if:  You need to cancel your procedure.  You are going to be late.    Notify your surgeon if:  There is a possibility that you are pregnant.  You become ill with a cough, fever, sore throat, nausea, vomiting or flu-like symptoms.  You have any open wounds/sores that are red, painful, draining, or are new since you last saw the doctor.  You need to cancel your procedure.    Preparing to get your medications at discharge  Your surgeon may prescribe you medications to take after your procedure.  If you like the convenience of having your medications filled here at [facility], please do the following:  Go to [facility] pharmacy after your PAC appointment to put a credit card on file.  Call [facility] pharmacy at [phone] (Monday-Friday 7am-9pm or Saturday and Sunday 9am-5pm) to put a credit card on file.  Bring a credit card or cash on the day of your procedure- please leave with a family member rather than bringing it into the preop area.    Current Visitor Policy:  Visitors must be free of fever and symptoms to be in our facilities.  No more than 2 visitors per patient are allowed.  Additional guidelines may vary, based on patient care area or patient's condition.  Patients in semiprivate rooms may have visitors, but visits should be coordinated so only two total visitors are in a room at a time due to space limitations.  Children younger than age 12 are allowed to visit inpatients.    Thank you for participating in your Preoperative Assessment visit today.    If you have any changes to your health or hospitalizations between now and your surgery, please call us at [phone].    Instructions given to patient via: MyChart, verbal, and printed copy

## 2022-05-23 NOTE — Telephone Encounter
Voice message received from [place], w/[facility], [phone]  The order for Reclast had been received, but Pharmacist verbalized that patient's Creatinine was to elevated to administer the Reclast  Asking if there is another medication Dr. Gustavus Messing would recommend for patient's Osteoporosis?    Routing to Dr. Gustavus Messing for review/recommendations

## 2022-05-25 ENCOUNTER — Encounter
Admit: 2022-05-25 | Discharge: 2022-05-25 | Payer: MEDICARE | Primary: Student in an Organized Health Care Education/Training Program

## 2022-05-30 ENCOUNTER — Encounter
Admit: 2022-05-30 | Discharge: 2022-05-30 | Payer: MEDICARE | Primary: Student in an Organized Health Care Education/Training Program

## 2022-05-30 ENCOUNTER — Ambulatory Visit
Admit: 2022-05-30 | Discharge: 2022-05-30 | Payer: MEDICARE | Primary: Student in an Organized Health Care Education/Training Program

## 2022-05-30 DIAGNOSIS — Z9889 Other specified postprocedural states: Secondary | ICD-10-CM

## 2022-05-30 DIAGNOSIS — C73 Malignant neoplasm of thyroid gland: Secondary | ICD-10-CM

## 2022-05-30 DIAGNOSIS — A498 Other bacterial infections of unspecified site: Secondary | ICD-10-CM

## 2022-05-30 DIAGNOSIS — E119 Type 2 diabetes mellitus without complications: Secondary | ICD-10-CM

## 2022-05-30 DIAGNOSIS — Z8719 Personal history of other diseases of the digestive system: Secondary | ICD-10-CM

## 2022-05-30 DIAGNOSIS — M545 Chronic lower back pain: Secondary | ICD-10-CM

## 2022-05-30 DIAGNOSIS — K2270 Barrett's esophagus without dysplasia: Secondary | ICD-10-CM

## 2022-05-30 DIAGNOSIS — K319 Disease of stomach and duodenum, unspecified: Secondary | ICD-10-CM

## 2022-05-30 DIAGNOSIS — R519 Generalized headaches: Secondary | ICD-10-CM

## 2022-05-30 DIAGNOSIS — W19XXXA Unspecified fall, initial encounter: Secondary | ICD-10-CM

## 2022-05-30 DIAGNOSIS — N189 Chronic kidney disease, unspecified: Secondary | ICD-10-CM

## 2022-05-30 DIAGNOSIS — M549 Dorsalgia, unspecified: Secondary | ICD-10-CM

## 2022-05-30 DIAGNOSIS — E041 Nontoxic single thyroid nodule: Secondary | ICD-10-CM

## 2022-05-30 DIAGNOSIS — D539 Nutritional anemia, unspecified: Secondary | ICD-10-CM

## 2022-05-30 DIAGNOSIS — C4490 Unspecified malignant neoplasm of skin, unspecified: Secondary | ICD-10-CM

## 2022-05-30 DIAGNOSIS — E039 Hypothyroidism, unspecified: Secondary | ICD-10-CM

## 2022-05-30 DIAGNOSIS — R339 Retention of urine, unspecified: Secondary | ICD-10-CM

## 2022-05-30 DIAGNOSIS — N400 Enlarged prostate without lower urinary tract symptoms: Secondary | ICD-10-CM

## 2022-05-30 DIAGNOSIS — K219 Gastro-esophageal reflux disease without esophagitis: Secondary | ICD-10-CM

## 2022-05-30 DIAGNOSIS — K5732 Diverticulitis of large intestine without perforation or abscess without bleeding: Secondary | ICD-10-CM

## 2022-05-30 DIAGNOSIS — G629 Polyneuropathy, unspecified: Secondary | ICD-10-CM

## 2022-05-30 DIAGNOSIS — B999 Unspecified infectious disease: Secondary | ICD-10-CM

## 2022-05-30 DIAGNOSIS — N62 Hypertrophy of breast: Secondary | ICD-10-CM

## 2022-05-30 DIAGNOSIS — N390 Urinary tract infection, site not specified: Secondary | ICD-10-CM

## 2022-05-30 DIAGNOSIS — F419 Anxiety disorder, unspecified: Secondary | ICD-10-CM

## 2022-05-30 DIAGNOSIS — N529 Male erectile dysfunction, unspecified: Secondary | ICD-10-CM

## 2022-05-30 DIAGNOSIS — E785 Hyperlipidemia, unspecified: Secondary | ICD-10-CM

## 2022-05-30 DIAGNOSIS — R2689 Other abnormalities of gait and mobility: Secondary | ICD-10-CM

## 2022-05-30 DIAGNOSIS — IMO0002 Squamous cell carcinoma: Secondary | ICD-10-CM

## 2022-05-30 DIAGNOSIS — M1990 Unspecified osteoarthritis, unspecified site: Secondary | ICD-10-CM

## 2022-05-30 DIAGNOSIS — I10 Essential (primary) hypertension: Secondary | ICD-10-CM

## 2022-05-30 DIAGNOSIS — K3184 Gastroparesis: Secondary | ICD-10-CM

## 2022-05-30 DIAGNOSIS — N200 Calculus of kidney: Secondary | ICD-10-CM

## 2022-05-30 DIAGNOSIS — E3120 Multiple endocrine neoplasia [MEN] syndrome, unspecified: Secondary | ICD-10-CM

## 2022-05-30 NOTE — Progress Notes
Old 18 Fr coude catheter removed without difficulty. Genitals prepped in the usual sterile fashion.  New 18 Fr coude cath inserted without difficulty.  Balloon inflated w/ 10 cc sterile water. Catheter secured with stat lock and attached to overnight bag.  He tolerated procedure well.  Provided pt w/ extra stat locks per his request. Pt ambulatory w/ walker.

## 2022-05-30 NOTE — Progress Notes
Date of Service: 05/30/2022     Subjective:             Jimmy Waters is a 67 y.o. male who presents to clinic for follow-up.    History of Present Illness  Jimmy Waters is a 67 y.o. 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 No complaints today. He is doing well. Tolerating foley well. Occasional bladder spasms, well controlled on his current regimen.    Foley catheter exchanged by Dorathy Daft, RN without difficulty. See procedure note from this visit. He is planning on SC/IC on 06/11/2022 with Dr. Oren Bracket for his BPH with retention, recurrent urosepsis, and inability to place SPT due to his small bladder capacity.    > Medications, allergies, and history include:     Current Outpatient Medications:     acetaminophen (TYLENOL EXTRA STRENGTH) 500 mg tablet, Take two tablets by mouth daily as needed. Max of 4,000 mg of acetaminophen in 24 hours., Disp: , Rfl:     ascorbic acid (vitamin C) (VITAMIN C) 1,000 mg tablet, Take one tablet by mouth at bedtime daily., Disp: , Rfl:     atorvastatin (LIPITOR) 20 mg tablet, Take one tablet by mouth daily., Disp: , Rfl:     betamethasone dipropionate (BETANATE) 0.05 % topical cream, Apply  topically to affected area twice weekly., Disp: , Rfl:     blood-glucose meter,continuous (DEXCOM G6 RECEIVER MISC), Use as directed, Disp: , Rfl:     blood-glucose sensor (DEXCOM G6 SENSOR MISC), Use as directed, Disp: , Rfl:     blood-glucose transmitter (DEXCOM G6 TRANSMITTER MISC), Use as directed, Disp: , Rfl:     buPROPion HCL SR (WELLBUTRIN SR) 150 mg tablet, 12 hr sustained-release, Take one tablet by mouth twice daily., Disp: , Rfl:     CHOLEcalciferoL (vitamin D3) (DIALYVITE VITAMIN D) 125 mcg (5,000 unit) capsule, Take one capsule by mouth at bedtime daily., Disp: , Rfl:     Cranberry 500 mg cap, Take one capsule by mouth daily., Disp: 90 capsule, Rfl: 3    d-mannose 500 mg cap, Take 500 mg by mouth daily. (Patient taking differently: Take 1,500 mg by mouth daily.), Disp: 90 capsule, Rfl: 3    diclofenac sodium (VOLTAREN) 1 % topical gel, Apply  topically to affected area daily as needed., Disp: , Rfl:     docusate (COLACE) 100 mg capsule, Take one capsule by mouth twice daily as needed for Constipation., Disp: , Rfl:     dutasteride (AVODART) 0.5 mg capsule, Take one capsule by mouth at bedtime daily., Disp: , Rfl:     ferrous sulfate, dried (IRON) 159 mg (45 mg iron) TbER, Take one tablet by mouth at bedtime daily., Disp: , Rfl:     gabapentin (NEURONTIN) 600 mg tablet, Take two tablets by mouth twice daily., Disp: , Rfl:     glucagon (GVOKE HYPOPEN 2-PACK) 1 mg/0.2 mL syringe, Inject 1 mg into the upper arm, thigh, or buttocks if needed for severe hypoglycemia and then call 911., Disp: 0.4 mL, Rfl: 1    HUMALOG KWIKPEN INSULIN 100 unit/mL subcutaneous PEN, Inject eight Units under the skin three times daily with meals. Take with Sliding Scale: 2 units for every 50 over 150, Disp: , Rfl:     hyoscyamine sulfate (LEVSIN) 0.125 mg  tablet, Take one tablet by mouth every 4 hours as needed for Cramps (Urinary spasms)., Disp: 180 tablet, Rfl: 0    insulin glargine-lixisenatide (SOLIQUA 100/33) 100 unit-33 mcg/mL syringe, Inject twenty five Units under the skin daily. (Patient taking differently: Inject twenty five Units under the skin at bedtime daily.), Disp: 25 mL, Rfl: 1    ketoconazole (NIZORAL) 2 % topical cream, Apply  topically to affected area daily as needed., Disp: , Rfl:     ketoconazole (NIZORAL) 2 % topical shampoo, Apply  topically to affected area once. Apply topically to affected area of damp skin, lather, leave on 5 minutes, and rinse. Repeat two-three times weekly, Disp: , Rfl:     levothyroxine (SYNTHROID) 300 mcg tablet, Take one tablet by mouth daily 30 minutes before breakfast. Indications: a condition with low thyroid hormone levels, Disp: 90 tablet, Rfl: 1    levothyroxine (SYNTHROID) 50 mcg tablet, Take one tablet by mouth daily 30 minutes before breakfast., Disp: 90 tablet, Rfl: 3    losartan (COZAAR) 25 mg tablet, Take one tablet by mouth daily., Disp: , Rfl:     melatonin 10 mg tablet, Take one tablet to two tablets by mouth at bedtime daily., Disp: , Rfl:     metoprolol tartrate 25 mg tablet, Take one tablet by mouth twice daily., Disp: , Rfl:     MULTIVITAMIN PO, Take 1 tablet by mouth at bedtime daily., Disp: , Rfl:     ondansetron (ZOFRAN ODT) 8 mg rapid dissolve tablet, Dissolve one tablet by mouth every 8 hours as needed., Disp: 30 tablet, Rfl:     pantoprazole DR (PROTONIX) 40 mg tablet, Take one tablet by mouth twice daily., Disp: , Rfl:     polyethylene glycol 3350 (MIRALAX) 17 g packet, Take one packet by mouth daily as needed., Disp: 12 each, Rfl:     senna (SENOKOT) 8.6 mg tablet, Take one tablet by mouth twice daily as needed., Disp: 180 tablet, Rfl:     sertraline (ZOLOFT) 100 mg tablet, Take one tablet by mouth daily., Disp: , Rfl:     tamsulosin (FLOMAX) 0.4 mg capsule, Take one capsule by mouth at bedtime daily. Do not crush, chew or open capsules. Take 30 minutes following the same meal each day. (Patient taking differently: Take one capsule by mouth daily. Do not crush, chew or open capsules. Take 30 minutes following the same meal each day.), Disp: 90 capsule, Rfl:     triamcinolone acetonide (KENALOG) 0.025 % topical cream, Apply  topically to affected area daily as needed., Disp: , Rfl:     trospium (SANCTURA) 20 mg tablet, Take one tablet by mouth twice daily before meals. Take with water on an empty stomach, at least 1 hour before a meal. (Patient taking differently: Take one tablet by mouth twice daily as needed. Take with water on an empty stomach, at least 1 hour before a meal.), Disp: 180 tablet, Rfl: 3 zinc sulfate 220 mg (50 mg elemental zinc) capsule, Take one capsule by mouth at bedtime daily., Disp: , Rfl:      Allergies   Allergen Reactions    Ambien [Zolpidem] HALLUCINATIONS and AGITATION    Statins-Hmg-Coa Reductase Inhibitors MUSCLE PAIN     Tolerates low dose atorvastatin     Medical History:   Diagnosis Date    Anxiety disorder     Back pain     Barrett esophagus     BPH (benign prostatic hyperplasia)     Cancer of skin  Cataract     Chronic kidney disease unknown    Chronic lower back pain     Chronic UTI     Degenerative arthritis     Depression (disease)     Diverticulitis of colon (without mention of hemorrhage)(562.11)     type 2    DJD (degenerative joint disease)     DM type 2 (diabetes mellitus, type 2) (HCC)     Dyslipidemia     Erectile dysfunction 2021    Falls 2022    Gastroparesis     Generalized headaches 2022    GERD (gastroesophageal reflux disease)     Gynecomastia     History of dental problems     lower partial    History of Nissen fundoplication ~1990    HLD (hyperlipidemia)     HTN (hypertension)     Hx of arthroscopy of knee     Hypothyroidism     Infection     Kidney stones     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 2017 & 2022    Neuropathy     Osteoarthritis     Poor balance 2022    Shiga toxin-producing Escherichia coli infection     Skin cancer     Squamous cell carcinoma     Stomach disorder     Thyroid cancer (HCC) 2009    Thyroid nodule 2009, 2017, 2022    Unspecified deficiency anemia 2022     Surgical History:   Procedure Laterality Date    HX LUMBAR FUSION  1982    L4-5    HX THYROIDECTOMY  09/2007    CYSTOURETHROSCOPY  2012    KIDNEY STONE SURGERY  2012    COLONOSCOPY  02/14/2011    HX APPENDECTOMY  02/2011    REPEAT LEFT LUMBAR 5 TO SACRAL 1 LAMINOTOMY N/A 01/02/2019    Performed by Karie Chimera, MD at [facility] OR    INCISION AND DRAINAGE POSTOPERATIVE WOUND INFECTION - COMPLEX N/A 01/20/2019    Performed by Karie Chimera, MD at [facility] OR NEPHROSTOMY Right 04/2021    in place from right ureter from OSH    Revision right neck dissection Right 07/27/2021    Performed by Marlowe Aschoff, MD at [facility] OR    FLEXIBLE LARYNGOSCOPY DIAGNOSTIC N/A 07/27/2021    Performed by Marlowe Aschoff, MD at [facility] OR    OPEN TREATMENT FEMORAL SHAFT FRACTURE WITH PLATE/ SCREWS WITH/ WITHOUT CERCLAGE Right 08/13/2021    Performed by Rayford Halsted, MD at [facility] OR    CATARACT REMOVAL WITH IMPLANT Bilateral     CYSTOSCOPY      GASTRIC FUNDOPLICATION      HX HIP REPLACEMENT Bilateral     revision of 1 hip also    HX KNEE ARTHROSCOPY      HX KNEE REPLACMENT Bilateral     HX LAPAROTOMY      possible colon fistula    HX LYMPHADENECTOMY      HX ROTATOR CUFF REPAIR Bilateral     2006,2007    HX SHOULDER REPLACEMENT Bilateral     HX TONSILLECTOMY      HX VASECTOMY      PARATHYROID GLAND SURGERY  2009    PELVIS CLOSED REDUCTION      THYROIDECTOMY      UPPER GASTROINTESTINAL ENDOSCOPY       Family History   Problem Relation Age of Onset    Hypertension Mother  Cancer-Lung Mother 39    Melanoma Mother     Cancer Mother         lung    Diabetes Mother     Heart Disease Mother     Hyperlipidemia Mother     Thyroid Disease Mother     Diabetes Father     Arthritis-osteo Sister     Hypertension Sister     Heart Disease Sister     Cancer Sister         lymphoma    Diabetes Sister     Arthritis Sister     Cancer Sister     Cancer-Lung Brother 83    Hyperlipidemia Brother     Hypertension Brother     Diabetes Brother     Arthritis Brother     Cancer Brother         lung    Kidney Disease Brother     Cancer-Colon Maternal Aunt     Cancer Maternal Uncle     Unknown to Patient Paternal Aunt     Unknown to Patient Paternal Uncle     Unknown to Patient Maternal Grandmother     Unknown to Patient Maternal Grandfather     Unknown to Patient Paternal Grandmother     Unknown to Patient Paternal Grandfather     Cancer Sister         esophaguse    Diabetes Sister     Diabetes Brother     Heart Disease Sister Obesity Brother      Social History     Socioeconomic History    Marital status: Married   Tobacco Use    Smoking status: Never    Smokeless tobacco: Never   Vaping Use    Vaping status: Never Used   Substance and Sexual Activity    Alcohol use: Yes     Comment: ~ once a month    Drug use: Never    Sexual activity: Not Currently     Partners: Female     Birth control/protection: None   Social History Narrative    ** Merged History Encounter **          Objective:         Vitals:    05/30/22 1241   BP: (!) 149/84   BP Source: Arm, Left Upper   Pulse: 86   Temp: 36.9 ?C (98.4 ?F)   SpO2: 98%   TempSrc: Temporal   PainSc: Zero     There is no height or weight on file to calculate BMI.     Physical Exam  Vitals reviewed.   Constitutional:       General: He is not in acute distress.  HENT:      Head: Normocephalic and atraumatic.   Eyes:      Extraocular Movements: Extraocular movements intact.   Cardiovascular:      Rate and Rhythm: Normal rate.   Pulmonary:      Effort: Pulmonary effort is normal. No respiratory distress.   Abdominal:      General: There is no distension.      Palpations: Abdomen is soft.      Tenderness: There is no abdominal tenderness.   Genitourinary:     Comments: 18 Fr coude tip foley catheter in place draining clear yellow urine  Skin:     General: Skin is warm and dry.   Neurological:      Mental Status: He is alert and oriented to person, place,  and time. Mental status is at baseline.   Psychiatric:         Mood and Affect: Mood normal.         Behavior: Behavior normal.            Assessment and Plan:  Jimmy Waters is a 67 y.o. 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 Foley exchange without difficulty.    Plan:  - Plan to proceed with SC/IC with Dr. Oren Bracket on 06/11/2022  - Follow up PRN for monthly catheter exchanges should patient not proceed with SC/IC    Patient seen and discussed with Dr. Einar Crow, who directed plan of care.    Harriet Masson, MD  Urology Resident

## 2022-06-04 ENCOUNTER — Encounter
Admit: 2022-06-04 | Discharge: 2022-06-04 | Payer: MEDICARE | Primary: Student in an Organized Health Care Education/Training Program

## 2022-06-07 ENCOUNTER — Encounter
Admit: 2022-06-07 | Discharge: 2022-06-07 | Payer: MEDICARE | Primary: Student in an Organized Health Care Education/Training Program

## 2022-06-07 ENCOUNTER — Ambulatory Visit
Admit: 2022-06-07 | Discharge: 2022-06-07 | Payer: MEDICARE | Primary: Student in an Organized Health Care Education/Training Program

## 2022-06-07 DIAGNOSIS — W19XXXA Unspecified fall, initial encounter: Secondary | ICD-10-CM

## 2022-06-07 DIAGNOSIS — M549 Dorsalgia, unspecified: Secondary | ICD-10-CM

## 2022-06-07 DIAGNOSIS — N529 Male erectile dysfunction, unspecified: Secondary | ICD-10-CM

## 2022-06-07 DIAGNOSIS — K2270 Barrett's esophagus without dysplasia: Secondary | ICD-10-CM

## 2022-06-07 DIAGNOSIS — M545 Chronic lower back pain: Secondary | ICD-10-CM

## 2022-06-07 DIAGNOSIS — F419 Anxiety disorder, unspecified: Secondary | ICD-10-CM

## 2022-06-07 DIAGNOSIS — I10 Essential (primary) hypertension: Secondary | ICD-10-CM

## 2022-06-07 DIAGNOSIS — N200 Calculus of kidney: Secondary | ICD-10-CM

## 2022-06-07 DIAGNOSIS — Z9889 Other specified postprocedural states: Secondary | ICD-10-CM

## 2022-06-07 DIAGNOSIS — R2689 Other abnormalities of gait and mobility: Secondary | ICD-10-CM

## 2022-06-07 DIAGNOSIS — E039 Hypothyroidism, unspecified: Secondary | ICD-10-CM

## 2022-06-07 DIAGNOSIS — B999 Unspecified infectious disease: Secondary | ICD-10-CM

## 2022-06-07 DIAGNOSIS — Z8719 Personal history of other diseases of the digestive system: Secondary | ICD-10-CM

## 2022-06-07 DIAGNOSIS — N400 Enlarged prostate without lower urinary tract symptoms: Secondary | ICD-10-CM

## 2022-06-07 DIAGNOSIS — C73 Malignant neoplasm of thyroid gland: Secondary | ICD-10-CM

## 2022-06-07 DIAGNOSIS — E3120 Multiple endocrine neoplasia [MEN] syndrome, unspecified: Secondary | ICD-10-CM

## 2022-06-07 DIAGNOSIS — M1990 Unspecified osteoarthritis, unspecified site: Secondary | ICD-10-CM

## 2022-06-07 DIAGNOSIS — E785 Hyperlipidemia, unspecified: Secondary | ICD-10-CM

## 2022-06-07 DIAGNOSIS — A498 Other bacterial infections of unspecified site: Secondary | ICD-10-CM

## 2022-06-07 DIAGNOSIS — N390 Urinary tract infection, site not specified: Secondary | ICD-10-CM

## 2022-06-07 DIAGNOSIS — Z01818 Encounter for other preprocedural examination: Secondary | ICD-10-CM

## 2022-06-07 DIAGNOSIS — R519 Generalized headaches: Secondary | ICD-10-CM

## 2022-06-07 DIAGNOSIS — K5732 Diverticulitis of large intestine without perforation or abscess without bleeding: Secondary | ICD-10-CM

## 2022-06-07 DIAGNOSIS — K319 Disease of stomach and duodenum, unspecified: Secondary | ICD-10-CM

## 2022-06-07 DIAGNOSIS — D539 Nutritional anemia, unspecified: Secondary | ICD-10-CM

## 2022-06-07 DIAGNOSIS — K3184 Gastroparesis: Secondary | ICD-10-CM

## 2022-06-07 DIAGNOSIS — E041 Nontoxic single thyroid nodule: Secondary | ICD-10-CM

## 2022-06-07 DIAGNOSIS — N62 Hypertrophy of breast: Secondary | ICD-10-CM

## 2022-06-07 DIAGNOSIS — N189 Chronic kidney disease, unspecified: Secondary | ICD-10-CM

## 2022-06-07 DIAGNOSIS — G629 Polyneuropathy, unspecified: Secondary | ICD-10-CM

## 2022-06-07 DIAGNOSIS — IMO0002 Squamous cell carcinoma: Secondary | ICD-10-CM

## 2022-06-07 DIAGNOSIS — J9600 Acute respiratory failure, unspecified whether with hypoxia or hypercapnia: Secondary | ICD-10-CM

## 2022-06-07 DIAGNOSIS — K219 Gastro-esophageal reflux disease without esophagitis: Secondary | ICD-10-CM

## 2022-06-07 DIAGNOSIS — E119 Type 2 diabetes mellitus without complications: Secondary | ICD-10-CM

## 2022-06-07 DIAGNOSIS — C4490 Unspecified malignant neoplasm of skin, unspecified: Secondary | ICD-10-CM

## 2022-06-07 NOTE — Telephone Encounter
Reviewed time/location of testing, npo status, no caffeine for 24 hours, may take medications with water, testing will take about 4 hours to complete. Instructed patient to bring a water bottle and something to eat as well.  All other questions answered.   Also sending my chart message for reference.

## 2022-06-07 NOTE — Patient Instructions
Follow-Up:    -Thank you for allowing [place] to participate in your care today. Your After Visit Summary is being completed by Agapito Games, RN.    -We would like you to follow up in  6 months with Dedra Skeens, MD or Marlowe [place]) Marca Ancona, APRN  -The schedule is released approximately 4-5 months in advance. You will be called by our scheduling department to make an appointment and you will also receive a notification via MyChart to self-schedule.  However, if you would like to call to make this appointment, please call [phone].    -Please schedule the following testing at check out, or by calling our scheduling line: STRESS TEST    Changes From Today's Office Visit     - Stress test is scheduled for tomorrow at 9:00am here at the [facility] location.    [facility]    Nuclear Stress Test Instructions      Your cardiologist has asked that you have a nuclear stress test (also known as a Myocardial Perfusion Imaging (MPI) test.    This evaluation of your heart muscle consists of two sets of nuclear images and either a Treadmill stress test or a chemical stress test, decided by you and your physician.    You will get an IV placed in your arm for the test.    You will need to be able to raise your arm up by your head for about 20 minutes and lie on your back for about 10 minutes.  Please discuss this with your doctor or talk with the nuclear technologist or nurse if these are a problem for you.    It is recommended not to schedule any other appointment for the same day.      Wear comfortable clothing and walking shoes if you are walking on the treadmill.  Women should wear shorts or comfortable pants instead of dresses.   Sweatshirts or T-shirts work really well for imaging.    If you have a Zio Patch cardiac monitor in place, please reschedule your nuclear stress test after your monitoring period is complete. If you would like to proceed with the nuclear stress test during the monitoring time, the patch will have to be removed and the data will be lost; we can place a new patch following testing.       If you have a cardiac monitor with replacement patches, please inform the nurse prior to your appointment.  The monitor will need to be removed during testing. Please bring replacement patches with you so that we can resume monitoring following your test.     There may be enough time to leave to get a snack after the stress portion of the test.   The Technologist will tell you what time to return for the second set of images.    PLEASE NO CAFFEINE 24 HOURS PRIOR TO TEST:  Examples include coffee, tea, decaffeinated drinks, colas, [facility], Dr. Reino Kent.  Some orange sodas and root beers have caffeine, please check.  No energy drinks, Excedrin, Midol, or any foods CONTAINING CHOCOLATE.   Consuming Caffeine may postpone your test.    PLEASE DO NOT EAT OR DRINK THE MORNING OF YOUR TEST.  Water is ok to drink with your morning medications.    Please hold these medications the day of test:      DIABETIC PATIENTS:  if insulin dependent:  please take one third of your insulin with two pieces of dry toast  and a small juice.  Bring remaining 2/3   insulin and oral diabetic medications with you to your test.    PLEASE NO TOBACCO PRODUCTS BETWEEN SCANS  You will NOT need a driver for this test.  But are welcome to bring a visitor with you.   Visitors will not be able to accompany you back to stress room.   Please do not bring children to the nuclear stress test.    TEST FINDINGS:   You will receive the results of the test within 7 business days of completion of this test by telephone. If you have any questions concerning your nuclear stress test or if you do not hear from your cardiologist/or nurse with 7 business days, please call our office.                  Contacting our office:    -For NON-URGENT questions please contact us via message through your MyChart account.   -For all medication refills please contact your pharmacy or send a request through MyChart.     -For all questions that may need to be addressed urgently please call the [facility] nursing triage line at [phone] Monday - Friday 8am-5pm only. Please leave a detailed message with your name, date of birth, and reason for your call.  If your message is received before 3:30pm, every effort will be made to call you back the same day.  Please allow time for [place] to review your chart prior to call back.     -Should you have an urgent concern over the weekend/nights, the on-call triage line is [phone].    [facility] nursing team fax number: [phone]    -You may receive a survey in the upcoming weeks from [facility]. Your feedback is important to [place] and helps [place] continue to improve patient care and patient satisfaction.     -Please feel free to call our Financial Department at [phone] with any questions or concerns about estimated cost of testing or imaging ordered today. We are happy to provide CPT codes upon request.    Results & Testing Follow Up:    -Please allow 5-7 business days for the results of any testing to be reviewed. Please call our office if you have not heard from a nurse within this time frame.    -Should you choose to complete testing at an outside facility, please contact our office after completion of testing so that we can ensure that we have received results for your provider to review.    Lab and test results:  As a part of the CARES act, starting 06/25/2019, some results will be released to you via MyChart immediately and automatically.  You may see results before your provider sees them; however, your provider will review all these results and then they, or one of their team, will notify you of result information and recommendations.   Critical results will be addressed immediately, but otherwise, please allow [place] time to get back with you prior to you reaching out to [place] for questions.  This will usually take about 72 hours for labs and 5-7 days for procedure test results.

## 2022-06-07 NOTE — Progress Notes
Date of Service: 06/07/2022    Jimmy Waters is a 67 y.o. male.       HPI   Jimmy Waters is followed for diastolic heart failure and hypertension.  His greatest problem at the present time is urinary retention.  This information is taken from his nephrology note from 05/04/2022 for consistency:  He was instructed to start doing self-catheterization in 2020.  He eventually had Foley catheter placed early in 2023.  He had trauma to the right ureteral orifice with Foley balloon placement into the right ureter orifice s/p right percutaneous nephrostomy placement on 06/20/2021.  This was complicated by urosepsis and subcapsular hematoma.  He eventually had the nephrostomy tube removed around July 2023.  He also reports history of recurrent frequent UTIs.   He has required multiple hospitalizations for IV antibiotics due to concerns about resistant organisms.  He was once considered for suprapubic catheter placement but he was found to have small bladder with limited capacity and no adequate window to place the catheter.   He is scheduled for for cystectomy with ureteral ileal conduit and intestinal anastomosis on June 11, 2022.  Jimmy Waters also reports the following and fracturing his right femur on Aug 23, 2021.  It required open reduction and internal fixation and the patient reports that the surgery was uncomplicated.  Otherwise, from a cardiovascular perspective the patient has been doing well and reports no angina, congestive symptoms, palpitations, sensation of sustained forceful heart pounding, lightheadedness or syncope.  His exercise tolerance has been stable. The patient reports no myalgias, bleeding abnormalities, claudication or strokelike symptoms.  He has been checking his blood pressure and his blood pressure remains elevated with systolic blood pressures of 140 and diastolic blood pressures of 85 to 90 mmHg.  He has been a diabetic for the past 20 years.  He remains insulin requiring.  To his knowledge, the patient has no diabetic neuropathy, nephropathy or retinopathy.  The patient reports no history of prior myocardial infarction, renal insufficiency, transient ischemic attack or stroke.  Except for being told that his chest x-ray showed pulmonary vascular congestion on 11/25/2018, he reports no prior history for congestive heart failure.  The patient does have vertigo (which responds to meclizine) along with tinnitus and chronic hearing loss.    Historically Jimmy Waters apparently was seen in the emergency room for dizziness on 11/25/2018.  A chest x-ray was obtained which showed pulmonary vascular congestion and he was started on furosemide.  He fell in July 2020.  He reports that he bruised his ribs and sternum and had some musculoskeletal pain for several weeks following his fall. He also reports chronic left shoulder discomfort and has had prior left shoulder replacement in 2014.            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  Hypothyroid 08/22/2021    Nephrostomy status (HCC) 08/22/2021    History of femur fracture 08/22/2021    HTN (hypertension) 08/14/2021    HLD (hyperlipidemia) 08/14/2021    Uncontrolled type 2 diabetes mellitus with hyperglycemia, with long-term current use of insulin (HCC) 08/14/2021    Urethral injury 08/14/2021    Fall 08/14/2021    Pain, acute due to trauma 08/14/2021    Impaired mobility and activities of daily living 08/14/2021    Acute blood loss anemia 08/14/2021    Peri-prosthetic femoral shaft fracture 08/12/2021    Benign prostatic hyperplasia with urinary obstruction 07/24/2021    Bacteremia due to methicillin resistant Staphylococcus aureus 07/24/2021    Complicated UTI (urinary tract infection) 07/23/2021    Postoperative infection, initial encounter 01/20/2019    Lumbar stenosis with neurogenic claudication 01/02/2019    Lumbar radiculopathy 01/02/2019    Gastroesophageal reflux disease 05/06/2016     hx. of and none since fundoplication 25 yrs.ago      Chronic low back pain 05/06/2016    Dry mouth 12/01/2015    Postural dizziness with presyncope 12/01/2015    Medication side effects 10/06/2015    Pure hypercholesterolemia 10/06/2015    Agatston coronary artery calcium score greater than 400 09/14/2015    Obesity (BMI 30.0-34.9) 10/22/2014    Autonomic instability 09/20/2014    Chronic neck pain 05/02/2014    Low back pain with sciatica 05/02/2014    Numbness in both hands 05/02/2014    Insomnia 11/06/2013    Nephrolithiasis 05/13/2013    Balanitis 09/16/2012     Redness of distal glans and foreskin; no exudate or drainage      Lower urinary tract infectious disease 08/19/2012     UDS 12/01/21: Patient underwent urodynamic studies today.  Urodynamic studies significant for small capacity bladder with terminal DO at 50 cc.  The test was repeated which did again show terminal DO at around 50 cc.  He had significant detrusor overactivity during filling.  His detrusor pressure remained relatively stable during the filling phase.Voids primarily via Valsalva.  Not obstructed on bladder contractility index or bladder outlet antibiotics.  Postvoid residual was 27 cc.     RBUS 02/26/22: No hydro. Embolization coils in RIGHT kidney. 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  Persistent pneumaturia with E. Coli infections.  Treated with Macrobid and resolved but returned once antibiotic stopped.  IVP and cystoscopy performed by Dr. Larwance Waters in December 2012 both negative for evidence of fistula.  Ex lap on 03/12/11 with Dr. Larina Bras in [place], negative for definitive fistula.  Pneumaturia resolved following procedure.  Culture positive E. Coli UTI on 04/30/11, never treated.  Currently off antibiotics with intermittent RLQ and LLQ pain.         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 Most recently in September of 2017 he underwent neck dissection. 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 He underwent RAI in January of 2018. Post treatment scan showed some uptake on thyroid bed and mediastinal nodes. There were no enlarged nodes on CT scan.       E coli enteritis 12/23/2010     shiga toxin + (  at [facility])      DM (diabetes mellitus) (HCC) 12/23/2010    HTN (hypertension) 12/23/2010    Hyperlipidemia 12/23/2010     09/06/2015 Calcium Score 2377      Hypothyroidism 12/23/2010     S/p thyroidectomy for thyroid cancer, s/p I- 131 treatment           Review of Systems   Constitutional: Negative.   HENT: Negative.     Eyes: Negative.    Cardiovascular: Negative.    Respiratory: Negative.     Endocrine: Negative.    Hematologic/Lymphatic: Negative.    Skin: Negative.    Musculoskeletal: Negative.    Gastrointestinal: Negative.    Genitourinary: Negative.    Neurological: Negative.    Psychiatric/Behavioral: Negative.     Allergic/Immunologic: Negative.      Physical Exam  GENERAL: The patient is well developed, well nourished, resting comfortably and in no distress.  No frailty  HEENT: No abnormalities of the visible oro-nasopharynx, conjunctiva or sclera are noted.  NECK: There is no jugular venous distension. Carotids are palpable and without bruits. There is no thyroid enlargement.  Chest: Lung fields are clear to auscultation. There are no wheezes or crackles.  CV: There is a regular rhythm. The first and second heart sounds are normal. There are no murmurs, gallops or rubs.  His apical heart rate is 76 bpm.  ABD: The abdomen is soft and supple with normal bowel sounds. There is no hepatosplenomegaly, ascites, tenderness, masses or bruits.  Neuro: There are no focal motor defects. Ambulation is normal. Cognitive function appears normal.  Ext: There is trace  bipedal and lower extremity edema without evidence of deep vein thrombosis. Peripheral pulses are satisfactory.    SKIN: There are no rashes and no cellulitis  PSYCH: The patient is calm, rationale and oriented.    Cardiovascular Studies    A twelve-lead ECG obtained on 06/07/2022 reveals normal sinus rhythm with a heart rate of 71 bpm.  Left axis deviation is noted.  There are also mild nondiagnostic ST-T wave abnormalities seen.    Echo Doppler 04/19/2021:  Interpretation Summary    Technically difficult study with poor endocardial visualization.  LVEF ~65-70 %.  Normal LV size with normal to hyperdynamic systolic function.  No definite regional wall abnormalities identified on the echo contrast images.  Right ventricle not well visualized; unable to assess RV systolic function.  Valve structures poorly visualized. Limited Doppler studies demonstrate no significant regurgitation or stenosis.  No pericardial effusion.  Unable to accurately estimate PA systolic pressure.     Cardiovascular Health Factors  Vitals BP Readings from Last 3 Encounters:   06/07/22 (!) 144/84   05/30/22 (!) 149/84   05/23/22 (!) 154/79     Wt Readings from Last 3 Encounters:   06/07/22 106.8 kg (235 lb 6.4 oz)   05/23/22 105.2 kg (232 lb)   05/04/22 104.3 kg (230 lb)     BMI Readings from Last 3 Encounters:   06/07/22 31.06 kg/m?   05/23/22 30.61 kg/m?   05/04/22 30.34 kg/m?      Smoking Social History     Tobacco Use   Smoking Status Never Smokeless Tobacco Never      Lipid Profile Cholesterol   Date Value Ref Range Status   03/30/2021 123 <200 MG/DL Final     HDL   Date Value Ref Range Status   03/30/2021 43 >40 MG/DL Final     LDL   Date Value Ref Range  Status   03/30/2021 74 <100 mg/dL Final     Triglycerides   Date Value Ref Range Status   03/30/2021 151 (H) <150 MG/DL Final      Blood Sugar Hemoglobin A1C   Date Value Ref Range Status   08/13/2021 6.2 (H) 4.0 - 5.7 % Final     Comment:     The ADA recommends that most patients with type 1 and type 2 diabetes maintain   an A1c level <7%.       Glucose   Date Value Ref Range Status   05/23/2022 73 70 - 100 MG/DL Final   16/12/9602 540 70 - 105 mg/dL Final   98/01/9146 95 70 - 100 MG/DL Final     Glucose, POC   Date Value Ref Range Status   04/26/2022 67 (A) 70 - 100 Final     Comment:     I asked provider if another check is needed due to the low number provider stated no for another glucose check   04/26/2022 67 (A) 70 - 100 Final   12/24/2021 134 (H) 70 - 100 MG/DL Final          Problems Addressed Today  Encounter Diagnoses   Name Primary?    Acute respiratory failure, unspecified whether with hypoxia or hypercapnia (HCC) Yes    Primary hypertension     Pre-op evaluation    Diastolic heart failure    Assessment and Plan   Jimmy Waters reports that he has been stable from a cardiovascular perspective.  In order to further evaluate his risk for cardiovascular morbidity and mortality associated with non-cardiac surgery, I have asked him to obtain a regadenoson thallium stress test to assess for objective evidence of myocardial ischemia prior to surgery. His surgical risk can then be further addressed after this study has been obtained.  I asked him to return for follow-up in 1 years time. The total time spent during this interview and exam with preparation and chart review was 30 minutes.               Current Medications (including today's revisions)   acetaminophen (TYLENOL EXTRA STRENGTH) 500 mg tablet Take two tablets by mouth daily as needed. Max of 4,000 mg of acetaminophen in 24 hours.    ascorbic acid (vitamin C) (VITAMIN C) 1,000 mg tablet Take one tablet by mouth at bedtime daily.    atorvastatin (LIPITOR) 20 mg tablet Take one tablet by mouth daily.    betamethasone dipropionate (BETANATE) 0.05 % topical cream Apply  topically to affected area twice weekly.    blood-glucose meter,continuous (DEXCOM G6 RECEIVER MISC) Use as directed    blood-glucose sensor (DEXCOM G6 SENSOR MISC) Use as directed    blood-glucose transmitter (DEXCOM G6 TRANSMITTER MISC) Use as directed    buPROPion HCL SR (WELLBUTRIN SR) 150 mg tablet, 12 hr sustained-release Take one tablet by mouth twice daily.    CHOLEcalciferoL (vitamin D3) (DIALYVITE VITAMIN D) 125 mcg (5,000 unit) capsule Take one capsule by mouth at bedtime daily.    Cranberry 500 mg cap Take one capsule by mouth daily.    d-mannose 500 mg cap Take 500 mg by mouth daily. (Patient taking differently: Take 1,500 mg by mouth daily.)    diclofenac sodium (VOLTAREN) 1 % topical gel Apply  topically to affected area daily as needed.    dutasteride (AVODART) 0.5 mg capsule Take one capsule by mouth at bedtime daily.    ferrous sulfate, dried (IRON) 159  mg (45 mg iron) TbER Take one tablet by mouth at bedtime daily.    gabapentin (NEURONTIN) 600 mg tablet Take two tablets by mouth twice daily.    glucagon (GVOKE HYPOPEN 2-PACK) 1 mg/0.2 mL syringe Inject 1 mg into the upper arm, thigh, or buttocks if needed for severe hypoglycemia and then call 911.    HUMALOG KWIKPEN INSULIN 100 unit/mL subcutaneous PEN Inject eight Units under the skin three times daily with meals. Take with Sliding Scale: 2 units for every 50 over 150    hyoscyamine sulfate (LEVSIN) 0.125 mg tablet Take one tablet by mouth every 4 hours as needed for Cramps (Urinary spasms).    insulin glargine-lixisenatide (SOLIQUA 100/33) 100 unit-33 mcg/mL syringe Inject twenty five Units under the skin daily. (Patient taking differently: Inject twenty five Units under the skin at bedtime daily.)    ketoconazole (NIZORAL) 2 % topical cream Apply  topically to affected area daily as needed.    ketoconazole (NIZORAL) 2 % topical shampoo Apply  topically to affected area once. Apply topically to affected area of damp skin, lather, leave on 5 minutes, and rinse. Repeat two-three times weekly    levothyroxine (SYNTHROID) 300 mcg tablet Take one tablet by mouth daily 30 minutes before breakfast. Indications: a condition with low thyroid hormone levels    levothyroxine (SYNTHROID) 50 mcg tablet Take one tablet by mouth daily 30 minutes before breakfast. (Patient taking differently: Take  by mouth daily 30 minutes before breakfast.)    losartan (COZAAR) 25 mg tablet Take one tablet by mouth daily.    melatonin 10 mg tablet Take one tablet to two tablets by mouth at bedtime daily.    metoprolol tartrate 25 mg tablet Take one tablet by mouth twice daily.    MULTIVITAMIN PO Take 1 tablet by mouth at bedtime daily.    ondansetron (ZOFRAN ODT) 8 mg rapid dissolve tablet Dissolve one tablet by mouth every 8 hours as needed.    pantoprazole DR (PROTONIX) 40 mg tablet Take one tablet by mouth twice daily.    polyethylene glycol 3350 (MIRALAX) 17 g packet Take one packet by mouth daily as needed.    senna (SENOKOT) 8.6 mg tablet Take one tablet by mouth twice daily as needed.    sertraline (ZOLOFT) 100 mg tablet Take one tablet by mouth daily.    tamsulosin (FLOMAX) 0.4 mg capsule Take one capsule by mouth at bedtime daily. Do not crush, chew or open capsules. Take 30 minutes following the same meal each day. (Patient taking differently: Take one capsule by mouth daily. Do not crush, chew or open capsules. Take 30 minutes following the same meal each day.)    triamcinolone acetonide (KENALOG) 0.025 % topical cream Apply  topically to affected area daily as needed.    trospium (SANCTURA) 20 mg tablet Take one tablet by mouth twice daily before meals. Take with water on an empty stomach, at least 1 hour before a meal. (Patient taking differently: Take one tablet by mouth twice daily as needed. Take with water on an empty stomach, at least 1 hour before a meal.)    zinc sulfate 220 mg (50 mg elemental zinc) capsule Take one capsule by mouth at bedtime daily.

## 2022-06-08 ENCOUNTER — Encounter
Admit: 2022-06-08 | Discharge: 2022-06-08 | Payer: MEDICARE | Primary: Student in an Organized Health Care Education/Training Program

## 2022-06-08 ENCOUNTER — Ambulatory Visit
Admit: 2022-06-08 | Discharge: 2022-06-08 | Payer: MEDICARE | Primary: Student in an Organized Health Care Education/Training Program

## 2022-06-08 DIAGNOSIS — R768 Other specified abnormal immunological findings in serum: Secondary | ICD-10-CM

## 2022-06-08 DIAGNOSIS — I10 Essential (primary) hypertension: Secondary | ICD-10-CM

## 2022-06-08 DIAGNOSIS — Z0183 Encounter for blood typing: Secondary | ICD-10-CM

## 2022-06-08 DIAGNOSIS — Z01818 Encounter for other preprocedural examination: Secondary | ICD-10-CM

## 2022-06-08 MED ORDER — NITROGLYCERIN 0.4 MG SL SUBL
.4 mg | SUBLINGUAL | 0 refills | Status: AC | PRN
Start: 2022-06-08 — End: ?

## 2022-06-08 MED ORDER — EUCALYPTUS-MENTHOL MM LOZG
1 | Freq: Once | ORAL | 0 refills | Status: AC | PRN
Start: 2022-06-08 — End: ?

## 2022-06-08 MED ORDER — ALBUTEROL SULFATE 90 MCG/ACTUATION IN HFAA
2 | RESPIRATORY_TRACT | 0 refills | Status: AC | PRN
Start: 2022-06-08 — End: ?

## 2022-06-08 MED ORDER — AMINOPHYLLINE 25 MG/ML IV SOLN
50 mg | INTRAVENOUS | 0 refills | Status: AC | PRN
Start: 2022-06-08 — End: ?

## 2022-06-08 MED ORDER — REGADENOSON 0.4 MG/5 ML IV SYRG
.4 mg | Freq: Once | INTRAVENOUS | 0 refills | Status: CP
Start: 2022-06-08 — End: ?
  Administered 2022-06-08: 14:00:00 0.4 mg via INTRAVENOUS

## 2022-06-08 MED ORDER — RP DX TC-99M TETROFOSMIN MCI
8.8 | Freq: Once | INTRAVENOUS | 0 refills | Status: CP
Start: 2022-06-08 — End: ?
  Administered 2022-06-08: 14:00:00 8.8 via INTRAVENOUS

## 2022-06-08 MED ORDER — SODIUM CHLORIDE 0.9 % IV SOLP
250 mL | INTRAVENOUS | 0 refills | Status: AC | PRN
Start: 2022-06-08 — End: ?

## 2022-06-08 MED ORDER — RP DX TC-99M TETROFOSMIN MCI
26.4 | Freq: Once | INTRAVENOUS | 0 refills | Status: CP
Start: 2022-06-08 — End: ?
  Administered 2022-06-08: 16:00:00 26.4 via INTRAVENOUS

## 2022-06-08 NOTE — Telephone Encounter
-----   Message from Nehemiah Massed, MD sent at 06/08/2022  2:24 PM CDT -----  Jimmy Waters: Favorable stress test.  Please let him know.  An addendum has been placed on his clinic note from 06/07/2022 concluding his preoperative evaluation.  Please send to his surgical and anesthesia teams.  Thanks.  SBG

## 2022-06-08 NOTE — Telephone Encounter
LVM for patient with results and recommendations as stated below. Call back number provided for any questions or concerns.

## 2022-06-11 ENCOUNTER — Encounter
Admit: 2022-06-11 | Discharge: 2022-06-11 | Payer: MEDICARE | Primary: Student in an Organized Health Care Education/Training Program

## 2022-06-11 ENCOUNTER — Inpatient Hospital Stay
Admit: 2022-06-11 | Discharge: 2022-06-11 | Payer: MEDICARE | Primary: Student in an Organized Health Care Education/Training Program

## 2022-06-11 DIAGNOSIS — N200 Calculus of kidney: Secondary | ICD-10-CM

## 2022-06-11 DIAGNOSIS — B999 Unspecified infectious disease: Secondary | ICD-10-CM

## 2022-06-11 DIAGNOSIS — E3120 Multiple endocrine neoplasia [MEN] syndrome, unspecified: Secondary | ICD-10-CM

## 2022-06-11 DIAGNOSIS — G629 Polyneuropathy, unspecified: Secondary | ICD-10-CM

## 2022-06-11 DIAGNOSIS — N62 Hypertrophy of breast: Secondary | ICD-10-CM

## 2022-06-11 DIAGNOSIS — M549 Dorsalgia, unspecified: Secondary | ICD-10-CM

## 2022-06-11 DIAGNOSIS — C4490 Unspecified malignant neoplasm of skin, unspecified: Secondary | ICD-10-CM

## 2022-06-11 DIAGNOSIS — N529 Male erectile dysfunction, unspecified: Secondary | ICD-10-CM

## 2022-06-11 DIAGNOSIS — F419 Anxiety disorder, unspecified: Secondary | ICD-10-CM

## 2022-06-11 DIAGNOSIS — C73 Malignant neoplasm of thyroid gland: Secondary | ICD-10-CM

## 2022-06-11 DIAGNOSIS — I10 Essential (primary) hypertension: Secondary | ICD-10-CM

## 2022-06-11 DIAGNOSIS — M1990 Unspecified osteoarthritis, unspecified site: Secondary | ICD-10-CM

## 2022-06-11 DIAGNOSIS — N390 Urinary tract infection, site not specified: Secondary | ICD-10-CM

## 2022-06-11 DIAGNOSIS — E039 Hypothyroidism, unspecified: Secondary | ICD-10-CM

## 2022-06-11 DIAGNOSIS — IMO0002 Squamous cell carcinoma: Secondary | ICD-10-CM

## 2022-06-11 DIAGNOSIS — E119 Type 2 diabetes mellitus without complications: Secondary | ICD-10-CM

## 2022-06-11 DIAGNOSIS — K5732 Diverticulitis of large intestine without perforation or abscess without bleeding: Secondary | ICD-10-CM

## 2022-06-11 DIAGNOSIS — K3184 Gastroparesis: Secondary | ICD-10-CM

## 2022-06-11 DIAGNOSIS — K219 Gastro-esophageal reflux disease without esophagitis: Secondary | ICD-10-CM

## 2022-06-11 DIAGNOSIS — N189 Chronic kidney disease, unspecified: Secondary | ICD-10-CM

## 2022-06-11 DIAGNOSIS — N400 Enlarged prostate without lower urinary tract symptoms: Secondary | ICD-10-CM

## 2022-06-11 DIAGNOSIS — A498 Other bacterial infections of unspecified site: Secondary | ICD-10-CM

## 2022-06-11 DIAGNOSIS — M545 Chronic lower back pain: Secondary | ICD-10-CM

## 2022-06-11 DIAGNOSIS — R519 Generalized headaches: Secondary | ICD-10-CM

## 2022-06-11 DIAGNOSIS — R2689 Other abnormalities of gait and mobility: Secondary | ICD-10-CM

## 2022-06-11 DIAGNOSIS — Z8719 Personal history of other diseases of the digestive system: Secondary | ICD-10-CM

## 2022-06-11 DIAGNOSIS — E785 Hyperlipidemia, unspecified: Secondary | ICD-10-CM

## 2022-06-11 DIAGNOSIS — K2270 Barrett's esophagus without dysplasia: Secondary | ICD-10-CM

## 2022-06-11 DIAGNOSIS — Z9889 Other specified postprocedural states: Secondary | ICD-10-CM

## 2022-06-11 DIAGNOSIS — D539 Nutritional anemia, unspecified: Secondary | ICD-10-CM

## 2022-06-11 DIAGNOSIS — W19XXXA Unspecified fall, initial encounter: Secondary | ICD-10-CM

## 2022-06-11 DIAGNOSIS — K319 Disease of stomach and duodenum, unspecified: Secondary | ICD-10-CM

## 2022-06-11 DIAGNOSIS — E041 Nontoxic single thyroid nodule: Secondary | ICD-10-CM

## 2022-06-11 MED ORDER — ROPIVACAINE (PF) 2 MG/ML (0.2 %) IJ SOLN
0 refills | Status: CP
Start: 2022-06-11 — End: ?

## 2022-06-11 MED ORDER — ALBUMIN, HUMAN 5 % 250 ML IV SOLP (AN)(OSM)
INTRAVENOUS | 0 refills | Status: DC
Start: 2022-06-11 — End: 2022-06-11

## 2022-06-11 MED ORDER — VASOPRESSIN 20 UNITS/20ML SYR (1 UNIT/ML) (AN) (OSM)
INTRAVENOUS | 0 refills | Status: DC
Start: 2022-06-11 — End: 2022-06-11
  Administered 2022-06-11 (×2): 1 [IU] via INTRAVENOUS

## 2022-06-11 MED ORDER — DEXAMETHASONE SODIUM PHOSPHATE 4 MG/ML IJ SOLN
INTRAVENOUS | 0 refills | Status: DC
Start: 2022-06-11 — End: 2022-06-11

## 2022-06-11 MED ORDER — FENTANYL CITRATE (PF) 50 MCG/ML IJ SOLN
INTRAMUSCULAR | 0 refills | Status: DC
Start: 2022-06-11 — End: 2022-06-11

## 2022-06-11 MED ORDER — LACTATED RINGERS IV SOLP
INTRAVENOUS | 0 refills | Status: DC
Start: 2022-06-11 — End: 2022-06-11

## 2022-06-11 MED ORDER — FENTANYL CITRATE (PF) 50 MCG/ML IJ SOLN
INTRAVENOUS | 0 refills | Status: CP
Start: 2022-06-11 — End: ?

## 2022-06-11 MED ORDER — VASOPRESSIN 0.2 UNIT/ML IV SOLN (INFUSION)(AM)(OR)
INTRAVENOUS | 0 refills | Status: DC
Start: 2022-06-11 — End: 2022-06-11
  Administered 2022-06-11: 19:00:00 2.4 [IU]/h via INTRAVENOUS

## 2022-06-11 MED ORDER — LIDOCAINE (PF) 10 MG/ML (1 %) IJ SOLN
SUBCUTANEOUS | 0 refills | Status: CP
Start: 2022-06-11 — End: ?

## 2022-06-11 MED ORDER — HYDROMORPHONE (PF) 2 MG/ML IJ SYRG
INTRAVENOUS | 0 refills | Status: DC
Start: 2022-06-11 — End: 2022-06-11

## 2022-06-11 MED ORDER — PHENYLEPHRINE 40 MCG/ML IN NS IV DRIP (STD CONC)
INTRAVENOUS | 0 refills | Status: DC
Start: 2022-06-11 — End: 2022-06-11
  Administered 2022-06-11 (×2): .3 ug/kg/min via INTRAVENOUS

## 2022-06-11 MED ORDER — PIPERACILLIN/TAZOBACTAM 4.5 G/100ML NS IVPB (MB+)
INTRAVENOUS | 0 refills | Status: DC
Start: 2022-06-11 — End: 2022-06-11
  Administered 2022-06-11 (×2): 4.5 g via INTRAVENOUS

## 2022-06-11 MED ORDER — INSULIN 100 UNITS IN 100 ML NS INJECTION
INTRAVENOUS | 0 refills | Status: DC
Start: 2022-06-11 — End: 2022-06-11

## 2022-06-11 MED ORDER — SUGAMMADEX 100 MG/ML IV SOLN
INTRAVENOUS | 0 refills | Status: DC
Start: 2022-06-11 — End: 2022-06-11

## 2022-06-11 MED ORDER — INSULIN REGULAR IN 0.9 % NACL 100 UNIT/100 ML (INFUSION)(AM)(OR)
INTRAVENOUS | 0 refills | Status: DC
Start: 2022-06-11 — End: 2022-06-11
  Administered 2022-06-11: 18:00:00 2 [IU]/h via INTRAVENOUS

## 2022-06-11 MED ORDER — INDOCYANINE GREEN 25 MG IJ SOLR
INTRAVENOUS | 0 refills | Status: DC
Start: 2022-06-11 — End: 2022-06-11

## 2022-06-11 MED ORDER — DEXAMETHASONE SODIUM PHOSPHATE 10 MG/ML IJ SOLN
0 refills | Status: CP
Start: 2022-06-11 — End: ?

## 2022-06-11 MED ORDER — LIDOCAINE (PF) 200 MG/10 ML (2 %) IJ SYRG
INTRAVENOUS | 0 refills | Status: DC
Start: 2022-06-11 — End: 2022-06-11

## 2022-06-11 MED ORDER — PROPOFOL INJ 10 MG/ML IV VIAL
INTRAVENOUS | 0 refills | Status: DC
Start: 2022-06-11 — End: 2022-06-11

## 2022-06-11 MED ORDER — ARTIFICIAL TEARS (PF) SINGLE DOSE DROPS GROUP
OPHTHALMIC | 0 refills | Status: DC
Start: 2022-06-11 — End: 2022-06-11

## 2022-06-11 MED ORDER — ROCURONIUM 10 MG/ML IV SOLN
INTRAVENOUS | 0 refills | Status: DC
Start: 2022-06-11 — End: 2022-06-11

## 2022-06-11 MED ORDER — PHENYLEPHRINE HCL IN 0.9% NACL 1 MG/10 ML (100 MCG/ML) IV SYRG
INTRAVENOUS | 0 refills | Status: DC
Start: 2022-06-11 — End: 2022-06-11

## 2022-06-11 MED ORDER — ONDANSETRON HCL (PF) 4 MG/2 ML IJ SOLN
INTRAVENOUS | 0 refills | Status: DC
Start: 2022-06-11 — End: 2022-06-11

## 2022-06-11 MED ADMIN — HYDROMORPHONE (PF) 2 MG/ML IJ SYRG [163476]: 0.5 mg | INTRAVENOUS | @ 23:00:00 | Stop: 2022-06-12 | NDC 00409131203

## 2022-06-11 MED ADMIN — PIPERACILLIN-TAZOBACTAM 4.5 GRAM IV SOLR [80419]: 4.5 g | INTRAVENOUS | @ 15:00:00 | Stop: 2022-06-11 | NDC 60505615900

## 2022-06-11 MED ADMIN — SODIUM CHLORIDE 0.9 % IV PGBK (MB+) [95161]: 4.5 g | INTRAVENOUS | @ 15:00:00 | Stop: 2022-06-11 | NDC 00338915930

## 2022-06-11 MED ADMIN — ACETAMINOPHEN 500 MG PO TAB [102]: 1000 mg | ORAL | @ 14:00:00 | Stop: 2022-06-11 | NDC 00904673061

## 2022-06-11 MED ADMIN — SODIUM CHLORIDE 0.9 % IV SOLP [27838]: 1000 mL | INTRAVENOUS | @ 14:00:00 | Stop: 2022-06-12 | NDC 00338004904

## 2022-06-11 MED ADMIN — ALVIMOPAN 12 MG PO CAP [170036]: 12 mg | ORAL | @ 14:00:00 | Stop: 2022-06-11 | NDC 00591231245

## 2022-06-11 MED ADMIN — HEPARIN, PORCINE (PF) 5,000 UNIT/0.5 ML IJ SYRG [95535]: 5000 [IU] | SUBCUTANEOUS | @ 15:00:00 | Stop: 2022-06-11 | NDC 00409131611

## 2022-06-12 MED ADMIN — PIPERACILLIN-TAZOBACTAM 4.5 GRAM IV SOLR [80419]: 4.5 g | INTRAVENOUS | @ 02:00:00 | NDC 60505615900

## 2022-06-12 MED ADMIN — SODIUM CHLORIDE 0.9 % IV PGBK (MB+) [95161]: 4.5 g | INTRAVENOUS | @ 13:00:00 | NDC 00338915930

## 2022-06-12 MED ADMIN — PIPERACILLIN-TAZOBACTAM 4.5 GRAM IV SOLR [80419]: 4.5 g | INTRAVENOUS | @ 19:00:00 | NDC 60505615900

## 2022-06-12 MED ADMIN — METHOCARBAMOL 750 MG PO TAB [4972]: 750 mg | ORAL | @ 13:00:00 | Stop: 2022-06-12 | NDC 70010077001

## 2022-06-12 MED ADMIN — POLYETHYLENE GLYCOL 3350 17 GRAM PO PWPK [25424]: 17 g | ORAL | @ 16:00:00 | NDC 00904693186

## 2022-06-12 MED ADMIN — GABAPENTIN 300 MG PO CAP [18308]: 600 mg | ORAL | @ 02:00:00 | NDC 67877022305

## 2022-06-12 MED ADMIN — INSULIN ASPART 100 UNIT/ML SC FLEXPEN [87504]: 2 [IU] | SUBCUTANEOUS | @ 16:00:00 | NDC 00169633910

## 2022-06-12 MED ADMIN — ONDANSETRON HCL (PF) 4 MG/2 ML IJ SOLN [136012]: 4 mg | INTRAVENOUS | @ 16:00:00 | Stop: 2022-06-12 | NDC 72266012301

## 2022-06-12 MED ADMIN — BACITRACIN ZINC 500 UNIT/GRAM TP OINT [13818]: TOPICAL | @ 02:00:00 | NDC 51672207502

## 2022-06-12 MED ADMIN — HEPARIN, PORCINE (PF) 5,000 UNIT/0.5 ML IJ SYRG [95535]: 5000 [IU] | SUBCUTANEOUS | @ 11:00:00 | NDC 00409131611

## 2022-06-12 MED ADMIN — ACETAMINOPHEN 500 MG PO TAB [102]: 1000 mg | ORAL | @ 19:00:00 | NDC 00904673061

## 2022-06-12 MED ADMIN — PANTOPRAZOLE 40 MG PO TBEC [80436]: 40 mg | ORAL | @ 13:00:00 | NDC 65862056099

## 2022-06-12 MED ADMIN — LACTATED RINGERS IV SOLP [4318]: 1000.000 mL | INTRAVENOUS | @ 05:00:00 | Stop: 2022-06-13 | NDC 00338011704

## 2022-06-12 MED ADMIN — HEPARIN, PORCINE (PF) 5,000 UNIT/0.5 ML IJ SYRG [95535]: 5000 [IU] | SUBCUTANEOUS | @ 19:00:00 | NDC 00409131611

## 2022-06-12 MED ADMIN — GABAPENTIN 300 MG PO CAP [18308]: 600 mg | ORAL | @ 13:00:00 | NDC 67877022305

## 2022-06-12 MED ADMIN — LIDOCAINE 5 % TP PTMD [80759]: 1 | TOPICAL | @ 13:00:00 | NDC 00591352511

## 2022-06-12 MED ADMIN — PIPERACILLIN-TAZOBACTAM 4.5 GRAM IV SOLR [80419]: 4.5 g | INTRAVENOUS | @ 07:00:00 | NDC 60505615900

## 2022-06-12 MED ADMIN — ACETAMINOPHEN 500 MG PO TAB [102]: 1000 mg | ORAL | @ 07:00:00 | NDC 00904673061

## 2022-06-12 MED ADMIN — ALVIMOPAN 12 MG PO CAP [170036]: 12 mg | ORAL | @ 02:00:00 | NDC 00591231245

## 2022-06-12 MED ADMIN — SODIUM CHLORIDE 0.9 % IV PGBK (MB+) [95161]: 4.5 g | INTRAVENOUS | @ 19:00:00 | NDC 00338915930

## 2022-06-12 MED ADMIN — SERTRALINE 100 MG PO TAB [78401]: 100 mg | ORAL | @ 13:00:00 | NDC 65862001330

## 2022-06-12 MED ADMIN — FENTANYL CITRATE (PF) 50 MCG/ML IJ SOLN [3037]: 50 ug | INTRAVENOUS | @ 05:00:00 | NDC 00409909412

## 2022-06-12 MED ADMIN — FENTANYL CITRATE (PF) 50 MCG/ML IJ SOLN [3037]: 50 ug | INTRAVENOUS | @ 08:00:00 | NDC 00409909412

## 2022-06-12 MED ADMIN — SODIUM CHLORIDE 0.9 % IV SOLP [27838]: 250 mL | INTRAVENOUS | @ 02:00:00 | Stop: 2022-06-12 | NDC 00338004902

## 2022-06-12 MED ADMIN — OXYCODONE 5 MG PO TAB [10814]: 10 mg | ORAL | @ 07:00:00 | NDC 00904696661

## 2022-06-12 MED ADMIN — OXYCODONE 5 MG PO TAB [10814]: 10 mg | ORAL | @ 11:00:00 | NDC 00904696661

## 2022-06-12 MED ADMIN — ATORVASTATIN 20 MG PO TAB [77412]: 20 mg | ORAL | @ 13:00:00 | NDC 67877051290

## 2022-06-12 MED ADMIN — SENNOSIDES-DOCUSATE SODIUM 8.6-50 MG PO TAB [40926]: 1 | ORAL | @ 02:00:00 | NDC 00536124810

## 2022-06-12 MED ADMIN — FENTANYL CITRATE (PF) 50 MCG/ML IJ SOLN [3037]: 50 ug | INTRAVENOUS | @ 19:00:00 | NDC 00409909412

## 2022-06-12 MED ADMIN — METOPROLOL TARTRATE 25 MG PO TAB [37637]: 25 mg | ORAL | @ 02:00:00 | NDC 51079025501

## 2022-06-12 MED ADMIN — OXYCODONE 5 MG PO TAB [10814]: 10 mg | ORAL | @ 03:00:00 | NDC 00904696661

## 2022-06-12 MED ADMIN — ALVIMOPAN 12 MG PO CAP [170036]: 12 mg | ORAL | @ 13:00:00 | Stop: 2022-06-19 | NDC 00591231245

## 2022-06-12 MED ADMIN — ACETAMINOPHEN 500 MG PO TAB [102]: 1000 mg | ORAL | @ 13:00:00 | NDC 00904673061

## 2022-06-12 MED ADMIN — LEVOTHYROXINE 175 MCG PO TAB [10406]: 350 ug | ORAL | @ 11:00:00 | NDC 00904695761

## 2022-06-12 MED ADMIN — METOPROLOL TARTRATE 25 MG PO TAB [37637]: 25 mg | ORAL | @ 13:00:00 | NDC 51079025501

## 2022-06-12 MED ADMIN — ACETAMINOPHEN 500 MG PO TAB [102]: 1000 mg | ORAL | @ 02:00:00 | NDC 00904673061

## 2022-06-12 MED ADMIN — SODIUM CHLORIDE 0.9 % IV PGBK (MB+) [95161]: 4.5 g | INTRAVENOUS | @ 02:00:00 | NDC 00338915930

## 2022-06-12 MED ADMIN — HEPARIN, PORCINE (PF) 5,000 UNIT/0.5 ML IJ SYRG [95535]: 5000 [IU] | SUBCUTANEOUS | @ 02:00:00 | NDC 00409131611

## 2022-06-12 MED ADMIN — PIPERACILLIN-TAZOBACTAM 4.5 GRAM IV SOLR [80419]: 4.5 g | INTRAVENOUS | @ 13:00:00 | NDC 60505615900

## 2022-06-12 MED ADMIN — SENNOSIDES-DOCUSATE SODIUM 8.6-50 MG PO TAB [40926]: 1 | ORAL | @ 13:00:00 | NDC 00536124810

## 2022-06-12 MED ADMIN — LACTATED RINGERS IV SOLP [4318]: 1000.000 mL | INTRAVENOUS | @ 16:00:00 | Stop: 2022-06-13 | NDC 00338011704

## 2022-06-12 MED ADMIN — METHOCARBAMOL 750 MG PO TAB [4972]: 750 mg | ORAL | @ 19:00:00 | NDC 70010077001

## 2022-06-12 MED ADMIN — PANTOPRAZOLE 40 MG PO TBEC [80436]: 40 mg | ORAL | @ 02:00:00 | NDC 65862056099

## 2022-06-12 MED ADMIN — ONDANSETRON HCL (PF) 4 MG/2 ML IJ SOLN [136012]: 4 mg | INTRAVENOUS | @ 17:00:00 | NDC 72266012301

## 2022-06-12 MED ADMIN — SODIUM CHLORIDE 0.9 % IV PGBK (MB+) [95161]: 4.5 g | INTRAVENOUS | @ 07:00:00 | NDC 00338915930

## 2022-06-12 MED ADMIN — OXYCODONE 5 MG PO TAB [10814]: 10 mg | ORAL | @ 16:00:00 | NDC 00904696661

## 2022-06-13 ENCOUNTER — Encounter
Admit: 2022-06-13 | Discharge: 2022-06-13 | Payer: MEDICARE | Primary: Student in an Organized Health Care Education/Training Program

## 2022-06-13 DIAGNOSIS — N529 Male erectile dysfunction, unspecified: Secondary | ICD-10-CM

## 2022-06-13 DIAGNOSIS — R519 Generalized headaches: Secondary | ICD-10-CM

## 2022-06-13 DIAGNOSIS — I10 Essential (primary) hypertension: Secondary | ICD-10-CM

## 2022-06-13 DIAGNOSIS — K319 Disease of stomach and duodenum, unspecified: Secondary | ICD-10-CM

## 2022-06-13 DIAGNOSIS — D539 Nutritional anemia, unspecified: Secondary | ICD-10-CM

## 2022-06-13 DIAGNOSIS — Z9889 Other specified postprocedural states: Secondary | ICD-10-CM

## 2022-06-13 DIAGNOSIS — M1990 Unspecified osteoarthritis, unspecified site: Secondary | ICD-10-CM

## 2022-06-13 DIAGNOSIS — Z8719 Personal history of other diseases of the digestive system: Secondary | ICD-10-CM

## 2022-06-13 DIAGNOSIS — N62 Hypertrophy of breast: Secondary | ICD-10-CM

## 2022-06-13 DIAGNOSIS — K5732 Diverticulitis of large intestine without perforation or abscess without bleeding: Secondary | ICD-10-CM

## 2022-06-13 DIAGNOSIS — M549 Dorsalgia, unspecified: Secondary | ICD-10-CM

## 2022-06-13 DIAGNOSIS — F419 Anxiety disorder, unspecified: Secondary | ICD-10-CM

## 2022-06-13 DIAGNOSIS — K219 Gastro-esophageal reflux disease without esophagitis: Secondary | ICD-10-CM

## 2022-06-13 DIAGNOSIS — K2270 Barrett's esophagus without dysplasia: Secondary | ICD-10-CM

## 2022-06-13 DIAGNOSIS — N400 Enlarged prostate without lower urinary tract symptoms: Secondary | ICD-10-CM

## 2022-06-13 DIAGNOSIS — E785 Hyperlipidemia, unspecified: Secondary | ICD-10-CM

## 2022-06-13 DIAGNOSIS — N200 Calculus of kidney: Secondary | ICD-10-CM

## 2022-06-13 DIAGNOSIS — E119 Type 2 diabetes mellitus without complications: Secondary | ICD-10-CM

## 2022-06-13 DIAGNOSIS — N390 Urinary tract infection, site not specified: Secondary | ICD-10-CM

## 2022-06-13 DIAGNOSIS — E3120 Multiple endocrine neoplasia [MEN] syndrome, unspecified: Secondary | ICD-10-CM

## 2022-06-13 DIAGNOSIS — IMO0002 Squamous cell carcinoma: Secondary | ICD-10-CM

## 2022-06-13 DIAGNOSIS — E039 Hypothyroidism, unspecified: Secondary | ICD-10-CM

## 2022-06-13 DIAGNOSIS — B999 Unspecified infectious disease: Secondary | ICD-10-CM

## 2022-06-13 DIAGNOSIS — W19XXXA Unspecified fall, initial encounter: Secondary | ICD-10-CM

## 2022-06-13 DIAGNOSIS — M545 Chronic lower back pain: Secondary | ICD-10-CM

## 2022-06-13 DIAGNOSIS — E041 Nontoxic single thyroid nodule: Secondary | ICD-10-CM

## 2022-06-13 DIAGNOSIS — C73 Malignant neoplasm of thyroid gland: Secondary | ICD-10-CM

## 2022-06-13 DIAGNOSIS — K3184 Gastroparesis: Secondary | ICD-10-CM

## 2022-06-13 DIAGNOSIS — G629 Polyneuropathy, unspecified: Secondary | ICD-10-CM

## 2022-06-13 DIAGNOSIS — N189 Chronic kidney disease, unspecified: Secondary | ICD-10-CM

## 2022-06-13 DIAGNOSIS — C4490 Unspecified malignant neoplasm of skin, unspecified: Secondary | ICD-10-CM

## 2022-06-13 DIAGNOSIS — R2689 Other abnormalities of gait and mobility: Secondary | ICD-10-CM

## 2022-06-13 DIAGNOSIS — A498 Other bacterial infections of unspecified site: Secondary | ICD-10-CM

## 2022-06-13 MED ADMIN — PANTOPRAZOLE 40 MG PO TBEC [80436]: 40 mg | ORAL | @ 01:00:00 | NDC 65862056099

## 2022-06-13 MED ADMIN — LEVOTHYROXINE 175 MCG PO TAB [10406]: 350 ug | ORAL | @ 11:00:00 | NDC 00904695761

## 2022-06-13 MED ADMIN — POLYETHYLENE GLYCOL 3350 17 GRAM PO PWPK [25424]: 17 g | ORAL | @ 01:00:00 | NDC 00904693186

## 2022-06-13 MED ADMIN — SODIUM CHLORIDE 0.9 % IV PGBK (MB+) [95161]: 4.5 g | INTRAVENOUS | @ 01:00:00 | NDC 00338915930

## 2022-06-13 MED ADMIN — SODIUM CHLORIDE 0.9 % IV PGBK (MB+) [95161]: 4.5 g | INTRAVENOUS | @ 07:00:00 | Stop: 2022-06-13 | NDC 00338915930

## 2022-06-13 MED ADMIN — ACETAMINOPHEN 1,000 MG/100 ML (10 MG/ML) IV SOLN [305632]: 1000 mg | INTRAVENOUS | @ 21:00:00 | NDC 00264410090

## 2022-06-13 MED ADMIN — OXYCODONE 5 MG PO TAB [10814]: 5 mg | ORAL | @ 14:00:00 | NDC 00904696661

## 2022-06-13 MED ADMIN — METOPROLOL TARTRATE 25 MG PO TAB [37637]: 25 mg | ORAL | @ 14:00:00 | NDC 51079025501

## 2022-06-13 MED ADMIN — ENOXAPARIN 40 MG/0.4 ML SC SYRG [85052]: 40 mg | SUBCUTANEOUS | @ 21:00:00 | NDC 71288043382

## 2022-06-13 MED ADMIN — SODIUM CHLORIDE 0.9 % IV SOLP [27838]: 1000.000 mL | INTRAVENOUS | @ 14:00:00 | NDC 00338004904

## 2022-06-13 MED ADMIN — SENNOSIDES-DOCUSATE SODIUM 8.6-50 MG PO TAB [40926]: 1 | ORAL | @ 01:00:00 | NDC 00536124810

## 2022-06-13 MED ADMIN — HEPARIN, PORCINE (PF) 5,000 UNIT/0.5 ML IJ SYRG [95535]: 5000 [IU] | SUBCUTANEOUS | @ 11:00:00 | Stop: 2022-06-13 | NDC 00409131611

## 2022-06-13 MED ADMIN — HEPARIN, PORCINE (PF) 5,000 UNIT/0.5 ML IJ SYRG [95535]: 5000 [IU] | SUBCUTANEOUS | @ 02:00:00 | NDC 00409131611

## 2022-06-13 MED ADMIN — GABAPENTIN 300 MG PO CAP [18308]: 600 mg | ORAL | @ 01:00:00 | NDC 67877022305

## 2022-06-13 MED ADMIN — ALVIMOPAN 12 MG PO CAP [170036]: 12 mg | ORAL | @ 14:00:00 | Stop: 2022-06-19 | NDC 00591231245

## 2022-06-13 MED ADMIN — METOPROLOL TARTRATE 25 MG PO TAB [37637]: 25 mg | ORAL | @ 01:00:00 | NDC 51079025501

## 2022-06-13 MED ADMIN — LIDOCAINE 5 % TP PTMD [80759]: 1 | TOPICAL | @ 14:00:00 | NDC 00591352511

## 2022-06-13 MED ADMIN — BUPROPION XL 150 MG PO TB24 [88619]: 150 mg | ORAL | @ 15:00:00 | NDC 68180031909

## 2022-06-13 MED ADMIN — POLYETHYLENE GLYCOL 3350 17 GRAM PO PWPK [25424]: 17 g | ORAL | @ 14:00:00 | NDC 00904693186

## 2022-06-13 MED ADMIN — METHOCARBAMOL 750 MG PO TAB [4972]: 750 mg | ORAL | @ 02:00:00 | NDC 70010077001

## 2022-06-13 MED ADMIN — PANTOPRAZOLE 40 MG PO TBEC [80436]: 40 mg | ORAL | @ 14:00:00 | NDC 65862056099

## 2022-06-13 MED ADMIN — OXYCODONE 5 MG PO TAB [10814]: 10 mg | ORAL | @ 21:00:00 | NDC 00904696661

## 2022-06-13 MED ADMIN — METHOCARBAMOL 750 MG PO TAB [4972]: 750 mg | ORAL | @ 11:00:00 | Stop: 2022-06-13 | NDC 70010077001

## 2022-06-13 MED ADMIN — PIPERACILLIN-TAZOBACTAM 4.5 GRAM IV SOLR [80419]: 4.5 g | INTRAVENOUS | @ 01:00:00 | NDC 60505615900

## 2022-06-13 MED ADMIN — POTASSIUM, SODIUM PHOSPHATES 280-160-250 MG PO PWPK [166235]: 2 | ORAL | @ 16:00:00 | Stop: 2022-06-13 | NDC 71351001099

## 2022-06-13 MED ADMIN — ATORVASTATIN 20 MG PO TAB [77412]: 20 mg | ORAL | @ 14:00:00 | NDC 67877051290

## 2022-06-13 MED ADMIN — SERTRALINE 100 MG PO TAB [78401]: 100 mg | ORAL | @ 14:00:00 | NDC 65862001330

## 2022-06-13 MED ADMIN — ALVIMOPAN 12 MG PO CAP [170036]: 12 mg | ORAL | @ 01:00:00 | Stop: 2022-06-19 | NDC 00591231245

## 2022-06-13 MED ADMIN — ACETAMINOPHEN 1,000 MG/100 ML (10 MG/ML) IV SOLN [305632]: 1000 mg | INTRAVENOUS | @ 14:00:00 | NDC 00264410090

## 2022-06-13 MED ADMIN — LACTATED RINGERS IV SOLP [4318]: 1000.000 mL | INTRAVENOUS | @ 08:00:00 | Stop: 2022-06-13 | NDC 00338011704

## 2022-06-13 MED ADMIN — CEPHALEXIN 500 MG PO CAP [9500]: 500 mg | ORAL | @ 14:00:00 | NDC 50268015211

## 2022-06-13 MED ADMIN — GABAPENTIN 300 MG PO CAP [18308]: 600 mg | ORAL | @ 14:00:00 | NDC 67877022305

## 2022-06-13 MED ADMIN — PIPERACILLIN-TAZOBACTAM 4.5 GRAM IV SOLR [80419]: 4.5 g | INTRAVENOUS | @ 07:00:00 | Stop: 2022-06-13 | NDC 60505615900

## 2022-06-13 MED ADMIN — SENNOSIDES-DOCUSATE SODIUM 8.6-50 MG PO TAB [40926]: 1 | ORAL | @ 14:00:00 | NDC 00536124810

## 2022-06-13 MED ADMIN — ONDANSETRON HCL (PF) 4 MG/2 ML IJ SOLN [136012]: 8 mg | INTRAVENOUS | @ 16:00:00 | NDC 72266012301

## 2022-06-13 MED ADMIN — SIMETHICONE 80 MG PO CHEW [7227]: 80 mg | ORAL | @ 14:00:00 | NDC 00904720660

## 2022-06-14 ENCOUNTER — Encounter
Admit: 2022-06-14 | Discharge: 2022-06-14 | Payer: MEDICARE | Primary: Student in an Organized Health Care Education/Training Program

## 2022-06-14 MED ADMIN — POLYETHYLENE GLYCOL 3350 17 GRAM PO PWPK [25424]: 17 g | ORAL | @ 14:00:00 | NDC 00904693186

## 2022-06-14 MED ADMIN — GABAPENTIN 300 MG PO CAP [18308]: 600 mg | ORAL | @ 14:00:00 | NDC 67877022305

## 2022-06-14 MED ADMIN — PANTOPRAZOLE 40 MG PO TBEC [80436]: 40 mg | ORAL | @ 14:00:00 | NDC 65862056099

## 2022-06-14 MED ADMIN — METOPROLOL TARTRATE 25 MG PO TAB [37637]: 25 mg | ORAL | @ 02:00:00 | NDC 51079025501

## 2022-06-14 MED ADMIN — ACETAMINOPHEN 1,000 MG/100 ML (10 MG/ML) IV SOLN [305632]: 1000 mg | INTRAVENOUS | @ 02:00:00 | NDC 00264410090

## 2022-06-14 MED ADMIN — PANTOPRAZOLE 40 MG PO TBEC [80436]: 40 mg | ORAL | @ 02:00:00 | NDC 65862056099

## 2022-06-14 MED ADMIN — SODIUM PHOSPHATE 3 MMOL/ML IV SOLN [7351]: 30 mmol | INTRAVENOUS | @ 12:00:00 | Stop: 2022-06-14 | NDC 63323088401

## 2022-06-14 MED ADMIN — GABAPENTIN 300 MG PO CAP [18308]: 600 mg | ORAL | @ 02:00:00 | NDC 67877022305

## 2022-06-14 MED ADMIN — BUPROPION XL 150 MG PO TB24 [88619]: 150 mg | ORAL | @ 14:00:00 | NDC 68180031909

## 2022-06-14 MED ADMIN — ENOXAPARIN 40 MG/0.4 ML SC SYRG [85052]: 40 mg | SUBCUTANEOUS | @ 14:00:00 | NDC 71288043382

## 2022-06-14 MED ADMIN — BACITRACIN ZINC 500 UNIT/GRAM TP OINT [13818]: TOPICAL | @ 14:00:00 | NDC 51672207502

## 2022-06-14 MED ADMIN — ALVIMOPAN 12 MG PO CAP [170036]: 12 mg | ORAL | @ 04:00:00 | Stop: 2022-06-19 | NDC 00591231245

## 2022-06-14 MED ADMIN — METOPROLOL TARTRATE 25 MG PO TAB [37637]: 25 mg | ORAL | @ 14:00:00 | NDC 51079025501

## 2022-06-14 MED ADMIN — BUPROPION XL 150 MG PO TB24 [88619]: 150 mg | ORAL | @ 04:00:00 | NDC 68180031909

## 2022-06-14 MED ADMIN — DEXTROSE 5% IN WATER IV SOLP [2364]: 30 mmol | INTRAVENOUS | @ 12:00:00 | Stop: 2022-06-14 | NDC 00338001702

## 2022-06-14 MED ADMIN — SENNOSIDES-DOCUSATE SODIUM 8.6-50 MG PO TAB [40926]: 1 | ORAL | @ 02:00:00 | NDC 00536124810

## 2022-06-14 MED ADMIN — ACETAMINOPHEN 1,000 MG/100 ML (10 MG/ML) IV SOLN [305632]: 1000 mg | INTRAVENOUS | @ 14:00:00 | NDC 00264410090

## 2022-06-14 MED ADMIN — CEPHALEXIN 500 MG PO CAP [9500]: 500 mg | ORAL | @ 14:00:00 | NDC 50268015211

## 2022-06-14 MED ADMIN — LIDOCAINE 5 % TP PTMD [80759]: 1 | TOPICAL | @ 14:00:00 | NDC 00591352511

## 2022-06-14 MED ADMIN — SODIUM CHLORIDE 0.9 % IV SOLP [27838]: 1000.000 mL | INTRAVENOUS | @ 04:00:00 | NDC 00338004904

## 2022-06-14 MED ADMIN — TRAZODONE 50 MG PO TAB [8085]: 50 mg | ORAL | @ 04:00:00 | NDC 00904686861

## 2022-06-14 MED ADMIN — CEPHALEXIN 500 MG PO CAP [9500]: 500 mg | ORAL | @ 02:00:00 | NDC 50268015211

## 2022-06-14 MED ADMIN — SENNOSIDES-DOCUSATE SODIUM 8.6-50 MG PO TAB [40926]: 1 | ORAL | @ 14:00:00 | NDC 00536124810

## 2022-06-14 MED ADMIN — SERTRALINE 100 MG PO TAB [78401]: 100 mg | ORAL | @ 14:00:00 | NDC 65862001330

## 2022-06-14 MED ADMIN — ALVIMOPAN 12 MG PO CAP [170036]: 12 mg | ORAL | @ 14:00:00 | Stop: 2022-06-14 | NDC 00591231245

## 2022-06-14 MED ADMIN — ACETAMINOPHEN 1,000 MG/100 ML (10 MG/ML) IV SOLN [305632]: 1000 mg | INTRAVENOUS | @ 21:00:00 | NDC 00264410090

## 2022-06-14 MED ADMIN — OXYCODONE 5 MG PO TAB [10814]: 10 mg | ORAL | NDC 00904696661

## 2022-06-14 MED ADMIN — MELATONIN 5 MG PO TAB [168576]: 5 mg | ORAL | @ 02:00:00 | NDC 77333052025

## 2022-06-14 MED ADMIN — ATORVASTATIN 20 MG PO TAB [77412]: 20 mg | ORAL | @ 14:00:00 | NDC 67877051290

## 2022-06-15 ENCOUNTER — Encounter
Admit: 2022-06-15 | Discharge: 2022-06-15 | Payer: MEDICARE | Primary: Student in an Organized Health Care Education/Training Program

## 2022-06-15 MED ADMIN — BUPROPION XL 150 MG PO TB24 [88619]: 150 mg | ORAL | @ 14:00:00 | Stop: 2022-06-15 | NDC 68180031909

## 2022-06-15 MED ADMIN — PANTOPRAZOLE 40 MG PO TBEC [80436]: 40 mg | ORAL | @ 03:00:00 | NDC 65862056099

## 2022-06-15 MED ADMIN — PANTOPRAZOLE 40 MG PO TBEC [80436]: 40 mg | ORAL | @ 14:00:00 | Stop: 2022-06-15 | NDC 65862056099

## 2022-06-15 MED ADMIN — DEXTROSE 5% IN WATER IV SOLP [2364]: 30 mmol | INTRAVENOUS | @ 12:00:00 | Stop: 2022-06-15 | NDC 00338001702

## 2022-06-15 MED ADMIN — OXYCODONE 5 MG PO TAB [10814]: 10 mg | ORAL | @ 14:00:00 | Stop: 2022-06-15 | NDC 00904696661

## 2022-06-15 MED ADMIN — SODIUM PHOSPHATE 3 MMOL/ML IV SOLN [7351]: 30 mmol | INTRAVENOUS | @ 12:00:00 | Stop: 2022-06-15 | NDC 63323088401

## 2022-06-15 MED ADMIN — LEVOTHYROXINE 175 MCG PO TAB [10406]: 350 ug | ORAL | @ 11:00:00 | Stop: 2022-06-15 | NDC 00904695761

## 2022-06-15 MED ADMIN — SERTRALINE 100 MG PO TAB [78401]: 100 mg | ORAL | @ 14:00:00 | Stop: 2022-06-15 | NDC 65862001330

## 2022-06-15 MED ADMIN — OXYCODONE 5 MG PO TAB [10814]: 10 mg | ORAL | @ 03:00:00 | NDC 00904696661

## 2022-06-15 MED ADMIN — GABAPENTIN 300 MG PO CAP [18308]: 600 mg | ORAL | @ 03:00:00 | NDC 67877022305

## 2022-06-15 MED ADMIN — ACETAMINOPHEN 1,000 MG/100 ML (10 MG/ML) IV SOLN [305632]: 1000 mg | INTRAVENOUS | @ 14:00:00 | Stop: 2022-06-15 | NDC 00264410090

## 2022-06-15 MED ADMIN — BUPROPION XL 150 MG PO TB24 [88619]: 150 mg | ORAL | @ 03:00:00 | NDC 68180031909

## 2022-06-15 MED ADMIN — MELATONIN 5 MG PO TAB [168576]: 5 mg | ORAL | @ 03:00:00 | NDC 77333052025

## 2022-06-15 MED ADMIN — ENOXAPARIN 40 MG/0.4 ML SC SYRG [85052]: 40 mg | SUBCUTANEOUS | @ 14:00:00 | Stop: 2022-06-15 | NDC 71288043382

## 2022-06-15 MED ADMIN — GABAPENTIN 300 MG PO CAP [18308]: 600 mg | ORAL | @ 14:00:00 | Stop: 2022-06-15 | NDC 67877022305

## 2022-06-15 MED ADMIN — METOPROLOL TARTRATE 25 MG PO TAB [37637]: 25 mg | ORAL | @ 03:00:00 | NDC 51079025501

## 2022-06-15 MED ADMIN — CEPHALEXIN 500 MG PO CAP [9500]: 500 mg | ORAL | @ 03:00:00 | NDC 50268015211

## 2022-06-15 MED ADMIN — LIDOCAINE 5 % TP PTMD [80759]: 1 | TOPICAL | @ 14:00:00 | Stop: 2022-06-15 | NDC 00591352511

## 2022-06-15 MED ADMIN — METOPROLOL TARTRATE 25 MG PO TAB [37637]: 25 mg | ORAL | @ 14:00:00 | Stop: 2022-06-15 | NDC 51079025501

## 2022-06-15 MED ADMIN — ACETAMINOPHEN 1,000 MG/100 ML (10 MG/ML) IV SOLN [305632]: 1000 mg | INTRAVENOUS | @ 09:00:00 | Stop: 2022-06-15 | NDC 00264410090

## 2022-06-15 MED ADMIN — TRAZODONE 50 MG PO TAB [8085]: 50 mg | ORAL | @ 03:00:00 | NDC 00904686861

## 2022-06-15 MED ADMIN — CEPHALEXIN 500 MG PO CAP [9500]: 500 mg | ORAL | @ 14:00:00 | Stop: 2022-06-15 | NDC 50268015211

## 2022-06-15 MED ADMIN — SENNOSIDES-DOCUSATE SODIUM 8.6-50 MG PO TAB [40926]: 1 | ORAL | @ 14:00:00 | Stop: 2022-06-15 | NDC 00536124810

## 2022-06-15 MED ADMIN — ACETAMINOPHEN 1,000 MG/100 ML (10 MG/ML) IV SOLN [305632]: 1000 mg | INTRAVENOUS | @ 03:00:00 | NDC 00264410090

## 2022-06-15 MED ADMIN — ATORVASTATIN 20 MG PO TAB [77412]: 20 mg | ORAL | @ 14:00:00 | Stop: 2022-06-15 | NDC 67877051290

## 2022-06-15 NOTE — Telephone Encounter
Outgoing fax sent to [facility] [phone] containing the order for Reclast infusion.

## 2022-06-15 NOTE — Telephone Encounter
Notification received fax submitted successfully.  3/22 2:04 PM    Encounter Closed

## 2022-06-27 ENCOUNTER — Encounter
Admit: 2022-06-27 | Discharge: 2022-06-27 | Payer: MEDICARE | Primary: Student in an Organized Health Care Education/Training Program

## 2022-06-27 NOTE — Telephone Encounter
Carmell Austria, APRN with [facility] called. She states that patient's surgical incision has some separation and she thinks he has a seroma. No signs of infection. Three staples have dislodged. They obtained CT scan. She would like to know if Dr. Oren Bracket would like to pack wound. If so, she thinks that some "fascia will need to be opened up more" in order for them to pack. Let her know I will route to Dr. Oren Bracket to notify and further advise. Confirmed post op appointment for Friday.

## 2022-06-27 NOTE — Telephone Encounter
Cheryl with [facility] in [place], [place] called. She would like to know if patient's appointment is included in the global surgery payment. She also states that nursing reported that incision "is not looking well" and is requesting that Dr. Oren Bracket assesses during appointment prior to removing staples. Patient had CT scan done and Elnita Maxwell will fax report and cloud images. Provided her financial counseling phone number to contact to further advise on billing question. Will ensure that we receive CT report/images and notified that Dr. Oren Bracket will evaluate incision at appointment. No further questions.

## 2022-06-29 ENCOUNTER — Ambulatory Visit
Admit: 2022-06-29 | Discharge: 2022-06-30 | Payer: MEDICARE | Primary: Student in an Organized Health Care Education/Training Program

## 2022-06-29 ENCOUNTER — Encounter
Admit: 2022-06-29 | Discharge: 2022-06-29 | Payer: MEDICARE | Primary: Student in an Organized Health Care Education/Training Program

## 2022-06-29 DIAGNOSIS — A498 Other bacterial infections of unspecified site: Secondary | ICD-10-CM

## 2022-06-29 DIAGNOSIS — D539 Nutritional anemia, unspecified: Secondary | ICD-10-CM

## 2022-06-29 DIAGNOSIS — C73 Malignant neoplasm of thyroid gland: Secondary | ICD-10-CM

## 2022-06-29 DIAGNOSIS — K3184 Gastroparesis: Secondary | ICD-10-CM

## 2022-06-29 DIAGNOSIS — K219 Gastro-esophageal reflux disease without esophagitis: Secondary | ICD-10-CM

## 2022-06-29 DIAGNOSIS — N400 Enlarged prostate without lower urinary tract symptoms: Secondary | ICD-10-CM

## 2022-06-29 DIAGNOSIS — N189 Chronic kidney disease, unspecified: Secondary | ICD-10-CM

## 2022-06-29 DIAGNOSIS — K319 Disease of stomach and duodenum, unspecified: Secondary | ICD-10-CM

## 2022-06-29 DIAGNOSIS — R519 Generalized headaches: Secondary | ICD-10-CM

## 2022-06-29 DIAGNOSIS — Z9889 Other specified postprocedural states: Secondary | ICD-10-CM

## 2022-06-29 DIAGNOSIS — B999 Unspecified infectious disease: Secondary | ICD-10-CM

## 2022-06-29 DIAGNOSIS — W19XXXA Unspecified fall, initial encounter: Secondary | ICD-10-CM

## 2022-06-29 DIAGNOSIS — M1990 Unspecified osteoarthritis, unspecified site: Secondary | ICD-10-CM

## 2022-06-29 DIAGNOSIS — N529 Male erectile dysfunction, unspecified: Secondary | ICD-10-CM

## 2022-06-29 DIAGNOSIS — K5732 Diverticulitis of large intestine without perforation or abscess without bleeding: Secondary | ICD-10-CM

## 2022-06-29 DIAGNOSIS — C4490 Unspecified malignant neoplasm of skin, unspecified: Secondary | ICD-10-CM

## 2022-06-29 DIAGNOSIS — K2270 Barrett's esophagus without dysplasia: Secondary | ICD-10-CM

## 2022-06-29 DIAGNOSIS — E041 Nontoxic single thyroid nodule: Secondary | ICD-10-CM

## 2022-06-29 DIAGNOSIS — N200 Calculus of kidney: Secondary | ICD-10-CM

## 2022-06-29 DIAGNOSIS — Z8719 Personal history of other diseases of the digestive system: Secondary | ICD-10-CM

## 2022-06-29 DIAGNOSIS — M545 Chronic lower back pain: Secondary | ICD-10-CM

## 2022-06-29 DIAGNOSIS — F419 Anxiety disorder, unspecified: Secondary | ICD-10-CM

## 2022-06-29 DIAGNOSIS — I10 Essential (primary) hypertension: Secondary | ICD-10-CM

## 2022-06-29 DIAGNOSIS — R2689 Other abnormalities of gait and mobility: Secondary | ICD-10-CM

## 2022-06-29 DIAGNOSIS — G629 Polyneuropathy, unspecified: Secondary | ICD-10-CM

## 2022-06-29 DIAGNOSIS — N390 Urinary tract infection, site not specified: Secondary | ICD-10-CM

## 2022-06-29 DIAGNOSIS — E119 Type 2 diabetes mellitus without complications: Secondary | ICD-10-CM

## 2022-06-29 DIAGNOSIS — M549 Dorsalgia, unspecified: Secondary | ICD-10-CM

## 2022-06-29 DIAGNOSIS — E3120 Multiple endocrine neoplasia [MEN] syndrome, unspecified: Secondary | ICD-10-CM

## 2022-06-29 DIAGNOSIS — E785 Hyperlipidemia, unspecified: Secondary | ICD-10-CM

## 2022-06-29 DIAGNOSIS — N62 Hypertrophy of breast: Secondary | ICD-10-CM

## 2022-06-29 DIAGNOSIS — IMO0002 Squamous cell carcinoma: Secondary | ICD-10-CM

## 2022-06-29 DIAGNOSIS — E039 Hypothyroidism, unspecified: Secondary | ICD-10-CM

## 2022-07-23 ENCOUNTER — Encounter
Admit: 2022-07-23 | Discharge: 2022-07-23 | Payer: MEDICARE | Primary: Student in an Organized Health Care Education/Training Program

## 2022-07-23 ENCOUNTER — Emergency Department: Admit: 2022-07-23 | Discharge: 2022-07-23 | Payer: MEDICARE

## 2022-07-23 ENCOUNTER — Inpatient Hospital Stay: Admit: 2022-07-23 | Payer: MEDICARE

## 2022-07-23 DIAGNOSIS — T83511A Infection and inflammatory reaction due to indwelling urethral catheter, initial encounter: Secondary | ICD-10-CM

## 2022-07-23 DIAGNOSIS — N179 Acute kidney failure, unspecified: Secondary | ICD-10-CM

## 2022-07-23 LAB — COMPREHENSIVE METABOLIC PANEL
ALBUMIN: 4.5 g/dL (ref 3.5–5.0)
ALK PHOSPHATASE: 131 U/L — ABNORMAL HIGH (ref 25–110)
ALT: 15 U/L (ref 7–56)
ANION GAP: 12 (ref 3–12)
AST: 37 U/L (ref 7–40)
CO2: 17 MMOL/L — ABNORMAL LOW (ref 21–30)
EGFR: 40 mL/min — ABNORMAL LOW (ref >60)
SODIUM: 137 MMOL/L (ref 137–147)
TOTAL BILIRUBIN: 0.3 mg/dL (ref 0.3–1.2)

## 2022-07-23 LAB — POC GLUCOSE: POC GLUCOSE: 83 mg/dL — ABNORMAL HIGH (ref 70–100)

## 2022-07-23 LAB — URINALYSIS DIPSTICK REFLEX TO CULTURE
URINE ASCORBIC ACID, UA: POSITIVE % — AB (ref 0–2)
URINE BILE: NEGATIVE FL (ref 7–11)

## 2022-07-23 LAB — CBC AND DIFF
ABSOLUTE EOS COUNT: 0.1 K/UL (ref 0–0.45)
ABSOLUTE LYMPH COUNT: 2.2 K/UL (ref 1.0–4.8)
ABSOLUTE MONO COUNT: 0.7 K/UL (ref 0–0.80)
MDW (MONOCYTE DISTRIBUTION WIDTH): 19 (ref <20.7)
RBC COUNT: 4.4 M/UL (ref 4.4–5.5)
WBC COUNT: 10 K/UL (ref 4.5–11.0)

## 2022-07-23 LAB — URINALYSIS MICROSCOPIC REFLEX TO CULTURE

## 2022-07-23 LAB — POC CREATININE, RAD: CREATININE, POC: 1.8 mg/dL — ABNORMAL HIGH (ref 0.4–1.24)

## 2022-07-23 LAB — POC LACTATE
LACTIC ACID POC: 2 MMOL/L (ref 0.5–2.0)
LACTIC ACID POC: 3.5 MMOL/L — ABNORMAL HIGH (ref 0.5–2.0)

## 2022-07-23 LAB — MAGNESIUM: MAGNESIUM: 2.8 mg/dL — ABNORMAL HIGH (ref 1.6–2.6)

## 2022-07-23 LAB — PHOSPHORUS: PHOSPHORUS: 3.3 mg/dL — ABNORMAL LOW (ref 2.0–4.5)

## 2022-07-23 LAB — PROTIME INR (PT): PROTIME: 12 s (ref 10.2–12.9)

## 2022-07-23 LAB — PTT (APTT): PTT: 24 s — AB (ref 24.0–36.5)

## 2022-07-23 MED ORDER — LACTATED RINGERS IV SOLP
1000 mL | INTRAVENOUS | 0 refills | Status: CP
Start: 2022-07-23 — End: ?
  Administered 2022-07-24: 03:00:00 1000 mL via INTRAVENOUS

## 2022-07-23 MED ORDER — LACTATED RINGERS IV SOLP
1000 mL | INTRAVENOUS | 0 refills | Status: CP
Start: 2022-07-23 — End: ?
  Administered 2022-07-24: 1000 mL via INTRAVENOUS

## 2022-07-23 MED ORDER — IOHEXOL 350 MG IODINE/ML IV SOLN
100 mL | Freq: Once | INTRAVENOUS | 0 refills | Status: CP
Start: 2022-07-23 — End: ?
  Administered 2022-07-24: 01:00:00 100 mL via INTRAVENOUS

## 2022-07-23 MED ORDER — CEFTRIAXONE INJ 1GM IVP
1 g | Freq: Once | INTRAVENOUS | 0 refills | Status: CP
Start: 2022-07-23 — End: ?
  Administered 2022-07-24: 04:00:00 1 g via INTRAVENOUS

## 2022-07-23 MED ORDER — FENTANYL CITRATE (PF) 50 MCG/ML IJ SOLN
50 ug | Freq: Once | INTRAVENOUS | 0 refills | Status: CP
Start: 2022-07-23 — End: ?
  Administered 2022-07-24: 50 ug via INTRAVENOUS

## 2022-07-23 MED ORDER — SODIUM CHLORIDE 0.9 % IJ SOLN
50 mL | Freq: Once | INTRAVENOUS | 0 refills | Status: CP
Start: 2022-07-23 — End: ?
  Administered 2022-07-24: 01:00:00 50 mL via INTRAVENOUS

## 2022-07-23 MED ORDER — ONDANSETRON 4 MG PO TBDI
4 mg | Freq: Once | ORAL | 0 refills | Status: CP
Start: 2022-07-23 — End: ?
  Administered 2022-07-24: 4 mg via ORAL

## 2022-07-23 NOTE — Telephone Encounter
Patient's spouse, Sharl Ma, called to report that patient continues to have extreme nausea, loss of appetite, and abdominal tenderness. He has lost 15 lbs since his surgery. Requesting return call. I called and spoke with Sharl Ma, patient is currently in ER and has just been triaged. They would like to be evaluated in ER since patient states that this is the worst he has felt. Asked that Sharl Ma calls our office once he is discharged to provide update. She v/u; no further questions.

## 2022-07-23 NOTE — ED Notes
ED Initial Provider Note:    This patient was seen in the ED triage area to initiate and expedite the patients ED care when possible.    ED Chief Complaint:   Chief Complaint   Patient presents with    Post-Op Problem     Cystectomy on 18th, new ileostomy, new nausea starting last week, no PO intake denies vomiting, denies fevers at home, last BM last tuesday       S: Jimmy Waters is a 67 y.o. male who presents to the Emergency Department for diffuse lower abdominal pain.  He states he has been having this for several days with nausea that started last week.  He did have a plain film x-ray yesterday that did not show an obstructive pattern or belly full of stool.  He states he has not really been eating much so he has not had much in the way of bowel movements.  He does report he was told he had a urinary tract infection yesterday and typically requires IV antibiotics for his infections but has not been on antibiotics for a while.  Patient recently had urostomy placed after radical cystectomy.  Patient was placed on Cipro but has not received urine culture results.    PMHx:  Medical History:   Diagnosis Date    Anxiety disorder     Back pain     Barrett esophagus     BPH (benign prostatic hyperplasia)     Cancer of skin     Cataract     Chronic kidney disease unknown    Chronic lower back pain     Chronic UTI     Degenerative arthritis     Depression (disease)     Diverticulitis of colon (without mention of hemorrhage)(562.11)     type 2    DJD (degenerative joint disease)     DM type 2 (diabetes mellitus, type 2) (HCC)     Dyslipidemia     Erectile dysfunction 2021    Falls 2022    Gastroparesis     Generalized headaches 2022    GERD (gastroesophageal reflux disease)     Gynecomastia     History of dental problems     lower partial    History of Nissen fundoplication ~1990    HLD (hyperlipidemia)     HTN (hypertension)     Hx of arthroscopy of knee     Hypothyroidism     Infection     Kidney stones     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    O: Brief Physical: Diffuse lower abdominal tenderness, urostomy bag draining well, stoma pink and normal in appearance, no surrounding erythema or warmth to the abdomen    A/P: The patient was seen by me as an initial provider  in triage. A brief history and physical was obtained. My exam is intended to be an initial medial screening exam. Initial orders have been placed by me. My working diagnosis is urinary tract infection, constipation, diverticulitis, postoperative infection.    The patient is deemed appropriate for the main ED. The patient's care will be resumed by the ED provider care team once the patient is roomed in the ED. A more detailed / complete H&P will be documented by those providers.

## 2022-07-24 ENCOUNTER — Encounter
Admit: 2022-07-24 | Discharge: 2022-07-24 | Payer: MEDICARE | Primary: Student in an Organized Health Care Education/Training Program

## 2022-07-24 MED ORDER — POLYETHYLENE GLYCOL 3350 17 GRAM PO PWPK
1 | Freq: Every day | ORAL | 0 refills | Status: AC | PRN
Start: 2022-07-24 — End: ?
  Administered 2022-07-26: 03:00:00 17 g via ORAL

## 2022-07-24 MED ORDER — MELATONIN 5 MG PO TAB
5 mg | Freq: Every evening | ORAL | 0 refills | Status: AC | PRN
Start: 2022-07-24 — End: ?
  Administered 2022-07-25 – 2022-07-29 (×5): 5 mg via ORAL

## 2022-07-24 MED ORDER — HEPARIN, PORCINE (PF) 5,000 UNIT/0.5 ML IJ SYRG
5000 [IU] | SUBCUTANEOUS | 0 refills | Status: AC
Start: 2022-07-24 — End: ?
  Administered 2022-07-24 – 2022-07-27 (×10): 5000 [IU] via SUBCUTANEOUS

## 2022-07-24 MED ORDER — ONDANSETRON 4 MG PO TBDI
4 mg | ORAL | 0 refills | Status: AC | PRN
Start: 2022-07-24 — End: ?

## 2022-07-24 MED ORDER — ACETAMINOPHEN 325 MG PO TAB
650 mg | ORAL | 0 refills | Status: AC | PRN
Start: 2022-07-24 — End: ?
  Administered 2022-07-24: 14:00:00 650 mg via ORAL

## 2022-07-24 MED ORDER — ONDANSETRON HCL (PF) 4 MG/2 ML IJ SOLN
4 mg | INTRAVENOUS | 0 refills | Status: AC | PRN
Start: 2022-07-24 — End: ?

## 2022-07-24 MED ORDER — SENNOSIDES-DOCUSATE SODIUM 8.6-50 MG PO TAB
1 | Freq: Every day | ORAL | 0 refills | Status: AC | PRN
Start: 2022-07-24 — End: ?

## 2022-07-24 MED ADMIN — ATORVASTATIN 20 MG PO TAB [77412]: 20 mg | ORAL | @ 14:00:00 | NDC 00904629161

## 2022-07-24 MED ADMIN — OXYCODONE 5 MG PO TAB [10814]: 5 mg | ORAL | @ 19:00:00 | NDC 00406055223

## 2022-07-24 MED ADMIN — SENNOSIDES-DOCUSATE SODIUM 8.6-50 MG PO TAB [40926]: 2 | ORAL | @ 14:00:00 | NDC 00536124810

## 2022-07-24 MED ADMIN — INSULIN ASPART 100 UNIT/ML SC FLEXPEN [87504]: 8 [IU] | SUBCUTANEOUS | @ 19:00:00 | NDC 00169633910

## 2022-07-24 MED ADMIN — SERTRALINE 100 MG PO TAB [78401]: 100 mg | ORAL | @ 14:00:00 | Stop: 2022-07-24 | NDC 00904692661

## 2022-07-24 MED ADMIN — BUPROPION XL 150 MG PO TB24 [88619]: 300 mg | ORAL | @ 14:00:00 | NDC 68180031909

## 2022-07-24 MED ADMIN — PANTOPRAZOLE 40 MG PO TBEC [80436]: 40 mg | ORAL | @ 14:00:00 | NDC 00904647461

## 2022-07-24 MED ADMIN — ONDANSETRON HCL (PF) 4 MG/2 ML IJ SOLN [136012]: 8 mg | INTRAVENOUS | @ 15:00:00 | NDC 00641607801

## 2022-07-24 MED ADMIN — LACTATED RINGERS IV SOLP [4318]: 1000.000 mL | INTRAVENOUS | @ 14:00:00 | Stop: 2022-07-25 | NDC 00338011704

## 2022-07-24 MED ADMIN — OXYCODONE 5 MG PO TAB [10814]: 5 mg | ORAL | @ 15:00:00 | NDC 00406055223

## 2022-07-24 MED ADMIN — METOPROLOL TARTRATE 25 MG PO TAB [37637]: 25 mg | ORAL | @ 14:00:00 | NDC 51079025501

## 2022-07-24 MED ADMIN — OXYCODONE 5 MG PO TAB [10814]: 5 mg | ORAL | @ 23:00:00 | NDC 00406055223

## 2022-07-24 MED ADMIN — LACTATED RINGERS IV SOLP [4318]: 1000.000 mL | INTRAVENOUS | @ 23:00:00 | Stop: 2022-07-25 | NDC 00338011704

## 2022-07-24 MED ADMIN — CEFTRIAXONE INJ 1GM IVP [210253]: 1 g | INTRAVENOUS | @ 10:00:00 | Stop: 2022-07-24 | NDC 60505614800

## 2022-07-24 MED ADMIN — LEVOTHYROXINE 175 MCG PO TAB [10406]: 350 ug | ORAL | @ 11:00:00 | NDC 00904695761

## 2022-07-24 NOTE — ED Notes
67 y.o. male to ED46 with c/o post op problem. Pt states he got a ileostomy on 06/13/22 and was recovering very well, but states starting last week he started getting nauseous, dry-heaving and not being able to keep and food down. Pt states he has lost about 10lbs in the past week. Pt states he is able to drink a little amount of fluids. Pt states he feels weak, went to [facility] and they diagnosed him with a UTI but stated they did not know much about the surgery he had done and told him to go back to [place] for further interventions. Pt denies any CP, SOA, or dizziness. Pt is A&Ox4, VSS, NAD. Pt is sitting on cart, call light within reach. Pt denies any further questions or concerns at this time.

## 2022-07-24 NOTE — ED Notes
Pt report received from [place] II, Charity fundraiser. No further questions at this time. Will continue to monitor pt.

## 2022-07-25 ENCOUNTER — Encounter
Admit: 2022-07-25 | Discharge: 2022-07-25 | Payer: MEDICARE | Primary: Student in an Organized Health Care Education/Training Program

## 2022-07-25 ENCOUNTER — Inpatient Hospital Stay
Admit: 2022-07-25 | Discharge: 2022-07-25 | Payer: MEDICARE | Primary: Student in an Organized Health Care Education/Training Program

## 2022-07-25 DIAGNOSIS — IMO0002 Squamous cell carcinoma: Secondary | ICD-10-CM

## 2022-07-25 DIAGNOSIS — K319 Disease of stomach and duodenum, unspecified: Secondary | ICD-10-CM

## 2022-07-25 DIAGNOSIS — M549 Dorsalgia, unspecified: Secondary | ICD-10-CM

## 2022-07-25 DIAGNOSIS — G629 Polyneuropathy, unspecified: Secondary | ICD-10-CM

## 2022-07-25 DIAGNOSIS — B999 Unspecified infectious disease: Secondary | ICD-10-CM

## 2022-07-25 DIAGNOSIS — A498 Other bacterial infections of unspecified site: Secondary | ICD-10-CM

## 2022-07-25 DIAGNOSIS — E3120 Multiple endocrine neoplasia [MEN] syndrome, unspecified: Secondary | ICD-10-CM

## 2022-07-25 DIAGNOSIS — R519 Generalized headaches: Secondary | ICD-10-CM

## 2022-07-25 DIAGNOSIS — N200 Calculus of kidney: Secondary | ICD-10-CM

## 2022-07-25 DIAGNOSIS — W19XXXA Unspecified fall, initial encounter: Secondary | ICD-10-CM

## 2022-07-25 DIAGNOSIS — K219 Gastro-esophageal reflux disease without esophagitis: Secondary | ICD-10-CM

## 2022-07-25 DIAGNOSIS — C73 Malignant neoplasm of thyroid gland: Secondary | ICD-10-CM

## 2022-07-25 DIAGNOSIS — R2689 Other abnormalities of gait and mobility: Secondary | ICD-10-CM

## 2022-07-25 DIAGNOSIS — N400 Enlarged prostate without lower urinary tract symptoms: Secondary | ICD-10-CM

## 2022-07-25 DIAGNOSIS — E041 Nontoxic single thyroid nodule: Secondary | ICD-10-CM

## 2022-07-25 DIAGNOSIS — C4490 Unspecified malignant neoplasm of skin, unspecified: Secondary | ICD-10-CM

## 2022-07-25 DIAGNOSIS — E785 Hyperlipidemia, unspecified: Secondary | ICD-10-CM

## 2022-07-25 DIAGNOSIS — I10 Essential (primary) hypertension: Secondary | ICD-10-CM

## 2022-07-25 DIAGNOSIS — F419 Anxiety disorder, unspecified: Secondary | ICD-10-CM

## 2022-07-25 DIAGNOSIS — N529 Male erectile dysfunction, unspecified: Secondary | ICD-10-CM

## 2022-07-25 DIAGNOSIS — D539 Nutritional anemia, unspecified: Secondary | ICD-10-CM

## 2022-07-25 DIAGNOSIS — M1990 Unspecified osteoarthritis, unspecified site: Secondary | ICD-10-CM

## 2022-07-25 DIAGNOSIS — N189 Chronic kidney disease, unspecified: Secondary | ICD-10-CM

## 2022-07-25 DIAGNOSIS — Z9889 Other specified postprocedural states: Secondary | ICD-10-CM

## 2022-07-25 DIAGNOSIS — Z8719 Personal history of other diseases of the digestive system: Secondary | ICD-10-CM

## 2022-07-25 DIAGNOSIS — K3184 Gastroparesis: Secondary | ICD-10-CM

## 2022-07-25 DIAGNOSIS — E039 Hypothyroidism, unspecified: Secondary | ICD-10-CM

## 2022-07-25 DIAGNOSIS — N390 Urinary tract infection, site not specified: Secondary | ICD-10-CM

## 2022-07-25 DIAGNOSIS — N62 Hypertrophy of breast: Secondary | ICD-10-CM

## 2022-07-25 DIAGNOSIS — K2270 Barrett's esophagus without dysplasia: Secondary | ICD-10-CM

## 2022-07-25 DIAGNOSIS — E119 Type 2 diabetes mellitus without complications: Secondary | ICD-10-CM

## 2022-07-25 DIAGNOSIS — K5732 Diverticulitis of large intestine without perforation or abscess without bleeding: Secondary | ICD-10-CM

## 2022-07-25 DIAGNOSIS — M545 Chronic lower back pain: Secondary | ICD-10-CM

## 2022-07-25 MED ADMIN — SENNOSIDES-DOCUSATE SODIUM 8.6-50 MG PO TAB [40926]: 2 | ORAL | @ 15:00:00 | NDC 00536124810

## 2022-07-25 MED ADMIN — DULOXETINE 30 MG PO CPDR [93424]: 30 mg | ORAL | @ 15:00:00 | NDC 00904704461

## 2022-07-25 MED ADMIN — OXYCODONE 5 MG PO TAB [10814]: 7.5 mg | ORAL | @ 20:00:00 | NDC 00406055223

## 2022-07-25 MED ADMIN — ONDANSETRON HCL (PF) 4 MG/2 ML IJ SOLN [136012]: 8 mg | INTRAVENOUS | @ 23:00:00 | NDC 00641607801

## 2022-07-25 MED ADMIN — LEVOTHYROXINE 175 MCG PO TAB [10406]: 350 ug | ORAL | @ 11:00:00 | Stop: 2022-07-25 | NDC 00904695761

## 2022-07-25 MED ADMIN — SODIUM CHLORIDE 0.9 % IV SOLP [27838]: 1750 mg | INTRAVENOUS | @ 03:00:00 | NDC 00338004902

## 2022-07-25 MED ADMIN — BUPROPION XL 150 MG PO TB24 [88619]: 300 mg | ORAL | @ 15:00:00 | NDC 68180031909

## 2022-07-25 MED ADMIN — SENNOSIDES-DOCUSATE SODIUM 8.6-50 MG PO TAB [40926]: 2 | ORAL | @ 03:00:00 | NDC 00536124810

## 2022-07-25 MED ADMIN — ATORVASTATIN 20 MG PO TAB [77412]: 20 mg | ORAL | @ 15:00:00 | NDC 00904629161

## 2022-07-25 MED ADMIN — OXYCODONE 5 MG PO TAB [10814]: 5 mg | ORAL | @ 16:00:00 | NDC 00406055223

## 2022-07-25 MED ADMIN — OXYCODONE 5 MG PO TAB [10814]: 5 mg | ORAL | @ 03:00:00 | NDC 00406055223

## 2022-07-25 MED ADMIN — SODIUM CHLORIDE 0.9 % IJ SOLN [7319]: 10 mL | INTRAVENOUS | @ 19:00:00 | Stop: 2022-07-25 | NDC 00409488820

## 2022-07-25 MED ADMIN — ACETAMINOPHEN 500 MG PO TAB [102]: 1000 mg | ORAL | @ 17:00:00 | NDC 00904673061

## 2022-07-25 MED ADMIN — PANTOPRAZOLE 40 MG PO TBEC [80436]: 40 mg | ORAL | @ 03:00:00 | NDC 00904647461

## 2022-07-25 MED ADMIN — LACTATED RINGERS IV SOLP [4318]: 1000.000 mL | INTRAVENOUS | @ 07:00:00 | Stop: 2022-07-25 | NDC 00338011704

## 2022-07-25 MED ADMIN — ACETAMINOPHEN 500 MG PO TAB [102]: 1000 mg | ORAL | @ 23:00:00 | NDC 00904673061

## 2022-07-25 MED ADMIN — INSULIN GLARGINE 100 UNIT/ML (3 ML) SC INJ PEN [163596]: 10 [IU] | SUBCUTANEOUS | @ 03:00:00 | NDC 00088221901

## 2022-07-25 MED ADMIN — OXYCODONE 5 MG PO TAB [10814]: 5 mg | ORAL | @ 07:00:00 | Stop: 2022-07-25 | NDC 00406055223

## 2022-07-25 MED ADMIN — ONDANSETRON HCL (PF) 4 MG/2 ML IJ SOLN [136012]: 8 mg | INTRAVENOUS | @ 11:00:00 | NDC 00641607801

## 2022-07-25 MED ADMIN — PANTOPRAZOLE 40 MG PO TBEC [80436]: 40 mg | ORAL | @ 15:00:00 | NDC 00904647461

## 2022-07-25 MED ADMIN — ONDANSETRON HCL (PF) 4 MG/2 ML IJ SOLN [136012]: 8 mg | INTRAVENOUS | @ 04:00:00 | NDC 00641607801

## 2022-07-25 MED ADMIN — PERFLUTREN LIPID MICROSPHERES 1.1 MG/ML IV SUSP [79178]: 5 mL | INTRAVENOUS | @ 19:00:00 | Stop: 2022-07-25 | NDC 11994001116

## 2022-07-25 MED ADMIN — CEFTRIAXONE INJ 1GM IVP [210253]: 2 g | INTRAVENOUS | @ 07:00:00 | Stop: 2022-07-25 | NDC 60505614800

## 2022-07-25 MED ADMIN — METOPROLOL TARTRATE 25 MG PO TAB [37637]: 25 mg | ORAL | @ 15:00:00 | NDC 62584026511

## 2022-07-25 MED ADMIN — VANCOMYCIN 5 GRAM IV SOLR [8444]: 1750 mg | INTRAVENOUS | @ 03:00:00 | NDC 71288002575

## 2022-07-25 MED ADMIN — OXYCODONE 5 MG PO TAB [10814]: 5 mg | ORAL | @ 11:00:00 | Stop: 2022-07-25 | NDC 00406055223

## 2022-07-25 MED ADMIN — METOPROLOL TARTRATE 25 MG PO TAB [37637]: 25 mg | ORAL | @ 03:00:00 | NDC 62584026511

## 2022-07-26 MED ADMIN — LEVOTHYROXINE 175 MCG PO TAB [10406]: 350 ug | ORAL | @ 11:00:00 | NDC 00904695761

## 2022-07-26 MED ADMIN — PROCHLORPERAZINE MALEATE 10 MG PO TAB [6582]: 10 mg | ORAL | @ 20:00:00 | NDC 50268068511

## 2022-07-26 MED ADMIN — METOPROLOL TARTRATE 25 MG PO TAB [37637]: 25 mg | ORAL | @ 01:00:00 | NDC 62584026511

## 2022-07-26 MED ADMIN — METOPROLOL TARTRATE 25 MG PO TAB [37637]: 25 mg | ORAL | @ 14:00:00 | NDC 62584026511

## 2022-07-26 MED ADMIN — OXYCODONE 5 MG PO TAB [10814]: 5 mg | ORAL | @ 14:00:00 | NDC 00406055223

## 2022-07-26 MED ADMIN — DULOXETINE 30 MG PO CPDR [93424]: 30 mg | ORAL | @ 14:00:00 | NDC 00904704461

## 2022-07-26 MED ADMIN — SENNOSIDES-DOCUSATE SODIUM 8.6-50 MG PO TAB [40926]: 2 | ORAL | @ 01:00:00 | NDC 00536124810

## 2022-07-26 MED ADMIN — PANTOPRAZOLE 40 MG PO TBEC [80436]: 40 mg | ORAL | @ 01:00:00 | NDC 00904647461

## 2022-07-26 MED ADMIN — PEG-ELECTROLYTE SOLN 420 GRAM PO SOLR [79142]: 4 L | ORAL | @ 15:00:00 | Stop: 2022-07-26 | NDC 64380076921

## 2022-07-26 MED ADMIN — BUPROPION XL 150 MG PO TB24 [88619]: 300 mg | ORAL | @ 14:00:00 | NDC 68180031909

## 2022-07-26 MED ADMIN — ATORVASTATIN 20 MG PO TAB [77412]: 20 mg | ORAL | @ 14:00:00 | NDC 00904629161

## 2022-07-26 MED ADMIN — ACETAMINOPHEN 500 MG PO TAB [102]: 1000 mg | ORAL | @ 14:00:00 | NDC 00904673061

## 2022-07-26 MED ADMIN — ONDANSETRON HCL (PF) 4 MG/2 ML IJ SOLN [136012]: 8 mg | INTRAVENOUS | @ 17:00:00 | NDC 00641607801

## 2022-07-26 MED ADMIN — OXYCODONE 5 MG PO TAB [10814]: 7.5 mg | ORAL | @ 01:00:00 | NDC 00406055223

## 2022-07-26 MED ADMIN — ACETAMINOPHEN 500 MG PO TAB [102]: 1000 mg | ORAL | @ 19:00:00 | NDC 00904673061

## 2022-07-26 MED ADMIN — SENNOSIDES-DOCUSATE SODIUM 8.6-50 MG PO TAB [40926]: 2 | ORAL | @ 14:00:00 | NDC 00536124810

## 2022-07-26 MED ADMIN — POTASSIUM CHLORIDE 20 MEQ PO TBTQ [35943]: 60 meq | ORAL | @ 14:00:00 | Stop: 2022-07-26 | NDC 00832532510

## 2022-07-26 MED ADMIN — OXYCODONE 5 MG PO TAB [10814]: 7.5 mg | ORAL | @ 20:00:00 | NDC 00406055223

## 2022-07-26 MED ADMIN — PANTOPRAZOLE 40 MG PO TBEC [80436]: 40 mg | ORAL | @ 14:00:00 | NDC 00904647461

## 2022-07-27 ENCOUNTER — Encounter
Admit: 2022-07-27 | Discharge: 2022-07-27 | Payer: MEDICARE | Primary: Student in an Organized Health Care Education/Training Program

## 2022-07-27 ENCOUNTER — Inpatient Hospital Stay
Admit: 2022-07-27 | Discharge: 2022-07-27 | Payer: MEDICARE | Primary: Student in an Organized Health Care Education/Training Program

## 2022-07-27 DIAGNOSIS — N62 Hypertrophy of breast: Secondary | ICD-10-CM

## 2022-07-27 DIAGNOSIS — R2689 Other abnormalities of gait and mobility: Secondary | ICD-10-CM

## 2022-07-27 DIAGNOSIS — Z9889 Other specified postprocedural states: Secondary | ICD-10-CM

## 2022-07-27 DIAGNOSIS — K219 Gastro-esophageal reflux disease without esophagitis: Secondary | ICD-10-CM

## 2022-07-27 DIAGNOSIS — K5732 Diverticulitis of large intestine without perforation or abscess without bleeding: Secondary | ICD-10-CM

## 2022-07-27 DIAGNOSIS — N529 Male erectile dysfunction, unspecified: Secondary | ICD-10-CM

## 2022-07-27 DIAGNOSIS — I10 Essential (primary) hypertension: Secondary | ICD-10-CM

## 2022-07-27 DIAGNOSIS — N200 Calculus of kidney: Secondary | ICD-10-CM

## 2022-07-27 DIAGNOSIS — E041 Nontoxic single thyroid nodule: Secondary | ICD-10-CM

## 2022-07-27 DIAGNOSIS — M549 Dorsalgia, unspecified: Secondary | ICD-10-CM

## 2022-07-27 DIAGNOSIS — N390 Urinary tract infection, site not specified: Secondary | ICD-10-CM

## 2022-07-27 DIAGNOSIS — C4490 Unspecified malignant neoplasm of skin, unspecified: Secondary | ICD-10-CM

## 2022-07-27 DIAGNOSIS — N189 Chronic kidney disease, unspecified: Secondary | ICD-10-CM

## 2022-07-27 DIAGNOSIS — M545 Chronic lower back pain: Secondary | ICD-10-CM

## 2022-07-27 DIAGNOSIS — E039 Hypothyroidism, unspecified: Secondary | ICD-10-CM

## 2022-07-27 DIAGNOSIS — G629 Polyneuropathy, unspecified: Secondary | ICD-10-CM

## 2022-07-27 DIAGNOSIS — W19XXXA Unspecified fall, initial encounter: Secondary | ICD-10-CM

## 2022-07-27 DIAGNOSIS — K2270 Barrett's esophagus without dysplasia: Secondary | ICD-10-CM

## 2022-07-27 DIAGNOSIS — A498 Other bacterial infections of unspecified site: Secondary | ICD-10-CM

## 2022-07-27 DIAGNOSIS — K319 Disease of stomach and duodenum, unspecified: Secondary | ICD-10-CM

## 2022-07-27 DIAGNOSIS — E3120 Multiple endocrine neoplasia [MEN] syndrome, unspecified: Secondary | ICD-10-CM

## 2022-07-27 DIAGNOSIS — M1990 Unspecified osteoarthritis, unspecified site: Secondary | ICD-10-CM

## 2022-07-27 DIAGNOSIS — F419 Anxiety disorder, unspecified: Secondary | ICD-10-CM

## 2022-07-27 DIAGNOSIS — D539 Nutritional anemia, unspecified: Secondary | ICD-10-CM

## 2022-07-27 DIAGNOSIS — E119 Type 2 diabetes mellitus without complications: Secondary | ICD-10-CM

## 2022-07-27 DIAGNOSIS — N400 Enlarged prostate without lower urinary tract symptoms: Secondary | ICD-10-CM

## 2022-07-27 DIAGNOSIS — C73 Malignant neoplasm of thyroid gland: Secondary | ICD-10-CM

## 2022-07-27 DIAGNOSIS — E785 Hyperlipidemia, unspecified: Secondary | ICD-10-CM

## 2022-07-27 DIAGNOSIS — R519 Generalized headaches: Secondary | ICD-10-CM

## 2022-07-27 DIAGNOSIS — Z8719 Personal history of other diseases of the digestive system: Secondary | ICD-10-CM

## 2022-07-27 DIAGNOSIS — B999 Unspecified infectious disease: Secondary | ICD-10-CM

## 2022-07-27 DIAGNOSIS — IMO0002 Squamous cell carcinoma: Secondary | ICD-10-CM

## 2022-07-27 DIAGNOSIS — K3184 Gastroparesis: Secondary | ICD-10-CM

## 2022-07-27 MED ORDER — PROPOFOL 10 MG/ML IV EMUL 20 ML (INFUSION)(AM)(OR)
INTRAVENOUS | 0 refills | Status: DC
Start: 2022-07-27 — End: 2022-07-27
  Administered 2022-07-27: 18:00:00 140 ug/kg/min via INTRAVENOUS

## 2022-07-27 MED ORDER — LIDOCAINE (PF) 20 MG/ML (2 %) IJ SOLN
INTRAVENOUS | 0 refills | Status: DC
Start: 2022-07-27 — End: 2022-07-27

## 2022-07-27 MED ORDER — PROPOFOL INJ 10 MG/ML IV VIAL
INTRAVENOUS | 0 refills | Status: DC
Start: 2022-07-27 — End: 2022-07-27

## 2022-07-27 MED ORDER — FENTANYL CITRATE (PF) 50 MCG/ML IJ SOLN
INTRAVENOUS | 0 refills | Status: DC
Start: 2022-07-27 — End: 2022-07-27

## 2022-07-27 MED ADMIN — LACTATED RINGERS IV SOLP [4318]: 1000.000 mL | INTRAVENOUS | @ 18:00:00 | Stop: 2022-07-29 | NDC 00338011704

## 2022-07-27 MED ADMIN — METOPROLOL TARTRATE 25 MG PO TAB [37637]: 25 mg | ORAL | @ 14:00:00 | NDC 62584026511

## 2022-07-27 MED ADMIN — DULOXETINE 30 MG PO CPDR [93424]: 30 mg | ORAL | @ 14:00:00 | NDC 00904704461

## 2022-07-27 MED ADMIN — OXYCODONE 5 MG PO TAB [10814]: 7.5 mg | ORAL | @ 03:00:00 | NDC 00406055223

## 2022-07-27 MED ADMIN — ATORVASTATIN 20 MG PO TAB [77412]: 20 mg | ORAL | @ 14:00:00 | NDC 00904629161

## 2022-07-27 MED ADMIN — PANTOPRAZOLE 40 MG PO TBEC [80436]: 40 mg | ORAL | @ 14:00:00 | NDC 00904647461

## 2022-07-27 MED ADMIN — SENNOSIDES-DOCUSATE SODIUM 8.6-50 MG PO TAB [40926]: 2 | ORAL | @ 14:00:00 | NDC 00536124810

## 2022-07-27 MED ADMIN — LEVOTHYROXINE 175 MCG PO TAB [10406]: 350 ug | ORAL | @ 11:00:00 | NDC 00904695761

## 2022-07-27 MED ADMIN — OXYCODONE 5 MG PO TAB [10814]: 7.5 mg | ORAL | @ 10:00:00 | NDC 00406055223

## 2022-07-27 MED ADMIN — INSULIN ASPART 100 UNIT/ML SC FLEXPEN [87504]: 1 [IU] | SUBCUTANEOUS | @ 23:00:00 | NDC 00169633910

## 2022-07-27 MED ADMIN — PANTOPRAZOLE 40 MG PO TBEC [80436]: 40 mg | ORAL | @ 03:00:00 | NDC 00904647461

## 2022-07-27 MED ADMIN — ONDANSETRON HCL (PF) 4 MG/2 ML IJ SOLN [136012]: 8 mg | INTRAVENOUS | @ 15:00:00 | NDC 00641607801

## 2022-07-27 MED ADMIN — ACETAMINOPHEN 500 MG PO TAB [102]: 1000 mg | ORAL | @ 14:00:00 | NDC 00904673061

## 2022-07-27 MED ADMIN — METOPROLOL TARTRATE 25 MG PO TAB [37637]: 25 mg | ORAL | @ 03:00:00 | NDC 62584026511

## 2022-07-27 MED ADMIN — SENNOSIDES-DOCUSATE SODIUM 8.6-50 MG PO TAB [40926]: 2 | ORAL | @ 03:00:00 | NDC 00536124810

## 2022-07-27 MED ADMIN — BUPROPION XL 150 MG PO TB24 [88619]: 300 mg | ORAL | @ 14:00:00 | NDC 68180031909

## 2022-07-27 NOTE — Anesthesia Post-Procedure Evaluation
Post-Anesthesia Evaluation    Name: Jimmy Waters      MRN: [id number]     DOB: Jan 31, 1956     Age: 67 y.o.     Sex: male   __________________________________________________________________________     Procedure Information       Anesthesia Start Date/Time: 07/27/22 1240    Procedure: ESOPHAGOGASTRODUODENOSCOPY WITH BIOPSY - FLEXIBLE    Location: ENDO 5 / ENDO/GI    Surgeons: Amanda Cockayne, MD            Post-Anesthesia Vitals  BP: 144/81 (05/03 1321)  Temp: 36.7 ?C (98.1 ?F) (05/03 1303)  Pulse: 89 (05/03 1321)  Respirations: 20 PER MINUTE (05/03 1321)  SpO2: 95 % (05/03 1321)  O2 Device: None (Room air) (05/03 1321)   Vitals Value Taken Time   BP 144/81 07/27/22 1321   Temp 36.7 ?C (98.1 ?F) 07/27/22 1303   Pulse 89 07/27/22 1321   Respirations 20 PER MINUTE 07/27/22 1321   SpO2 95 % 07/27/22 1321   O2 Device None (Room air) 07/27/22 1321   ABP     ART BP           Post Anesthesia Evaluation Note    Evaluation location: Pre/Post  Patient participation: recovered; patient participated in evaluation  Level of consciousness: alert    Pain score: 0  Pain management: adequate    Hydration: normovolemia  Temperature: 36.0?C - 38.4?C  Airway patency: adequate    Perioperative Events       Post-op nausea and vomiting: no PONV    Postoperative Status  Cardiovascular status: hemodynamically stable  Respiratory status: spontaneous ventilation  Follow-up needed: none  Additional comments: Pt seen and examined . Doing well in PACU , VS Stable.         Perioperative Events  There were no known complications for this encounter.

## 2022-07-28 ENCOUNTER — Encounter
Admit: 2022-07-28 | Discharge: 2022-07-28 | Payer: MEDICARE | Primary: Student in an Organized Health Care Education/Training Program

## 2022-07-28 ENCOUNTER — Inpatient Hospital Stay
Admit: 2022-07-28 | Discharge: 2022-07-28 | Payer: MEDICARE | Primary: Student in an Organized Health Care Education/Training Program

## 2022-07-28 MED ADMIN — ACETAMINOPHEN 500 MG PO TAB [102]: 1000 mg | ORAL | @ 14:00:00 | NDC 00904673061

## 2022-07-28 MED ADMIN — ONDANSETRON 4 MG PO TBDI [82394]: 8 mg | ORAL | @ 21:00:00 | NDC 68462015740

## 2022-07-28 MED ADMIN — METOPROLOL TARTRATE 25 MG PO TAB [37637]: 25 mg | ORAL | @ 14:00:00 | NDC 62584026511

## 2022-07-28 MED ADMIN — METOPROLOL TARTRATE 25 MG PO TAB [37637]: 25 mg | ORAL | @ 02:00:00 | NDC 62584026511

## 2022-07-28 MED ADMIN — DULOXETINE 30 MG PO CPDR [93424]: 30 mg | ORAL | @ 14:00:00 | NDC 00904704461

## 2022-07-28 MED ADMIN — PANTOPRAZOLE 40 MG PO TBEC [80436]: 40 mg | ORAL | @ 14:00:00 | NDC 00904647461

## 2022-07-28 MED ADMIN — OXYCODONE 5 MG PO TAB [10814]: 7.5 mg | ORAL | @ 02:00:00 | NDC 00406055223

## 2022-07-28 MED ADMIN — ATORVASTATIN 20 MG PO TAB [77412]: 20 mg | ORAL | @ 14:00:00 | NDC 00904629161

## 2022-07-28 MED ADMIN — OXYCODONE 5 MG PO TAB [10814]: 7.5 mg | ORAL | @ 10:00:00 | NDC 00904696661

## 2022-07-28 MED ADMIN — OXYCODONE 5 MG PO TAB [10814]: 7.5 mg | ORAL | @ 06:00:00 | NDC 00406055223

## 2022-07-28 MED ADMIN — OXYCODONE 5 MG PO TAB [10814]: 7.5 mg | ORAL | @ 14:00:00 | NDC 00406055223

## 2022-07-28 MED ADMIN — PANTOPRAZOLE 40 MG PO TBEC [80436]: 40 mg | ORAL | @ 02:00:00 | NDC 00904647461

## 2022-07-28 MED ADMIN — BUPROPION XL 150 MG PO TB24 [88619]: 300 mg | ORAL | @ 14:00:00 | NDC 68180031909

## 2022-07-28 MED ADMIN — LEVOTHYROXINE 175 MCG PO TAB [10406]: 350 ug | ORAL | @ 11:00:00 | NDC 00904695761

## 2022-07-28 MED ADMIN — OXYCODONE 5 MG PO TAB [10814]: 7.5 mg | ORAL | @ 21:00:00 | NDC 00406055223

## 2022-07-28 MED ADMIN — ONDANSETRON 4 MG PO TBDI [82394]: 8 mg | ORAL | @ 14:00:00 | NDC 68462015740

## 2022-07-29 ENCOUNTER — Encounter
Admit: 2022-07-29 | Discharge: 2022-07-29 | Payer: MEDICARE | Primary: Student in an Organized Health Care Education/Training Program

## 2022-07-29 DIAGNOSIS — N189 Chronic kidney disease, unspecified: Secondary | ICD-10-CM

## 2022-07-29 DIAGNOSIS — R2689 Other abnormalities of gait and mobility: Secondary | ICD-10-CM

## 2022-07-29 DIAGNOSIS — B999 Unspecified infectious disease: Secondary | ICD-10-CM

## 2022-07-29 DIAGNOSIS — M549 Dorsalgia, unspecified: Secondary | ICD-10-CM

## 2022-07-29 DIAGNOSIS — Z8719 Personal history of other diseases of the digestive system: Secondary | ICD-10-CM

## 2022-07-29 DIAGNOSIS — R519 Generalized headaches: Secondary | ICD-10-CM

## 2022-07-29 DIAGNOSIS — N529 Male erectile dysfunction, unspecified: Secondary | ICD-10-CM

## 2022-07-29 DIAGNOSIS — M545 Chronic lower back pain: Secondary | ICD-10-CM

## 2022-07-29 DIAGNOSIS — W19XXXA Unspecified fall, initial encounter: Secondary | ICD-10-CM

## 2022-07-29 DIAGNOSIS — N390 Urinary tract infection, site not specified: Secondary | ICD-10-CM

## 2022-07-29 DIAGNOSIS — N200 Calculus of kidney: Secondary | ICD-10-CM

## 2022-07-29 DIAGNOSIS — K3184 Gastroparesis: Secondary | ICD-10-CM

## 2022-07-29 DIAGNOSIS — N62 Hypertrophy of breast: Secondary | ICD-10-CM

## 2022-07-29 DIAGNOSIS — E041 Nontoxic single thyroid nodule: Secondary | ICD-10-CM

## 2022-07-29 DIAGNOSIS — E039 Hypothyroidism, unspecified: Secondary | ICD-10-CM

## 2022-07-29 DIAGNOSIS — C4490 Unspecified malignant neoplasm of skin, unspecified: Secondary | ICD-10-CM

## 2022-07-29 DIAGNOSIS — K319 Disease of stomach and duodenum, unspecified: Secondary | ICD-10-CM

## 2022-07-29 DIAGNOSIS — C73 Malignant neoplasm of thyroid gland: Secondary | ICD-10-CM

## 2022-07-29 DIAGNOSIS — F419 Anxiety disorder, unspecified: Secondary | ICD-10-CM

## 2022-07-29 DIAGNOSIS — M1990 Unspecified osteoarthritis, unspecified site: Secondary | ICD-10-CM

## 2022-07-29 DIAGNOSIS — N400 Enlarged prostate without lower urinary tract symptoms: Secondary | ICD-10-CM

## 2022-07-29 DIAGNOSIS — K5732 Diverticulitis of large intestine without perforation or abscess without bleeding: Secondary | ICD-10-CM

## 2022-07-29 DIAGNOSIS — IMO0002 Squamous cell carcinoma: Secondary | ICD-10-CM

## 2022-07-29 DIAGNOSIS — K219 Gastro-esophageal reflux disease without esophagitis: Secondary | ICD-10-CM

## 2022-07-29 DIAGNOSIS — E119 Type 2 diabetes mellitus without complications: Secondary | ICD-10-CM

## 2022-07-29 DIAGNOSIS — Z9889 Other specified postprocedural states: Secondary | ICD-10-CM

## 2022-07-29 DIAGNOSIS — D539 Nutritional anemia, unspecified: Secondary | ICD-10-CM

## 2022-07-29 DIAGNOSIS — E785 Hyperlipidemia, unspecified: Secondary | ICD-10-CM

## 2022-07-29 DIAGNOSIS — G629 Polyneuropathy, unspecified: Secondary | ICD-10-CM

## 2022-07-29 DIAGNOSIS — I10 Essential (primary) hypertension: Secondary | ICD-10-CM

## 2022-07-29 DIAGNOSIS — K2270 Barrett's esophagus without dysplasia: Secondary | ICD-10-CM

## 2022-07-29 DIAGNOSIS — E3120 Multiple endocrine neoplasia [MEN] syndrome, unspecified: Secondary | ICD-10-CM

## 2022-07-29 DIAGNOSIS — A498 Other bacterial infections of unspecified site: Secondary | ICD-10-CM

## 2022-07-29 MED ADMIN — METOPROLOL TARTRATE 25 MG PO TAB [37637]: 25 mg | ORAL | @ 14:00:00 | Stop: 2022-07-29 | NDC 62584026511

## 2022-07-29 MED ADMIN — OXYCODONE 5 MG PO TAB [10814]: 7.5 mg | ORAL | @ 03:00:00 | NDC 00406055223

## 2022-07-29 MED ADMIN — LOSARTAN 25 MG PO TAB [80886]: 25 mg | ORAL | @ 14:00:00 | Stop: 2022-07-29 | NDC 00904704761

## 2022-07-29 MED ADMIN — OXYCODONE 5 MG PO TAB [10814]: 7.5 mg | ORAL | @ 08:00:00 | Stop: 2022-07-29 | NDC 00406055223

## 2022-07-29 MED ADMIN — PANTOPRAZOLE 40 MG PO TBEC [80436]: 40 mg | ORAL | @ 14:00:00 | Stop: 2022-07-29 | NDC 00904647461

## 2022-07-29 MED ADMIN — LEVOTHYROXINE 150 MCG PO TAB [4425]: 150 ug | ORAL | @ 11:00:00 | Stop: 2022-07-29 | NDC 00904695661

## 2022-07-29 MED ADMIN — SENNOSIDES-DOCUSATE SODIUM 8.6-50 MG PO TAB [40926]: 2 | ORAL | @ 14:00:00 | Stop: 2022-07-29 | NDC 00536124810

## 2022-07-29 MED ADMIN — BUPROPION XL 150 MG PO TB24 [88619]: 300 mg | ORAL | @ 14:00:00 | Stop: 2022-07-29 | NDC 68180031909

## 2022-07-29 MED ADMIN — DULOXETINE 30 MG PO CPDR [93424]: 30 mg | ORAL | @ 14:00:00 | Stop: 2022-07-29 | NDC 00904704461

## 2022-07-29 MED ADMIN — METOPROLOL TARTRATE 25 MG PO TAB [37637]: 25 mg | ORAL | @ 03:00:00 | NDC 62584026511

## 2022-07-29 MED ADMIN — ATORVASTATIN 20 MG PO TAB [77412]: 20 mg | ORAL | @ 14:00:00 | Stop: 2022-07-29 | NDC 00904629161

## 2022-07-29 MED ADMIN — PANTOPRAZOLE 40 MG PO TBEC [80436]: 40 mg | ORAL | @ 03:00:00 | NDC 00904647461

## 2022-08-03 ENCOUNTER — Encounter
Admit: 2022-08-03 | Discharge: 2022-08-03 | Payer: MEDICARE | Primary: Student in an Organized Health Care Education/Training Program

## 2022-08-10 ENCOUNTER — Encounter
Admit: 2022-08-10 | Discharge: 2022-08-10 | Payer: MEDICARE | Primary: Student in an Organized Health Care Education/Training Program

## 2022-08-17 ENCOUNTER — Encounter
Admit: 2022-08-17 | Discharge: 2022-08-17 | Payer: MEDICARE | Primary: Student in an Organized Health Care Education/Training Program

## 2022-08-17 NOTE — Telephone Encounter
Returned phone call to pt's wife and after confirming pt name/DOB, scheduled ultrasound and follow up for same day on 10/12/23. Pt's wife agreeable to times/locations. Denies further needs/requests.

## 2022-08-17 NOTE — Telephone Encounter
Pt's wife called wanting to resetup appointment they had to cancel for July 19th if possible do to the patient already having an appointment that day. Please give her a call back at [phone]

## 2022-09-24 ENCOUNTER — Encounter
Admit: 2022-09-24 | Discharge: 2022-09-24 | Payer: MEDICARE | Primary: Student in an Organized Health Care Education/Training Program

## 2022-09-28 ENCOUNTER — Encounter
Admit: 2022-09-28 | Discharge: 2022-09-28 | Payer: MEDICARE | Primary: Student in an Organized Health Care Education/Training Program

## 2022-10-12 ENCOUNTER — Ambulatory Visit
Admit: 2022-10-12 | Discharge: 2022-10-12 | Payer: MEDICARE | Primary: Student in an Organized Health Care Education/Training Program

## 2022-10-12 ENCOUNTER — Encounter
Admit: 2022-10-12 | Discharge: 2022-10-12 | Payer: MEDICARE | Primary: Student in an Organized Health Care Education/Training Program

## 2022-10-12 DIAGNOSIS — Z9889 Other specified postprocedural states: Secondary | ICD-10-CM

## 2022-10-12 DIAGNOSIS — E785 Hyperlipidemia, unspecified: Secondary | ICD-10-CM

## 2022-10-12 DIAGNOSIS — K3184 Gastroparesis: Secondary | ICD-10-CM

## 2022-10-12 DIAGNOSIS — M545 Chronic lower back pain: Secondary | ICD-10-CM

## 2022-10-12 DIAGNOSIS — Z8719 Personal history of other diseases of the digestive system: Secondary | ICD-10-CM

## 2022-10-12 DIAGNOSIS — G629 Polyneuropathy, unspecified: Secondary | ICD-10-CM

## 2022-10-12 DIAGNOSIS — K5732 Diverticulitis of large intestine without perforation or abscess without bleeding: Secondary | ICD-10-CM

## 2022-10-12 DIAGNOSIS — M549 Dorsalgia, unspecified: Secondary | ICD-10-CM

## 2022-10-12 DIAGNOSIS — N62 Hypertrophy of breast: Secondary | ICD-10-CM

## 2022-10-12 DIAGNOSIS — C73 Malignant neoplasm of thyroid gland: Secondary | ICD-10-CM

## 2022-10-12 DIAGNOSIS — R519 Generalized headaches: Secondary | ICD-10-CM

## 2022-10-12 DIAGNOSIS — R2689 Other abnormalities of gait and mobility: Secondary | ICD-10-CM

## 2022-10-12 DIAGNOSIS — N200 Calculus of kidney: Secondary | ICD-10-CM

## 2022-10-12 DIAGNOSIS — B999 Unspecified infectious disease: Secondary | ICD-10-CM

## 2022-10-12 DIAGNOSIS — A498 Other bacterial infections of unspecified site: Secondary | ICD-10-CM

## 2022-10-12 DIAGNOSIS — K2270 Barrett's esophagus without dysplasia: Secondary | ICD-10-CM

## 2022-10-12 DIAGNOSIS — D539 Nutritional anemia, unspecified: Secondary | ICD-10-CM

## 2022-10-12 DIAGNOSIS — K319 Disease of stomach and duodenum, unspecified: Secondary | ICD-10-CM

## 2022-10-12 DIAGNOSIS — E041 Nontoxic single thyroid nodule: Secondary | ICD-10-CM

## 2022-10-12 DIAGNOSIS — N529 Male erectile dysfunction, unspecified: Secondary | ICD-10-CM

## 2022-10-12 DIAGNOSIS — IMO0002 Squamous cell carcinoma: Secondary | ICD-10-CM

## 2022-10-12 DIAGNOSIS — C4490 Unspecified malignant neoplasm of skin, unspecified: Secondary | ICD-10-CM

## 2022-10-12 DIAGNOSIS — N189 Chronic kidney disease, unspecified: Secondary | ICD-10-CM

## 2022-10-12 DIAGNOSIS — F419 Anxiety disorder, unspecified: Secondary | ICD-10-CM

## 2022-10-12 DIAGNOSIS — W19XXXA Unspecified fall, initial encounter: Secondary | ICD-10-CM

## 2022-10-12 DIAGNOSIS — M1990 Unspecified osteoarthritis, unspecified site: Secondary | ICD-10-CM

## 2022-10-12 DIAGNOSIS — N390 Urinary tract infection, site not specified: Secondary | ICD-10-CM

## 2022-10-12 DIAGNOSIS — E3120 Multiple endocrine neoplasia [MEN] syndrome, unspecified: Secondary | ICD-10-CM

## 2022-10-12 DIAGNOSIS — I10 Essential (primary) hypertension: Secondary | ICD-10-CM

## 2022-10-12 DIAGNOSIS — N400 Enlarged prostate without lower urinary tract symptoms: Secondary | ICD-10-CM

## 2022-10-12 DIAGNOSIS — E119 Type 2 diabetes mellitus without complications: Secondary | ICD-10-CM

## 2022-10-12 DIAGNOSIS — E039 Hypothyroidism, unspecified: Secondary | ICD-10-CM

## 2022-10-12 DIAGNOSIS — K219 Gastro-esophageal reflux disease without esophagitis: Secondary | ICD-10-CM

## 2022-10-12 LAB — FREE T4-FREE THYROXINE: FREE T4: 1.3 ng/dL (ref 0.6–1.6)

## 2022-10-12 LAB — THYROGLOBULIN & THYROGLOBULIN AB
ANTI THYROGLOB ANTI: 16 [IU]/mL (ref <116)
THYROGLOBULIN: 1 ng/mL — ABNORMAL LOW (ref 1.7–55.0)

## 2022-10-12 LAB — TSH WITH FREE T4 REFLEX

## 2022-10-12 NOTE — Patient Instructions
How to contact Dr. Buczek's/Dr. Bur's nurse, Maylea Soria:  Clinic phone number [phone]  Clinic fax number [phone]  Email- [email or web address]  You may also send messages through MyChart     Head and Neck Endocrine Glands    Thyroid Gland     The thyroid gland is a butterfly shaped endocrine organ that produces and releases Thyroid hormones (T3 & T4) from the lower midline portion of your neck.     Thyroid hormones are responsible for performing a variety of functions, including:    Overall Body Homeostasis Metabolism (Contribute to Weight Gain/Loss)   Heart Rate & Heart Contractability Respiratory Rate    Reproductive Health Body Temperature   Bone Growth and Development Mood     parathyroid glands     The parathyroid glands are endocrine organs that produce and release parathyroid hormone (PTH). There are four parathyroid glands (2 on each side) located on the back surface of your thyroid gland.    Parathyroid hormone is responsible for maintaining the correct amount of calcium in the body.   If one or more gland produces too much PTH = elevated calcium - too little PTH = lower calcium.    Elevated calcium in the bloodstream can lead to memory fog, fatigue, kidney stones, excess urination, osteoporosis and abnormal bone fractures, abdominal pain, nausea/vomiting, constipation, mood fluctuations/depression, body aches/muscle pain.     Low calcium can lead to tingling/numbness around the mouth and finger tips, muscle spasms, heart arrhythmiax, and coma.

## 2022-10-28 ENCOUNTER — Encounter
Admit: 2022-10-28 | Discharge: 2022-10-28 | Payer: MEDICARE | Primary: Student in an Organized Health Care Education/Training Program

## 2022-11-05 ENCOUNTER — Ambulatory Visit
Admit: 2022-11-05 | Discharge: 2022-11-06 | Payer: MEDICARE | Primary: Student in an Organized Health Care Education/Training Program

## 2022-11-05 DIAGNOSIS — E1165 Type 2 diabetes mellitus with hyperglycemia: Secondary | ICD-10-CM

## 2022-11-05 DIAGNOSIS — E890 Postprocedural hypothyroidism: Secondary | ICD-10-CM

## 2022-11-05 DIAGNOSIS — M8000XD Age-related osteoporosis with current pathological fracture, unspecified site, subsequent encounter for fracture with routine healing: Secondary | ICD-10-CM

## 2022-11-05 MED ORDER — DEXCOM G7 SENSOR MISC DEVI
1 | 3 refills | Status: AC
Start: 2022-11-05 — End: ?

## 2022-11-05 MED ORDER — LEVOTHYROXINE 50 MCG PO TAB
50 ug | ORAL_TABLET | Freq: Every day | ORAL | 3 refills | Status: CN
Start: 2022-11-05 — End: ?

## 2022-11-05 MED ORDER — GLUCAGON 1 MG IJ SOLR
INTRAMUSCULAR | 1 refills | 16.00000 days | Status: AC
Start: 2022-11-05 — End: ?

## 2022-11-05 MED ORDER — LEVOTHYROXINE 300 MCG PO TAB
300 ug | ORAL_TABLET | Freq: Every day | ORAL | 3 refills | 30.00000 days | Status: DC
Start: 2022-11-05 — End: 2022-11-08

## 2022-11-05 NOTE — Progress Notes
Continuous Glucose Monitoring Analysis and Interpretation    Indication for Device Placement: Blood sugar values not well controlled as evidenced by high A1C    Name/Type of Device Placed: Dexcom G5/G6    Date of Print out: 11/05/2022    Continuous glucose monitor downloaded and personally reviewed by me on 11/05/2022    Analysis of Data    Date Range: 7/30 -8/12    Sensor usage time (%): 93     Average glucose (mg/dL): 366    Standard Deviation (mg/dL): 53     Time in range (%): 18    Time above range (%): 82   Time above 250mg /dL (%): 37    Time below range (%): 0   Time below 54mg /dL (%): 0    Assessment  Trends/Patterns observed  Hyperglycemia: prandial hyperglycemia   Hypoglycemia: none    Wife mentions one episode of hypoglycemia with BS 40, had to call EMS.   He took [place] and pre meal insulin together at bedtime and did not eat    Plan  Treatment changes: Continue Soliqua 25 units  Prandial insulin: 8 units pre meals. If he drinks Pepsi with his meal, to increase Pre meal insulin to 12 units.   To increase pre meal dose if he has BS above 180   Correction factor insulin: If blood sugar 151-200: Take extra 2 unit.  If blood sugar 201-250: Take extra 4 units.  If blood sugar 251-300: Take extra 6 units.  If blood sugar is 301-350: Take extra 8 units.  If blood sugars > 350: Take extra 10 units.  Review of readings/tracings and any changes to plan of care discussed with patient by phone on 11/05/2022.

## 2022-11-05 NOTE — Patient Instructions
1) pls do labs before next appt in 2 months    2) Cut down Pepsi and change to sugar free    3) change Levothyroxine dose to 300 mcg daily.

## 2022-11-06 DIAGNOSIS — Z794 Long term (current) use of insulin: Secondary | ICD-10-CM

## 2022-11-06 DIAGNOSIS — C73 Malignant neoplasm of thyroid gland: Secondary | ICD-10-CM

## 2022-11-08 ENCOUNTER — Encounter
Admit: 2022-11-08 | Discharge: 2022-11-08 | Payer: MEDICARE | Primary: Student in an Organized Health Care Education/Training Program

## 2022-11-08 DIAGNOSIS — C73 Malignant neoplasm of thyroid gland: Secondary | ICD-10-CM

## 2022-11-08 DIAGNOSIS — E890 Postprocedural hypothyroidism: Secondary | ICD-10-CM

## 2022-11-08 MED ORDER — LEVOTHYROXINE 300 MCG PO TAB
300 ug | ORAL_TABLET | Freq: Every day | ORAL | 3 refills | 30.00000 days | Status: AC
Start: 2022-11-08 — End: ?

## 2022-11-13 ENCOUNTER — Encounter
Admit: 2022-11-13 | Discharge: 2022-11-13 | Payer: MEDICARE | Primary: Student in an Organized Health Care Education/Training Program

## 2022-11-13 MED ORDER — LEVOTHYROXINE 50 MCG PO TAB
50 ug | ORAL_TABLET | Freq: Every day | ORAL | 0 refills
Start: 2022-11-13 — End: ?

## 2022-11-13 NOTE — Telephone Encounter
levothyroxine (SYNTHROID) 50 mcg tablet     Last office visit 11/05/2022 next office visit 01/15/2023  Last refill 07/29/2022 by outside provider for unknown amount  Last TSH 10/12/2022 - <0.01  Last FT4  10/12/2022 - 1.3    Routing to Provider

## 2022-11-16 ENCOUNTER — Encounter
Admit: 2022-11-16 | Discharge: 2022-11-16 | Payer: MEDICARE | Primary: Student in an Organized Health Care Education/Training Program

## 2022-11-16 DIAGNOSIS — E1165 Type 2 diabetes mellitus with hyperglycemia: Secondary | ICD-10-CM

## 2022-11-16 MED ORDER — HUMALOG KWIKPEN INSULIN 100 UNIT/ML SC INPN
3 refills | 39.50000 days | Status: AC
Start: 2022-11-16 — End: ?

## 2022-11-20 ENCOUNTER — Encounter
Admit: 2022-11-20 | Discharge: 2022-11-20 | Payer: MEDICARE | Primary: Student in an Organized Health Care Education/Training Program

## 2023-01-09 ENCOUNTER — Encounter
Admit: 2023-01-09 | Discharge: 2023-01-09 | Payer: MEDICARE | Primary: Student in an Organized Health Care Education/Training Program

## 2023-01-09 DIAGNOSIS — E1165 Type 2 diabetes mellitus with hyperglycemia: Secondary | ICD-10-CM

## 2023-01-09 NOTE — Telephone Encounter
Incoming fax from [employer] supplier containing order form for Dexcom CGM supplies. Provider signature requested, LOV notes, and current Hgb A1C.     Once documents are signed, [employer] has requested the forms faxed to [phone].    Documents placed in Dr. Myrtis Ser folder for review and signature.

## 2023-01-10 ENCOUNTER — Ambulatory Visit
Admit: 2023-01-10 | Discharge: 2023-01-10 | Payer: MEDICARE | Primary: Student in an Organized Health Care Education/Training Program

## 2023-01-10 DIAGNOSIS — M8000XD Age-related osteoporosis with current pathological fracture, unspecified site, subsequent encounter for fracture with routine healing: Secondary | ICD-10-CM

## 2023-01-10 DIAGNOSIS — E890 Postprocedural hypothyroidism: Secondary | ICD-10-CM

## 2023-01-10 DIAGNOSIS — C73 Malignant neoplasm of thyroid gland: Secondary | ICD-10-CM

## 2023-01-10 DIAGNOSIS — E1165 Type 2 diabetes mellitus with hyperglycemia: Secondary | ICD-10-CM

## 2023-01-10 LAB — 25-OH VITAMIN D (D2 + D3): VITAMIN D (25-OH) TOTAL: 33 ng/mL (ref 30–80)

## 2023-01-10 LAB — FREE T4-FREE THYROXINE: FREE T4: 1.9 ng/dL — ABNORMAL HIGH (ref 0.6–1.6)

## 2023-01-10 LAB — PARATHYROID HORMONE: PTH HORMONE: 28 pg/mL (ref 10–65)

## 2023-01-10 LAB — TSH WITH FREE T4 REFLEX

## 2023-01-10 LAB — THYROGLOBULIN & THYROGLOBULIN AB
ANTI THYROGLOB ANTI: 13 [IU]/mL (ref <116)
THYROGLOBULIN: 1.4 ng/mL — ABNORMAL LOW (ref 1.7–55.0)

## 2023-01-15 ENCOUNTER — Encounter
Admit: 2023-01-15 | Discharge: 2023-01-15 | Payer: MEDICARE | Primary: Student in an Organized Health Care Education/Training Program

## 2023-01-15 ENCOUNTER — Ambulatory Visit
Admit: 2023-01-15 | Discharge: 2023-01-16 | Payer: MEDICARE | Primary: Student in an Organized Health Care Education/Training Program

## 2023-01-15 DIAGNOSIS — IMO0002 Squamous cell carcinoma: Secondary | ICD-10-CM

## 2023-01-15 DIAGNOSIS — G629 Polyneuropathy, unspecified: Secondary | ICD-10-CM

## 2023-01-15 DIAGNOSIS — N200 Calculus of kidney: Secondary | ICD-10-CM

## 2023-01-15 DIAGNOSIS — K5732 Diverticulitis of large intestine without perforation or abscess without bleeding: Secondary | ICD-10-CM

## 2023-01-15 DIAGNOSIS — K3184 Gastroparesis: Secondary | ICD-10-CM

## 2023-01-15 DIAGNOSIS — M545 Chronic lower back pain: Secondary | ICD-10-CM

## 2023-01-15 DIAGNOSIS — N400 Enlarged prostate without lower urinary tract symptoms: Secondary | ICD-10-CM

## 2023-01-15 DIAGNOSIS — Z8719 Personal history of other diseases of the digestive system: Secondary | ICD-10-CM

## 2023-01-15 DIAGNOSIS — R519 Generalized headaches: Secondary | ICD-10-CM

## 2023-01-15 DIAGNOSIS — E785 Hyperlipidemia, unspecified: Secondary | ICD-10-CM

## 2023-01-15 DIAGNOSIS — A498 Other bacterial infections of unspecified site: Secondary | ICD-10-CM

## 2023-01-15 DIAGNOSIS — K319 Disease of stomach and duodenum, unspecified: Secondary | ICD-10-CM

## 2023-01-15 DIAGNOSIS — N62 Hypertrophy of breast: Secondary | ICD-10-CM

## 2023-01-15 DIAGNOSIS — N390 Urinary tract infection, site not specified: Secondary | ICD-10-CM

## 2023-01-15 DIAGNOSIS — B999 Unspecified infectious disease: Secondary | ICD-10-CM

## 2023-01-15 DIAGNOSIS — K2270 Barrett's esophagus without dysplasia: Secondary | ICD-10-CM

## 2023-01-15 DIAGNOSIS — D539 Nutritional anemia, unspecified: Secondary | ICD-10-CM

## 2023-01-15 DIAGNOSIS — N529 Male erectile dysfunction, unspecified: Secondary | ICD-10-CM

## 2023-01-15 DIAGNOSIS — E119 Type 2 diabetes mellitus without complications: Secondary | ICD-10-CM

## 2023-01-15 DIAGNOSIS — C4490 Unspecified malignant neoplasm of skin, unspecified: Secondary | ICD-10-CM

## 2023-01-15 DIAGNOSIS — M1990 Unspecified osteoarthritis, unspecified site: Secondary | ICD-10-CM

## 2023-01-15 DIAGNOSIS — C73 Malignant neoplasm of thyroid gland: Secondary | ICD-10-CM

## 2023-01-15 DIAGNOSIS — R296 Repeated falls: Secondary | ICD-10-CM

## 2023-01-15 DIAGNOSIS — E3120 Multiple endocrine neoplasia [MEN] syndrome, unspecified: Secondary | ICD-10-CM

## 2023-01-15 DIAGNOSIS — R2689 Other abnormalities of gait and mobility: Secondary | ICD-10-CM

## 2023-01-15 DIAGNOSIS — E041 Nontoxic single thyroid nodule: Secondary | ICD-10-CM

## 2023-01-15 DIAGNOSIS — K219 Gastro-esophageal reflux disease without esophagitis: Secondary | ICD-10-CM

## 2023-01-15 DIAGNOSIS — N189 Chronic kidney disease, unspecified: Secondary | ICD-10-CM

## 2023-01-15 DIAGNOSIS — E039 Hypothyroidism, unspecified: Secondary | ICD-10-CM

## 2023-01-15 DIAGNOSIS — I10 Essential (primary) hypertension: Secondary | ICD-10-CM

## 2023-01-15 DIAGNOSIS — F419 Anxiety disorder, unspecified: Secondary | ICD-10-CM

## 2023-01-15 DIAGNOSIS — M549 Dorsalgia, unspecified: Secondary | ICD-10-CM

## 2023-01-15 DIAGNOSIS — E890 Postprocedural hypothyroidism: Secondary | ICD-10-CM

## 2023-01-15 DIAGNOSIS — Z9889 Other specified postprocedural states: Secondary | ICD-10-CM

## 2023-01-15 MED ORDER — LEVOTHYROXINE 75 MCG PO TAB
75 ug | ORAL_TABLET | Freq: Every day | ORAL | 3 refills | 30.00000 days | Status: AC
Start: 2023-01-15 — End: ?

## 2023-01-15 MED ORDER — SOLIQUA 100/33 100 UNIT-33 MCG/ML SC INPN
25 [IU] | Freq: Every evening | SUBCUTANEOUS | 0 refills | Status: AC
Start: 2023-01-15 — End: ?

## 2023-01-15 MED ORDER — ONETOUCH ULTRA TEST MISC STRP
1 | ORAL_STRIP | Freq: Before meals | 1 refills | 90.00000 days | Status: AC
Start: 2023-01-15 — End: ?

## 2023-01-15 MED ORDER — LEVOTHYROXINE 200 MCG PO TAB
200 ug | ORAL_TABLET | Freq: Every day | ORAL | 1 refills | 30.00000 days | Status: AC
Start: 2023-01-15 — End: ?

## 2023-01-15 MED ORDER — ONETOUCH ULTRA2 METER MISC MISC
1 | Freq: Three times a day (TID) | 0 refills | 90.00000 days | Status: AC
Start: 2023-01-15 — End: ?

## 2023-01-15 NOTE — Progress Notes
CGM note    Patient's Personal continuous glucose monitor Dexcom download reviewed.  At least 72 hours of data reviewed and interpreted.  Data interpreted 01/15/2023  Data interpreted from October 8 to January 14, 2023    Findings:   Average glucose 249  Standard deviation 64  16% glucose in range  36% high  48% very high  0% low  0% very low    Interpretation postprandial hyperglycemia.    Continuous glucose monitor report scanned in epic.    Electronically signed by Lucrezia Starch, MD 01/15/2023

## 2023-01-15 NOTE — Progress Notes
Chief Complaint   Patient presents with    Diabetes        Date of Service: 01/15/2023    Note to patient: The 21st Century Cures Act makes medical notes like these available to patients in the interest of transparency. However, be advised this is a medical document. It is intended as peer to peer communication. It is written in medical language and may contain abbreviations or verbiage that are unfamiliar. It may appear blunt or direct. Medical documents are intended to carry relevant information, facts as evident, and the clinical opinion of the practitioner.    Jimmy Waters is a 67 y.o. male. DOB: 03/03/1956   MRN#: [id number]    HPI:  Seen today for thyroid cancer.  Reviewed oncology notes.  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  Jan 2018 - RAI 154 mCi Post treatment scan showed some uptake on thyroid bed and mediastinal nodes.   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 She removed a nodule near his thyroidectomy scar.  Pathology confirmed metastatic Papillary Thyroid Cancer.  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  07/13/2020 CT Neck w/contrast [facility]  1. No CT evidence of a suspicious neck mass or suspicious lymphadenopathy by CT size criteria.  2. Symmetric prominence of bilateral false and true vocal cores at the level of the hyoid bone and correlate with clinical exam.     07/27/21 Revision Right Central Neck Dissection.  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  11/2021: 150 mCi RAI treatment.   Low-level uptake in a left supraclavicular lymph node which could represent nodal metastasis.   01/2022 [place]  Left: *  Just posterior to the medial left clavicle, there is a subtle, ovoid lymph node with prominent fatty hilum which demonstrates a short axis diameter of 6 mm (best appreciated on cine images). This likely   corresponds to an area of low-grade radiotracer uptake on the SPECT-CT 12/20/2021.     He is on levothyroxine 300 mg micrograms daily.  He has been compliant with his Ltx.   Reviewed labs     Type 2 DM for many years.  Glucose readings are higher.  Current treatment:  Soliqua 25 units , Pre meal Novolog 8 units.     He has reduced the Pepsi intake.    He was on Trulicity in the past but it was stopped due to thyroid cancer diagnosis.  Trulicity was expensive.  He was on Jardiance but it was stopped when he developed bladder issues . 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 He has h/o recurrent UTIs.     Osteoporosis W FRAGILITY FRACTURE.   Received Reclast 05/25/2022. No side effects     07/2021 Mid femoral diaphyseal fracture with progressive incomplete fracture healing > fall from standing position. Low fragility.  Bone density normal 11/2021  Drinks glass of milk at night.      Review of Systems   Constitutional:  Negative for fever.   Cardiovascular:  Negative for chest pain.         Objective:      acetaminophen (TYLENOL EXTRA STRENGTH) 500 mg tablet Take two tablets by  mouth daily as needed. Max of 4,000 mg of acetaminophen in 24 hours.    ascorbic acid (vitamin C) (VITAMIN C) 1,000 mg tablet Take one tablet by mouth at bedtime daily.    atorvastatin (LIPITOR) 20 mg tablet Take one tablet by mouth daily.    betamethasone dipropionate (BETANATE) 0.05 % topical cream Apply  topically to affected area twice weekly.    blood-glucose meter,continuous (DEXCOM G6 RECEIVER MISC) Use as directed    blood-glucose transmitter (DEXCOM G6 TRANSMITTER MISC) Use as directed    buPROPion HCL SR (WELLBUTRIN SR) 150 mg tablet, 12 hr sustained-release Take one tablet by mouth twice daily.    CHOLEcalciferoL (vitamin D3) (DIALYVITE VITAMIN D) 125 mcg (5,000 unit) capsule Take one capsule by mouth at bedtime daily.    Cranberry 500 mg cap Take one capsule by mouth daily.    d-mannose 500 mg cap Take 1,500 mg by mouth daily. Indications: Supplement    DEXCOM G7 SENSOR sensor device Use one each as directed every 10 days. Indications: type 2 diabetes mellitus    diclofenac sodium (VOLTAREN) 1 % topical gel Apply  topically to affected area daily as needed.    duloxetine DR (CYMBALTA) 30 mg capsule Take one capsule by mouth daily.    ferrous sulfate, dried (IRON) 159 mg (45 mg iron) TbER Take one tablet by mouth at bedtime daily.    gabapentin (NEURONTIN) 600 mg tablet Take two tablets by mouth twice daily.    glucagon 1 mg/1 mL injection solution Inject 1 mg into the upper arm, thigh, or buttocks if needed for severe hypoglycemia and then call 911.    HUMALOG KWIKPEN INSULIN 100 unit/mL subcutaneous PEN Increase pre meal to 12 units ( if drinking pepsi) Take only 8 units if eating a small meal. Humalog or Novolog: If blood sugar 151-200: Take extra 2 unit. If blood sugar 201-250: Take extra 4 units. If blood sugar 251-300: Take extra 6 units. If blood sugar is 301-350: Take extra 8 units. If blood sugars > 350: Take extra 10 units. Max of 75 units per day  Indications: type 2 diabetes mellitus    insulin glargine-lixisenatide (SOLIQUA 100/33) 100 unit-33 mcg/mL syringe Inject twenty five Units under the skin at bedtime daily. May titrate back up to previous dose of 25 units once daily as you begin eating normally.  Indications: type 2 diabetes mellitus    ketoconazole (NIZORAL) 2 % topical cream Apply  topically to affected area daily as needed.    ketoconazole (NIZORAL) 2 % topical shampoo Apply  topically to affected area once. Apply topically to affected area of damp skin, lather, leave on 5 minutes, and rinse. Repeat two-three times weekly    levothyroxine (SYNTHROID) 200 mcg tablet Take one tablet by mouth daily 30 minutes before breakfast. 300 mcg daily.  Indications: a condition with low thyroid hormone levels    levothyroxine (SYNTHROID) 75 mcg tablet Take one tablet by mouth daily 30 minutes before breakfast.    losartan (COZAAR) 25 mg tablet Take one tablet by mouth daily. Hold until follow up  Indications: high blood pressure (Patient taking differently: Take one tablet by mouth daily. Indications: high blood pressure)    melatonin 10 mg tablet Take one-half tablet by mouth at bedtime daily. Indications: difficulty sleeping    metoprolol tartrate (LOPRESSOR) 50 mg tablet Take one tablet by mouth twice daily.    Miscellaneous Medical Supply misc Urostomy supplies and accessories  Dispense twenty of each for one month of  supplies  Dx Bilateral hydronephrosis N13.30  Indications: Urostomy    MULTIVITAMIN PO Take 1 tablet by mouth at bedtime daily.    ondansetron (ZOFRAN ODT) 8 mg rapid dissolve tablet Dissolve one tablet by mouth every 8 hours as needed.    ONETOUCH ULTRA TEST test strip Use one strip as directed before meals and at bedtime. Indications: type 2 diabetes mellitus    ONETOUCH ULTRA2 METER kit Use one strip as directed three times daily before meals.    oxyCODONE (ROXICODONE) 5 mg tablet Take one tablet to two tablets by mouth every 6 hours as needed. Indications: pain    pantoprazole DR (PROTONIX) 40 mg tablet Take one tablet by mouth twice daily.    polyethylene glycol 3350 (MIRALAX) 17 gram/dose powder Take seventeen g by mouth daily. Indications: constipation    sennosides-docusate sodium (SENOKOT-S) 8.6/50 mg tablet Take two tablets by mouth twice daily. Indications: constipation    triamcinolone acetonide (KENALOG) 0.025 % topical cream Apply  topically to affected area daily as needed.    zinc sulfate 220 mg (50 mg elemental zinc) capsule Take one capsule by mouth at bedtime daily.     There were no vitals filed for this visit.    There is no height or weight on file to calculate BMI.     Physical Exam  Vitals and nursing note reviewed.   Constitutional:       General: He is not in acute distress.     Appearance: Normal appearance. He is not ill-appearing, toxic-appearing or diaphoretic.   HENT:      Head: Normocephalic and atraumatic.      Nose: Nose normal.   Eyes:      Conjunctiva/sclera: Conjunctivae normal.   Neurological:      Mental Status: He is alert and oriented to person, place, and time.   Psychiatric:         Mood and Affect: Mood normal.               Lab    Latest Reference Range & Units 10/12/22 15:47   T4-Free 0.6 - 1.6 NG/DL 1.3   TSH 4.54 - 0.98 MCU/ML <0.01 (L)   Thyroglobulin Ab <116 IU/mL 16   Thyroglobulin 1.7 - 55.0 NG/ML 1.0 (L)     US Neck 09/2022 : Changes of total thyroidectomy. No evidence of local recurrence.   2. No suspicious anterior cervical lymph nodes. The previously described   small lymph node with a prominent fatty hilum just posterior to the medial   left clavicle is not seen today.       Assessment and Plan:  Jimmy Waters was seen today for diabetes.    Diagnoses and all orders for this visit:    Uncontrolled type 2 diabetes mellitus with hyperglycemia, with long-term current use of insulin (HCC)  -     CONTINUOUS BLOOD GLUCOSE MONITOR  -     ONETOUCH ULTRA2 METER kit; Use one strip as directed three times daily before meals.  -     ONETOUCH ULTRA TEST test strip; Use one strip as directed before meals and at bedtime. Indications: type 2 diabetes mellitus    Postoperative hypothyroidism  -     levothyroxine (SYNTHROID) 200 mcg tablet; Take one tablet by mouth daily 30 minutes before breakfast. 300 mcg daily.  Indications: a condition with low thyroid hormone levels    Thyroid cancer (HCC)  -     levothyroxine (SYNTHROID) 200 mcg tablet; Take  one tablet by mouth daily 30 minutes before breakfast. 300 mcg daily.  Indications: a condition with low thyroid hormone levels    Other orders  -     levothyroxine (SYNTHROID) 75 mcg tablet; Take one tablet by mouth daily 30 minutes before breakfast.  -     insulin glargine-lixisenatide (SOLIQUA 100/33) 100 unit-33 mcg/mL syringe; Inject twenty five Units under the skin at bedtime daily. May titrate back up to previous dose of 25 units once daily as you begin eating normally.  Indications: type 2 diabetes mellitus        Jimmy Waters was seen today for diabetes.    Uncontrolled Type 2 diabetes mellitus with hyperglycemia, with long-term current use of insulin (HCC)  Diet is better.    Continue Soliqua 25 units.   Increase pre meal to 16 units ( if drinking pepsi)  Take only 8 units if eating a small meal.   Humalog or Novolog:  If blood sugar 151-200: Take extra 2 unit.  If blood sugar 201-250: Take extra 4 units.  If blood sugar 251-300: Take extra 6 units.  If blood sugar is 301-350: Take extra 8 units.  If blood sugars > 350: Take extra 10 units.    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  Goal TSH to be suppressed.    Thyroglobulin detectable, suggestive of biochemical disease but US neck negative for mass/ LN.   TSH suppressed to 0.01, we will reduce dose of Ltx to 275 mcg daily.   Recheck labs in 2 months     Age-related osteoporosis with current pathological fracture with routine healing  07/2021 Mid femoral diaphyseal fracture with progressive incomplete fracture healing > fall from standing position. Low fragility. ---> non healing, F/up w Ortho   Bone density normal.    Received one dose Reclast 05/2022 [facility] hospital )  No interval falls or fractures.     Next dose in March 2025.         Electronically signed by Lucrezia Starch, MD 01/15/2023

## 2023-01-16 DIAGNOSIS — Z794 Long term (current) use of insulin: Secondary | ICD-10-CM

## 2023-01-16 DIAGNOSIS — E1165 Type 2 diabetes mellitus with hyperglycemia: Secondary | ICD-10-CM

## 2023-01-17 ENCOUNTER — Encounter
Admit: 2023-01-17 | Discharge: 2023-01-17 | Payer: MEDICARE | Primary: Student in an Organized Health Care Education/Training Program

## 2023-01-17 DIAGNOSIS — E1165 Type 2 diabetes mellitus with hyperglycemia: Secondary | ICD-10-CM

## 2023-01-17 MED ORDER — ONETOUCH ULTRA TEST MISC STRP
ORAL_STRIP | ORAL | 1 refills | 90.00000 days | Status: AC
Start: 2023-01-17 — End: ?

## 2023-02-11 ENCOUNTER — Encounter
Admit: 2023-02-11 | Discharge: 2023-02-11 | Payer: MEDICARE | Primary: Student in an Organized Health Care Education/Training Program

## 2023-04-09 ENCOUNTER — Encounter
Admit: 2023-04-09 | Discharge: 2023-04-09 | Payer: MEDICARE | Primary: Student in an Organized Health Care Education/Training Program

## 2023-04-11 ENCOUNTER — Ambulatory Visit
Admit: 2023-04-11 | Discharge: 2023-04-11 | Payer: MEDICARE | Primary: Student in an Organized Health Care Education/Training Program

## 2023-04-11 ENCOUNTER — Encounter
Admit: 2023-04-11 | Discharge: 2023-04-11 | Payer: MEDICARE | Primary: Student in an Organized Health Care Education/Training Program

## 2023-04-11 ENCOUNTER — Ambulatory Visit
Admit: 2023-04-11 | Discharge: 2023-04-12 | Payer: MEDICARE | Primary: Student in an Organized Health Care Education/Training Program

## 2023-04-12 ENCOUNTER — Ambulatory Visit
Admit: 2023-04-12 | Discharge: 2023-04-12 | Payer: MEDICARE | Primary: Student in an Organized Health Care Education/Training Program

## 2023-04-12 ENCOUNTER — Encounter
Admit: 2023-04-12 | Discharge: 2023-04-12 | Payer: MEDICARE | Primary: Student in an Organized Health Care Education/Training Program

## 2023-04-19 ENCOUNTER — Encounter
Admit: 2023-04-19 | Discharge: 2023-04-19 | Payer: MEDICARE | Primary: Student in an Organized Health Care Education/Training Program

## 2023-04-19 DIAGNOSIS — N179 Acute kidney failure, unspecified: Secondary | ICD-10-CM

## 2023-04-19 DIAGNOSIS — Z9889 Other specified postprocedural states: Secondary | ICD-10-CM

## 2023-04-20 NOTE — Telephone Encounter
I called patient's wife and discussed the results of his recent BMP which shows a mild AKI.  He is asymptomatic at this time.  I recommend proceeding with repeat labs and they will get this drawn at their upcoming appointment on 05/09/2023.  If his creatinine remains elevated, we will proceed with imaging with an ultrasound or CT scan to evaluate for hydronephrosis.

## 2023-05-02 ENCOUNTER — Encounter
Admit: 2023-05-02 | Discharge: 2023-05-02 | Payer: MEDICARE | Primary: Student in an Organized Health Care Education/Training Program

## 2023-05-07 ENCOUNTER — Encounter
Admit: 2023-05-07 | Discharge: 2023-05-07 | Payer: MEDICARE | Primary: Student in an Organized Health Care Education/Training Program

## 2023-05-07 DIAGNOSIS — E1165 Type 2 diabetes mellitus with hyperglycemia: Secondary | ICD-10-CM

## 2023-05-09 ENCOUNTER — Ambulatory Visit
Admit: 2023-05-09 | Discharge: 2023-05-09 | Payer: MEDICARE | Primary: Student in an Organized Health Care Education/Training Program

## 2023-05-09 ENCOUNTER — Encounter
Admit: 2023-05-09 | Discharge: 2023-05-09 | Payer: MEDICARE | Primary: Student in an Organized Health Care Education/Training Program

## 2023-05-09 DIAGNOSIS — E1165 Type 2 diabetes mellitus with hyperglycemia: Secondary | ICD-10-CM

## 2023-05-09 DIAGNOSIS — M8000XD Age-related osteoporosis with current pathological fracture, unspecified site, subsequent encounter for fracture with routine healing: Secondary | ICD-10-CM

## 2023-05-09 MED ORDER — HUMALOG KWIKPEN INSULIN 100 UNIT/ML SC INPN
0 refills | 39.50000 days | Status: AC
Start: 2023-05-09 — End: ?

## 2023-05-09 NOTE — Progress Notes
Chief Complaint   Patient presents with    Follow Up    Diabetes        Date of Service: 05/09/2023    Note to patient: The 21st Century Cures Act makes medical notes like these available to patients in the interest of transparency. However, be advised this is a medical document. It is intended as peer to peer communication. It is written in medical language and may contain abbreviations or verbiage that are unfamiliar. It may appear blunt or direct. Medical documents are intended to carry relevant information, facts as evident, and the clinical opinion of the practitioner.    Jimmy Waters is a 68 y.o. male. DOB: [birth year]   MRN#: [id number]    HPI:  Seen today for thyroid cancer.  Reviewed oncology notes.  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  Jan 2018 - RAI 154 mCi Post treatment scan showed some uptake on thyroid bed and mediastinal nodes.   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 She removed a nodule near his thyroidectomy scar.  Pathology confirmed metastatic Papillary Thyroid Cancer.  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  07/13/2020 CT Neck w/contrast [facility]  1. No CT evidence of a suspicious neck mass or suspicious lymphadenopathy by CT size criteria.  2. Symmetric prominence of bilateral false and true vocal cores at the level of the hyoid bone and correlate with clinical exam.     07/27/21 Revision Right Central Neck Dissection.  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  11/2021: 150 mCi RAI treatment.   Low-level uptake in a left supraclavicular lymph node which could represent nodal metastasis. 01/2022 [place]  Left: *  Just posterior to the medial left clavicle, there is a subtle, ovoid lymph node with prominent fatty hilum which demonstrates a short axis diameter of 6 mm (best appreciated on cine images). This likely   corresponds to an area of low-grade radiotracer uptake on the SPECT-CT 12/20/2021.     He is on levothyroxine 300 mg micrograms daily.  He has been compliant with his Ltx.   Reviewed labs     Type 2 DM for many years.  Glucose readings are higher.  Current treatment:  Soliqua 25 units , Pre meal Novolog 16 units.     He has reduced the Pepsi intake.    He was on Trulicity in the past but it was stopped due to thyroid cancer diagnosis.  Trulicity was expensive.  He was on Jardiance but it was stopped when he developed bladder issues . 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 He has h/o recurrent UTIs.     Osteoporosis W FRAGILITY FRACTURE.   Received Reclast 05/25/2022. No side effects     07/2021 Mid femoral diaphyseal fracture with progressive incomplete fracture healing > fall from standing position. Low fragility.  Bone density normal 11/2021  Drinks glass of milk at night.      Review of Systems   Constitutional:  Negative for fever.   Cardiovascular:  Negative for chest pain.         Objective:      acetaminophen (TYLENOL EXTRA STRENGTH) 500 mg tablet Take  two tablets by mouth daily as needed. Max of 4,000 mg of acetaminophen in 24 hours.    ascorbic acid (vitamin C) (VITAMIN C) 1,000 mg tablet Take one tablet by mouth at bedtime daily.    atorvastatin (LIPITOR) 20 mg tablet Take one tablet by mouth daily.    betamethasone dipropionate (BETANATE) 0.05 % topical cream Apply  topically to affected area twice weekly.    buPROPion HCL SR (WELLBUTRIN SR) 150 mg tablet, 12 hr sustained-release Take one tablet by mouth twice daily.    CHOLEcalciferoL (vitamin D3) (DIALYVITE VITAMIN D) 125 mcg (5,000 unit) capsule Take one capsule by mouth at bedtime daily.    DEXCOM G7 SENSOR sensor device Use one each as directed every 10 days. Indications: type 2 diabetes mellitus    diclofenac sodium (VOLTAREN) 1 % topical gel Apply  topically to affected area daily as needed.    ferrous sulfate, dried (IRON) 159 mg (45 mg iron) TbER Take one tablet by mouth at bedtime daily.    gabapentin (NEURONTIN) 600 mg tablet Take two tablets by mouth twice daily.    HUMALOG KWIKPEN INSULIN 100 unit/mL subcutaneous PEN 20 units ( if drinking pepsi) with meals. Take only 10 units if eating a small meal.  Indications: type 2 diabetes mellitus    insulin glargine-lixisenatide (SOLIQUA 100/33) 100 unit-33 mcg/mL syringe Inject twenty five Units under the skin at bedtime daily. May titrate back up to previous dose of 25 units once daily as you begin eating normally.  Indications: type 2 diabetes mellitus    ketoconazole (NIZORAL) 2 % topical cream Apply  topically to affected area daily as needed.    ketoconazole (NIZORAL) 2 % topical shampoo Apply  topically to affected area once. Apply topically to affected area of damp skin, lather, leave on 5 minutes, and rinse. Repeat two-three times weekly    levothyroxine (SYNTHROID) 200 mcg tablet Take one tablet by mouth daily 30 minutes before breakfast. 300 mcg daily.  Indications: a condition with low thyroid hormone levels    levothyroxine (SYNTHROID) 75 mcg tablet Take one tablet by mouth daily 30 minutes before breakfast.    losartan (COZAAR) 25 mg tablet Take one tablet by mouth daily. Hold until follow up  Indications: high blood pressure    melatonin 10 mg tablet Take one-half tablet by mouth at bedtime daily. Indications: difficulty sleeping    metoprolol tartrate (LOPRESSOR) 50 mg tablet Take one tablet by mouth twice daily.    Miscellaneous Medical Supply misc Urostomy supplies and accessories  Dispense twenty of each for one month of supplies  Dx Bilateral hydronephrosis N13.30  Indications: Urostomy    MULTIVITAMIN PO Take 1 tablet by mouth at bedtime daily.    ondansetron (ZOFRAN ODT) 8 mg rapid dissolve tablet Dissolve one tablet by mouth every 8 hours as needed.    ONETOUCH ULTRA TEST test strip Use one strip three times daily to check blood glucose. Max daily: 3  Indications: type 2 diabetes mellitus    ONETOUCH ULTRA2 METER kit Use one strip as directed three times daily before meals.    pantoprazole DR (PROTONIX) 40 mg tablet Take one tablet by mouth twice daily.    polyethylene glycol 3350 (MIRALAX) 17 gram/dose powder Take seventeen g by mouth daily. Indications: constipation    sennosides-docusate sodium (SENOKOT-S) 8.6/50 mg tablet Take two tablets by mouth twice daily. Indications: constipation    triamcinolone acetonide (KENALOG) 0.025 % topical cream Apply  topically to affected area daily as needed.  zinc sulfate 220 mg (50 mg elemental zinc) capsule Take one capsule by mouth at bedtime daily.     Vitals:    05/09/23 1015 05/09/23 1017   BP: (!) 152/81 (!) 141/78   BP Source: Arm, Left Upper Arm, Right Upper   Pulse: 79 80   Temp: 36.6 ?C (97.9 ?F)    Resp: 20    SpO2: 100%    TempSrc: Temporal    PainSc: Zero    Weight: 115.2 kg (254 lb)    Height: 185.4 cm (6' 1)        Body mass index is 33.51 kg/m?Marland Kitchen     Physical Exam  Vitals and nursing note reviewed.   Constitutional:       General: He is not in acute distress.     Appearance: Normal appearance. He is not ill-appearing, toxic-appearing or diaphoretic.   HENT:      Head: Normocephalic and atraumatic.      Nose: Nose normal.   Eyes:      Conjunctiva/sclera: Conjunctivae normal.   Neurological:      Mental Status: He is alert and oriented to person, place, and time.   Psychiatric:         Mood and Affect: Mood normal.               Lab    Latest Reference Range & Units 10/12/22 15:47   T4-Free 0.6 - 1.6 NG/DL 1.3   TSH 0.86 - 5.78 MCU/ML <0.01 (L)   Thyroglobulin Ab <116 IU/mL 16   Thyroglobulin 1.7 - 55.0 NG/ML 1.0 (L)     US Neck 09/2022 : Changes of total thyroidectomy. No evidence of local recurrence.   2. No suspicious anterior cervical lymph nodes. The previously described   small lymph node with a prominent fatty hilum just posterior to the medial   left clavicle is not seen today.       Assessment and Plan:  Jimmy Waters Jimmy Waters was seen today for follow up and diabetes.    Diagnoses and all orders for this visit:    Uncontrolled type 2 diabetes mellitus with hyperglycemia, with long-term current use of insulin (HCC)  -     BASIC METABOLIC PANEL; Future; Expected date: 05/09/2023    Hypothyroidism due to acquired atrophy of thyroid  -     TSH WITH FREE T4 REFLEX; Future; Expected date: 05/09/2023    Age-related osteoporosis with current pathological fracture with routine healing  -     BONE DENSITY SPINE/HIP; Future; Expected date: 05/09/2023    Thyroid cancer (HCC)  -     THYROGLOBULIN & THYROGLOBULIN AB; Future; Expected date: 05/09/2023    Type 2 diabetes mellitus with hyperglycemia, with long-term current use of insulin (HCC)  -     HUMALOG KWIKPEN INSULIN 100 unit/mL subcutaneous PEN; 20 units ( if drinking pepsi) with meals. Take only 10 units if eating a small meal.  Indications: type 2 diabetes mellitus    Mixed hyperlipidemia  -     LIPID PROFILE; Future; Expected date: 05/09/2023      Jimmy Waters Jimmy Waters was seen today for diabetes.    Uncontrolled Type 2 diabetes mellitus with hyperglycemia, with long-term current use of insulin (HCC)  Diet is not good.  He has hyperglycemia after meals.    Continue Soliqua 25 units.   Increase pre meal to 20 units ( if drinking pepsi)  Take only 10 units if eating a small meal.  Humalog or Novolog:  If blood sugar 151-200: Take extra 2 unit.  If blood sugar 201-250: Take extra 4 units.  If blood sugar 251-300: Take extra 6 units.  If blood sugar is 301-350: Take extra 8 units.  If blood sugars > 350: Take extra 10 units.    Postoperative hypothyroidism  Thyroid cancer [facility]):  Papillary thyroid cancer and Hurthle cell Thyroid cancer: TTF-1 positive  PET scan shows 2 nodules in the neck.  Goal TSH to be suppressed.    Thyroglobulin detectable, suggestive of biochemical disease but US neck negative for mass/ LN.   TSH suppressed.    He is currently on levothyroxine 275 mcg daily.   Recheck labs.    Age-related osteoporosis with current pathological fracture with routine healing  07/2021 Mid femoral diaphyseal fracture with progressive incomplete fracture healing > fall from standing position. Low fragility. ---> non healing, F/up w Ortho   Bone density normal.    Received one dose Reclast 05/2022 [facility] hospital )  No interval falls or fractures.     Next dose in March 2025.  Will fax the orders to [facility].        40 minutes spent discussing multiple medical problems, discussing treatment plan with the patient, wife and documentation.  Electronically signed by Lucrezia Starch, MD 05/09/2023

## 2023-05-09 NOTE — Progress Notes
CGM note    Patient's Personal continuous glucose monitor Dexcom download reviewed.  At least 72 hours of data reviewed and interpreted.  Data interpreted 05/09/2023  Data interpreted from January 31 to May 09, 2023    Findings:   Average glucose 201  GMI 8.1%  Glucose variability glucose variability 25.4%  Target glucose 34%  High 48%  Very High 18%  Low glucose 0    Interpretation hyperglycemia after meals.    Continuous glucose monitor report scanned in epic.    Electronically signed by Lucrezia Starch, MD 05/09/2023

## 2023-05-09 NOTE — Patient Instructions
Continue Soliqua 25 units.   Increase pre meal to 20 units ( if drinking pepsi)  Take only 10 units if eating a small meal.   Humalog or Novolog:  If blood sugar 151-200: Take extra 2 unit.  If blood sugar 201-250: Take extra 4 units.  If blood sugar 251-300: Take extra 6 units.  If blood sugar is 301-350: Take extra 8 units.  If blood sugars > 350: Take extra 10 units.

## 2023-05-10 ENCOUNTER — Encounter
Admit: 2023-05-10 | Discharge: 2023-05-10 | Payer: MEDICARE | Primary: Student in an Organized Health Care Education/Training Program

## 2023-05-10 DIAGNOSIS — C73 Malignant neoplasm of thyroid gland: Secondary | ICD-10-CM

## 2023-05-10 DIAGNOSIS — Z794 Long term (current) use of insulin: Secondary | ICD-10-CM

## 2023-05-10 DIAGNOSIS — E034 Atrophy of thyroid (acquired): Secondary | ICD-10-CM

## 2023-05-10 DIAGNOSIS — E782 Mixed hyperlipidemia: Secondary | ICD-10-CM

## 2023-05-14 ENCOUNTER — Encounter
Admit: 2023-05-14 | Discharge: 2023-05-14 | Payer: MEDICARE | Primary: Student in an Organized Health Care Education/Training Program

## 2023-05-18 ENCOUNTER — Encounter
Admit: 2023-05-18 | Discharge: 2023-05-18 | Payer: MEDICARE | Primary: Student in an Organized Health Care Education/Training Program

## 2023-05-20 ENCOUNTER — Encounter
Admit: 2023-05-20 | Discharge: 2023-05-20 | Payer: MEDICARE | Primary: Student in an Organized Health Care Education/Training Program

## 2023-05-20 DIAGNOSIS — E1165 Type 2 diabetes mellitus with hyperglycemia: Secondary | ICD-10-CM

## 2023-05-20 NOTE — Telephone Encounter
 Question, pt has a wound vac and is scheduled for a dexa tomorrow. It's due to a cyst removed and the vac was placed to help healing. It's a closed system, the wound is on his lower back, 6 inches above his waist line, with a covered with a dressing, and no tissue is exposed. Spouse is calling to confirm pt can proceed with his dexa scheduled for tomorrow? This RN informed the spouse the patient can proceed with the dexa tomorrow as scheduled. Sharl Ma verbalized understanding with no further questions or concerns.

## 2023-05-21 ENCOUNTER — Ambulatory Visit
Admit: 2023-05-21 | Discharge: 2023-05-21 | Payer: MEDICARE | Primary: Student in an Organized Health Care Education/Training Program

## 2023-05-21 ENCOUNTER — Ambulatory Visit
Admit: 2023-05-21 | Discharge: 2023-05-22 | Payer: MEDICARE | Primary: Student in an Organized Health Care Education/Training Program

## 2023-05-21 ENCOUNTER — Encounter
Admit: 2023-05-21 | Discharge: 2023-05-21 | Payer: MEDICARE | Primary: Student in an Organized Health Care Education/Training Program

## 2023-05-24 ENCOUNTER — Encounter
Admit: 2023-05-24 | Discharge: 2023-05-24 | Payer: MEDICARE | Primary: Student in an Organized Health Care Education/Training Program

## 2023-05-24 DIAGNOSIS — N179 Acute kidney failure, unspecified: Secondary | ICD-10-CM

## 2023-05-24 DIAGNOSIS — Z9889 Other specified postprocedural states: Secondary | ICD-10-CM

## 2023-05-24 NOTE — Telephone Encounter
 I called pt to discuss the results of his recent BMPs.  His creatinine was elevated when recently checked and a recheck showed it to be elevated further.  He has since had another value which was improved but was still above his baseline. I recommend proceeding with an [place] to evaluate for hydronephrosis.  Order placed.     Latest Reference Range & Units 04/12/23 10:36 05/09/23 10:17 05/09/23 11:00 05/21/23 12:12   Sodium 137 - 147 mmol/L 141  142 142   Potassium 3.5 - 5.1 mmol/L 4.6  4.2 4.4   Chloride 98 - 110 mmol/L 106  103 104   CO2 21 - 30 mmol/L 23  28 26    Anion Gap 3 - 12  12  11 12    Blood Urea Nitrogen 7 - 25 mg/dL 28 (H)  28 (H) 27 (H)   Creatinine 0.40 - 1.24 mg/dL 1.61 (H)  0.96 (H) 0.45 (H)   Glomerular Filtration Rate (GFR) >60 mL/min 40 (L)  34 (L) 46 (L)   Glucose 70 - 100 mg/dL 409 (H)  811 (H) 914 (H)   Albumin 3.5 - 5.0 g/dL    4.6   Calcium 8.5 - 10.6 mg/dL 9.5  9.9 9.4   Total Bilirubin 0.2 - 1.3 mg/dL    0.4   Total Protein 6.0 - 8.0 g/dL    8.4 (H)   Poc Hemoglobin A1C 4 - 6 %  7.5 !     Prealbumin 17.0 - 34.0 mg/dL    78.2 (H)   (H): Data is abnormally high  (L): Data is abnormally low  !: Data is abnormal

## 2023-05-31 ENCOUNTER — Encounter
Admit: 2023-05-31 | Discharge: 2023-05-31 | Payer: MEDICARE | Primary: Student in an Organized Health Care Education/Training Program

## 2023-06-06 ENCOUNTER — Encounter
Admit: 2023-06-06 | Discharge: 2023-06-06 | Payer: MEDICARE | Primary: Student in an Organized Health Care Education/Training Program

## 2023-06-11 ENCOUNTER — Encounter
Admit: 2023-06-11 | Discharge: 2023-06-11 | Payer: MEDICARE | Primary: Student in an Organized Health Care Education/Training Program

## 2023-06-11 ENCOUNTER — Ambulatory Visit
Admit: 2023-06-11 | Discharge: 2023-06-11 | Payer: MEDICARE | Primary: Student in an Organized Health Care Education/Training Program

## 2023-06-14 ENCOUNTER — Encounter
Admit: 2023-06-14 | Discharge: 2023-06-14 | Payer: MEDICARE | Primary: Student in an Organized Health Care Education/Training Program

## 2023-06-14 NOTE — Telephone Encounter
 I called and spoke with pt's wife regarding US findings.  No evidence of hydronephrosis.  Continue f/u as scheduled.  She did relay that he was in the hospital in [place], [place] recently and had a CT scan. I will request the images to be clouded.  Continue f/u as scheduled.

## 2023-06-17 ENCOUNTER — Encounter
Admit: 2023-06-17 | Discharge: 2023-06-17 | Payer: MEDICARE | Primary: Student in an Organized Health Care Education/Training Program

## 2023-06-19 ENCOUNTER — Encounter
Admit: 2023-06-19 | Discharge: 2023-06-19 | Payer: MEDICARE | Primary: Student in an Organized Health Care Education/Training Program

## 2023-06-19 NOTE — Telephone Encounter
 Called and spoke with [facility] in [place] to request that CT report from 3/4 is faxed to our office and images are clouded to [place]. Medical Records department will complete request.

## 2023-06-20 ENCOUNTER — Encounter
Admit: 2023-06-20 | Discharge: 2023-06-20 | Payer: MEDICARE | Primary: Student in an Organized Health Care Education/Training Program

## 2023-06-21 ENCOUNTER — Encounter: Admit: 2023-06-21 | Discharge: 2023-06-21 | Primary: Student in an Organized Health Care Education/Training Program

## 2023-06-27 IMAGING — CR AAS
5 series · 5 of 5 positions shown · non-contrast
Comparison: none

[x chest ap]
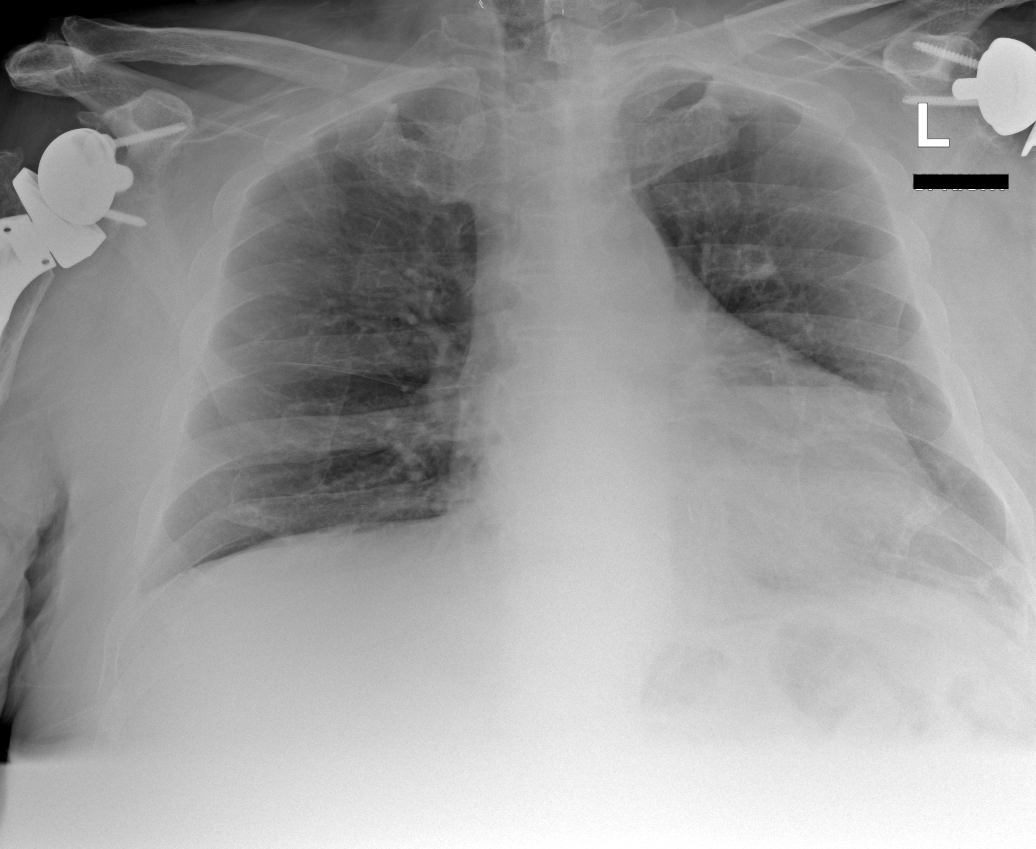

[x abdomen (1 of 2)]
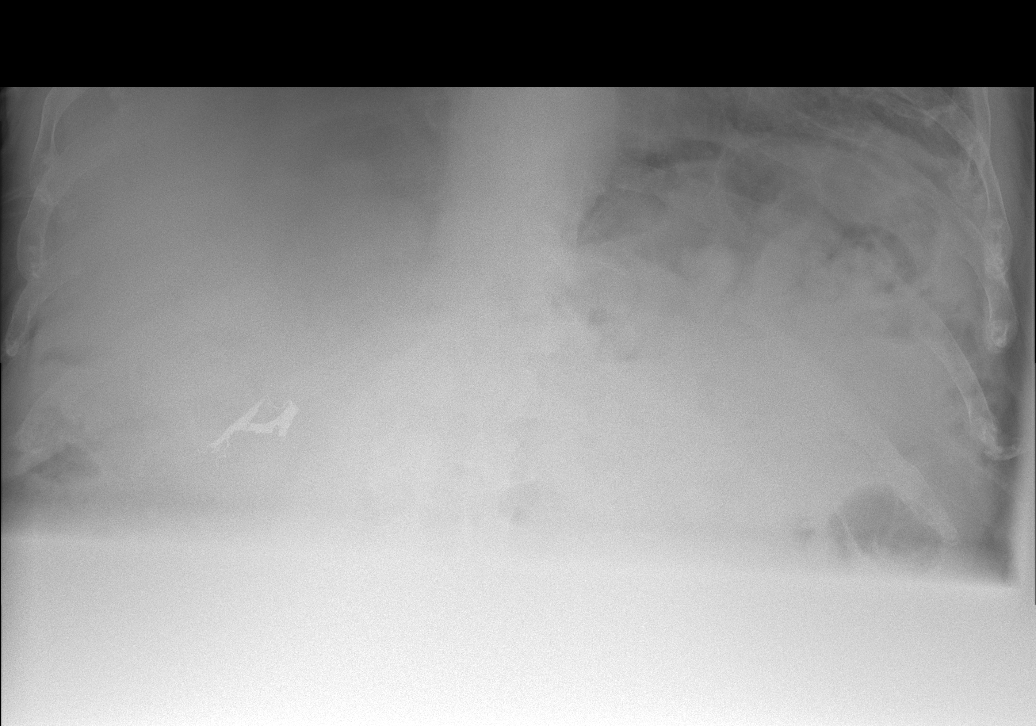

[x abdomen (2 of 2)]
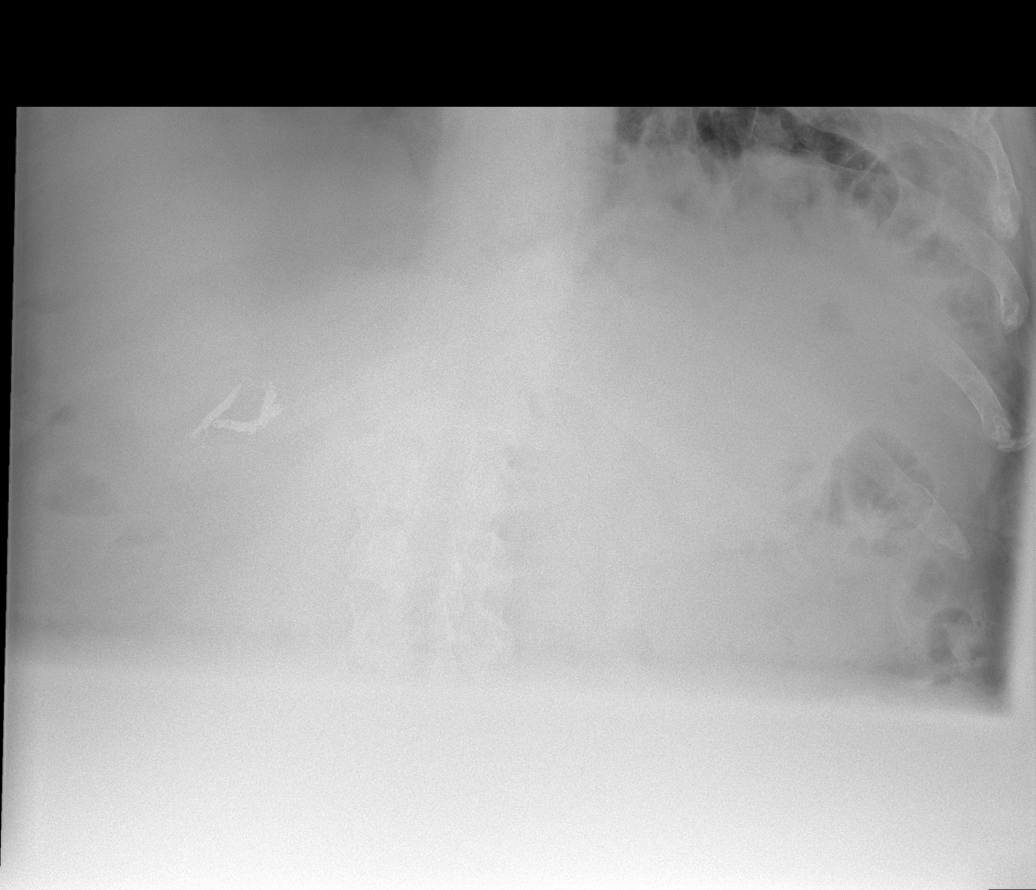

[t abdomen (1 of 2)]
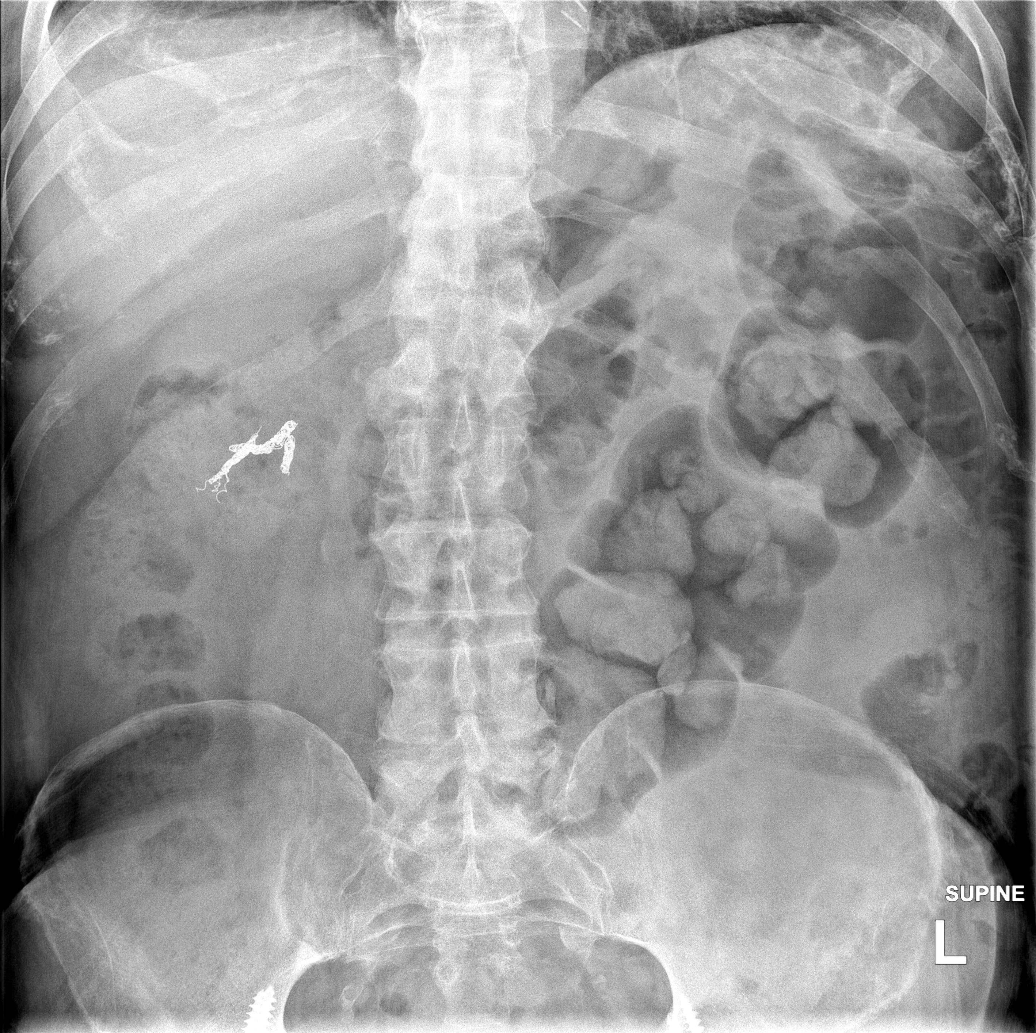

[t abdomen (2 of 2)]
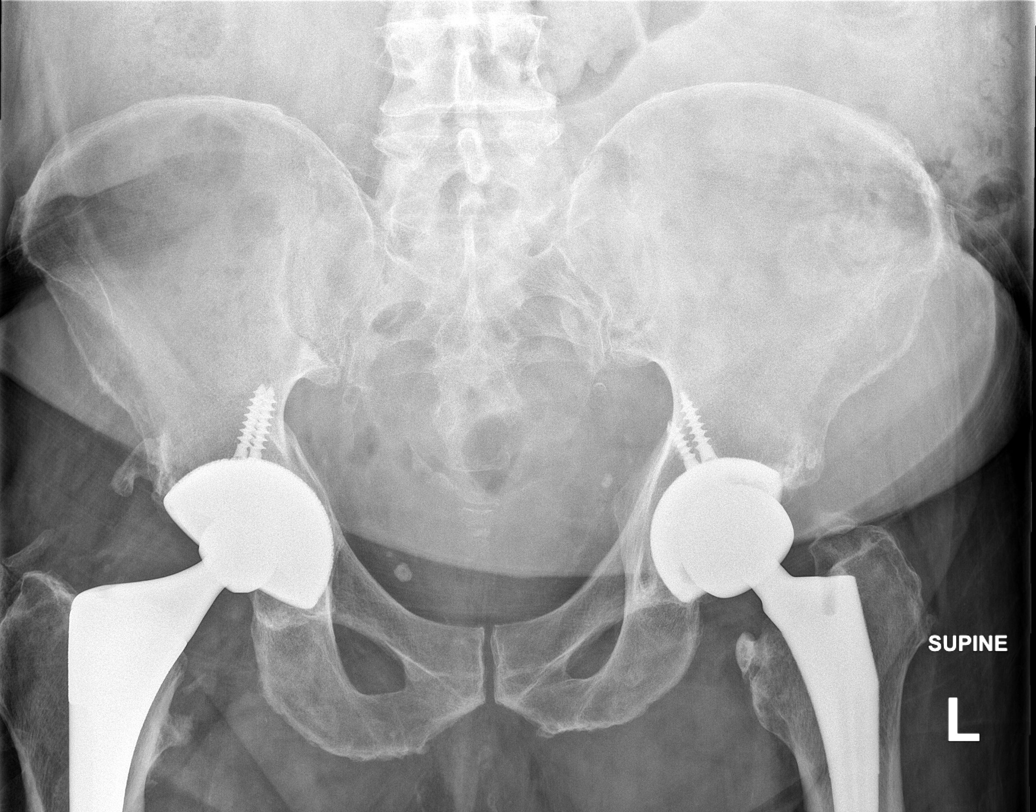

[5 of 5 positions shown; findings below may reference images not displayed]

DIAGNOSTIC STUDIES

EXAM

XR acute abdomen series

INDICATION

abdominal pain/constipation
CONSTIPATION; HX OF DIABETES AND HERNIA

TECHNIQUE

Standard acute abdominal series was obtained.

COMPARISONS

Chest x-ray October 17, 2021

FINDINGS

Cardiomegaly is evident. No acute infiltrates are seen. Bilateral shoulder arthroplasty is noted.

No free air is evident. There is moderate air and fecal material throughout the colon. Minimal small
bowel gas is seen. Surgical coils are noted within the right upper quadrant. Bilateral hip
arthroplasty is noted.

IMPRESSION

No evidence for free air or obstruction. There is moderate retained fecal material throughout the
colon.

Postoperative changes as described.

Tech Notes:

CONSTIPATION; HX OF DIABETES AND HERNIA

## 2023-07-03 ENCOUNTER — Encounter: Admit: 2023-07-03 | Discharge: 2023-07-03 | Primary: Student in an Organized Health Care Education/Training Program

## 2023-09-19 ENCOUNTER — Encounter
Admit: 2023-09-19 | Discharge: 2023-09-19 | Payer: MEDICARE | Primary: Student in an Organized Health Care Education/Training Program

## 2023-09-23 ENCOUNTER — Encounter
Admit: 2023-09-23 | Discharge: 2023-09-23 | Payer: MEDICARE | Primary: Student in an Organized Health Care Education/Training Program

## 2023-09-23 DIAGNOSIS — M978XXA Periprosthetic fracture around other internal prosthetic joint, initial encounter: Principal | ICD-10-CM

## 2023-09-23 DIAGNOSIS — Z09 Encounter for follow-up examination after completed treatment for conditions other than malignant neoplasm: Secondary | ICD-10-CM

## 2023-09-24 ENCOUNTER — Encounter
Admit: 2023-09-24 | Discharge: 2023-09-24 | Payer: MEDICARE | Primary: Student in an Organized Health Care Education/Training Program

## 2023-09-24 ENCOUNTER — Ambulatory Visit
Admit: 2023-09-24 | Discharge: 2023-09-24 | Payer: MEDICARE | Primary: Student in an Organized Health Care Education/Training Program

## 2023-09-24 ENCOUNTER — Ambulatory Visit
Admit: 2023-09-24 | Discharge: 2023-09-25 | Payer: MEDICARE | Primary: Student in an Organized Health Care Education/Training Program

## 2023-09-24 DIAGNOSIS — M978XXA Periprosthetic fracture around other internal prosthetic joint, initial encounter: Principal | ICD-10-CM

## 2023-09-24 NOTE — Progress Notes
 Date of Service: 09/24/2023         History of Present Illness     Jimmy Waters is a 68 year old male with a history of bilateral knee replacements who presents with right knee pain and foot drop.    Right knee pain and dysfunction  - Significant pain and functional limitation in the right knee following total knee arthroplasty performed approximately six months ago, concurrent with left knee replacement  - History of prior femur fracture repair on the right side  - Concern regarding the prosthetic stem potentially reaching near the previous fracture site  - Currently achieves 70 degrees of range of motion in the right knee  - Revision surgery for the right knee is under consideration    Left knee postoperative status  - Left knee revision arthroplasty performed in April 2025  - Currently achieves 115 degrees of range of motion in the left knee  - Left knee rehabilitation has been more successful compared to the right    Right foot drop  - Development of right-sided foot drop following knee surgery  - Uses an over-the-counter ankle-foot orthosis (AFO) brace from [employer] with marginal effectiveness  - Partial ability to dorsiflex the ankle, but difficulty with lateral foot movement    Rehabilitation and physical therapy  - Attended rehabilitation at [facility] with primary focus on the left leg  - Currently working with a physical therapist named Adam at [place]  - Ongoing rehabilitation for bilateral knees              Assessment and Plan:      Right knee pain and dysfunction  Chronic pain and dysfunction post-knee replacement. Femur fracture healed and aligned, supporting revision surgery.  - Proceed with revision surgery as planned by Dr. Lenon.  - Confirm agreement with Dr. Lenon on surgical plan.    Foot drop  Right foot drop post-surgery. Partial dorsiflexion and toe pull-up indicate incomplete foot drop. Peroneal tendon weakness noted. Undergoing rehabilitation.  - Consider carbon fiber foot drop brace for improved support.  - Continue rehabilitation with physical therapist Adam at [place].  - Reassess brace needs if rehabilitation does not yield improvement.         20 minute total encounter time to include record review, history, examination, discussion of my findings, recommendations, and documentation.

## 2023-09-25 ENCOUNTER — Encounter
Admit: 2023-09-25 | Discharge: 2023-09-25 | Payer: MEDICARE | Primary: Student in an Organized Health Care Education/Training Program

## 2023-09-25 DIAGNOSIS — E890 Postprocedural hypothyroidism: Principal | ICD-10-CM

## 2023-09-25 DIAGNOSIS — C73 Malignant neoplasm of thyroid gland: Secondary | ICD-10-CM

## 2023-09-25 MED ORDER — LEVOTHYROXINE 200 MCG PO TAB
ORAL_TABLET | ORAL | 0 refills | 30.00000 days | Status: AC
Start: 2023-09-25 — End: ?

## 2023-10-08 ENCOUNTER — Encounter
Admit: 2023-10-08 | Discharge: 2023-10-08 | Payer: MEDICARE | Primary: Student in an Organized Health Care Education/Training Program

## 2023-10-11 ENCOUNTER — Encounter
Admit: 2023-10-11 | Discharge: 2023-10-11 | Payer: MEDICARE | Primary: Student in an Organized Health Care Education/Training Program

## 2023-10-11 ENCOUNTER — Ambulatory Visit
Admit: 2023-10-11 | Discharge: 2023-10-11 | Payer: MEDICARE | Primary: Student in an Organized Health Care Education/Training Program

## 2023-10-11 ENCOUNTER — Ambulatory Visit
Admit: 2023-10-11 | Discharge: 2023-10-12 | Payer: MEDICARE | Primary: Student in an Organized Health Care Education/Training Program

## 2023-10-15 ENCOUNTER — Encounter
Admit: 2023-10-15 | Discharge: 2023-10-15 | Payer: MEDICARE | Primary: Student in an Organized Health Care Education/Training Program

## 2023-10-15 DIAGNOSIS — C73 Malignant neoplasm of thyroid gland: Principal | ICD-10-CM

## 2023-10-21 ENCOUNTER — Encounter
Admit: 2023-10-21 | Discharge: 2023-10-21 | Payer: MEDICARE | Primary: Student in an Organized Health Care Education/Training Program

## 2023-10-22 ENCOUNTER — Encounter
Admit: 2023-10-22 | Discharge: 2023-10-22 | Payer: MEDICARE | Primary: Student in an Organized Health Care Education/Training Program

## 2023-10-31 ENCOUNTER — Ambulatory Visit
Admit: 2023-10-31 | Discharge: 2023-10-31 | Payer: MEDICARE | Primary: Student in an Organized Health Care Education/Training Program

## 2023-10-31 ENCOUNTER — Encounter
Admit: 2023-10-31 | Discharge: 2023-10-31 | Payer: MEDICARE | Primary: Student in an Organized Health Care Education/Training Program

## 2023-10-31 DIAGNOSIS — M8000XD Age-related osteoporosis with current pathological fracture, unspecified site, subsequent encounter for fracture with routine healing: Secondary | ICD-10-CM

## 2023-10-31 DIAGNOSIS — W19XXXS Unspecified fall, sequela: Secondary | ICD-10-CM

## 2023-10-31 DIAGNOSIS — E559 Vitamin D deficiency, unspecified: Secondary | ICD-10-CM

## 2023-10-31 DIAGNOSIS — R531 Weakness: Secondary | ICD-10-CM

## 2023-10-31 DIAGNOSIS — E1165 Type 2 diabetes mellitus with hyperglycemia: Principal | ICD-10-CM

## 2023-10-31 MED ORDER — SOLIQUA 100/33 100 UNIT-33 MCG/ML SC INPN
30 [IU] | Freq: Every evening | SUBCUTANEOUS | 1 refills | Status: AC
Start: 2023-10-31 — End: ?

## 2023-10-31 MED ORDER — GLUCAGON 1 MG IJ SOLR
INTRAMUSCULAR | 1 refills | 16.00000 days | Status: AC
Start: 2023-10-31 — End: ?

## 2023-10-31 MED ORDER — HUMALOG KWIKPEN INSULIN 100 UNIT/ML SC INPN
20 [IU] | Freq: Three times a day (TID) | SUBCUTANEOUS | 1 refills | 39.50000 days | Status: AC
Start: 2023-10-31 — End: ?

## 2023-10-31 MED ORDER — PEN NEEDLE, DIABETIC 29 GAUGE X 1/2" MISC NDLE
1 | Freq: Before meals | 1 refills | 90.00000 days | Status: AC
Start: 2023-10-31 — End: ?

## 2023-10-31 NOTE — Patient Instructions
Take the thyroid medicine empty stomach in morning with water. Wait for atleast one hour before eating anything after taking thyroid medicine. No iron/calcium pills/multivitamin for 4 hours before and after taking thyroid pill. Take 2 pills the next morning if you forget to take the medicine in morning.

## 2023-10-31 NOTE — Progress Notes
 Chief Complaint   Patient presents with    Diabetes        Date of Service: 10/31/2023    Note to patient: The 21st Century Cures Act makes medical notes like these available to patients in the interest of transparency. However, be advised this is a medical document. It is intended as peer to peer communication. It is written in medical language and may contain abbreviations or verbiage that are unfamiliar. It may appear blunt or direct. Medical documents are intended to carry relevant information, facts as evident, and the clinical opinion of the practitioner.    Jimmy Waters is a 68 y.o. male. DOB: [birth year]   MRN#: [id number]    HPI:  Seen today for thyroid cancer.  Reviewed oncology notes.  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  Jan 2018 - RAI 154 mCi Post treatment scan showed some uptake on thyroid bed and mediastinal nodes.   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 She removed a nodule near his thyroidectomy scar.  Pathology confirmed metastatic Papillary Thyroid Cancer.  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  07/13/2020 CT Neck w/contrast [facility]  1. No CT evidence of a suspicious neck mass or suspicious lymphadenopathy by CT size criteria.  2. Symmetric prominence of bilateral false and true vocal cores at the level of the hyoid bone and correlate with clinical exam.     07/27/21 Revision Right Central Neck Dissection.  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  11/2021: 150 mCi RAI treatment.   Low-level uptake in a left supraclavicular lymph node which could represent nodal metastasis.   01/2022 US   Left: *  Just posterior to the medial left clavicle, there is a subtle, ovoid lymph node with prominent fatty hilum which demonstrates a short axis diameter of 6 mm (best appreciated on cine images). This likely   corresponds to an area of low-grade radiotracer uptake on the SPECT-CT 12/20/2021.     He is on levothyroxine  275 mg micrograms daily.  He has been compliant with his Ltx.   Reviewed labs     Type 2 DM for many years.  Glucose readings are higher.  Current treatment:  Soliqua  25 units , Pre meal Novolog  16-18 units.     He has reduced the Pepsi intake.    He was on Trulicity  in the past but it was stopped due to thyroid cancer diagnosis.  Trulicity  was expensive.  He was on Jardiance but it was stopped when he developed bladder issues . 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 He has h/o recurrent UTIs.     Osteoporosis W FRAGILITY FRACTURE.   Received Reclast  05/25/2022, 05/2023. No side effects     07/2021 Mid femoral diaphyseal fracture with progressive incomplete fracture healing > fall from standing position. Low fragility.  Bone density normal 11/2021  Drinks glass of milk at night.    Gait is unstable. He does not feel steady.      Review of Systems   Constitutional:  Negative for fever.   Cardiovascular:  Negative for chest pain.         Objective:  acetaminophen  (TYLENOL  EXTRA STRENGTH) 500 mg tablet Take two tablets by mouth daily as needed. Max of 4,000 mg of acetaminophen  in 24 hours.    ascorbic acid (vitamin C) (VITAMIN C) 1,000 mg tablet Take one tablet by mouth at bedtime daily.    atorvastatin  (LIPITOR) 20 mg tablet Take one tablet by mouth daily.    betamethasone dipropionate (BETANATE) 0.05 % topical cream Apply  topically to affected area twice weekly.    buPROPion  HCL SR (WELLBUTRIN  SR) 150 mg tablet, 12 hr sustained-release Take one tablet by mouth twice daily.    CHOLEcalciferoL (vitamin D3) (DIALYVITE VITAMIN D) 125 mcg (5,000 unit) capsule Take one capsule by mouth at bedtime daily.    DEXCOM G7 SENSOR sensor device Use one each as directed every 10 days. Indications: type 2 diabetes mellitus    diclofenac sodium (VOLTAREN) 1 % topical gel Apply  topically to affected area daily as needed.    ferrous sulfate, dried (IRON) 159 mg (45 mg iron) TbER Take one tablet by mouth at bedtime daily.    gabapentin  (NEURONTIN ) 600 mg tablet Take two tablets by mouth twice daily.    glucagon  1 mg/1 mL injection solution Inject 1 mg into the upper arm, thigh, or buttocks if needed for severe hypoglycemia and then call 911.    HUMALOG  KWIKPEN INSULIN  100 unit/mL subcutaneous PEN Inject twenty Units under the skin three times daily with meals. 20 units ( if drinking pepsi) with meals. Take only 10 units if eating a small meal.  Indications: type 2 diabetes mellitus    insulin  glargine-lixisenatide (SOLIQUA  100/33) 100 unit-33 mcg/mL syringe Inject thirty Units under the skin at bedtime daily. May titrate back up to previous dose of 25 units once daily as you begin eating normally.  Indications: type 2 diabetes mellitus    insulin  pen needles (disposable) (LITE TOUCH INSULIN  PEN NEEDLES) 29 gauge x 1/2 pen needle Use one each as directed before meals and at bedtime.    ketoconazole (NIZORAL) 2 % topical cream Apply  topically to affected area daily as needed.    ketoconazole (NIZORAL) 2 % topical shampoo Apply  topically to affected area once. Apply topically to affected area of damp skin, lather, leave on 5 minutes, and rinse. Repeat two-three times weekly    levothyroxine  (SYNTHROID ) 200 mcg tablet TAKE 1 TABLET BY MOUTH ONCE DAILY 30 MINUTES BEFORE BREAKFAST, TOTAL OF 300MCG DAILY    levothyroxine  (SYNTHROID ) 75 mcg tablet Take one tablet by mouth daily 30 minutes before breakfast.    losartan  (COZAAR ) 25 mg tablet Take one tablet by mouth daily. Hold until follow up  Indications: high blood pressure    melatonin 10 mg tablet Take one-half tablet by mouth at bedtime daily. Indications: difficulty sleeping    metoprolol  tartrate (LOPRESSOR ) 50 mg tablet Take one tablet by mouth twice daily.    Miscellaneous Medical Supply misc Urostomy supplies and accessories  Dispense twenty of each for one month of supplies  Dx Bilateral hydronephrosis N13.30  Indications: Urostomy    MULTIVITAMIN PO Take 1 tablet by mouth at bedtime daily.    ondansetron  (ZOFRAN  ODT) 8 mg rapid dissolve tablet Dissolve one tablet by mouth every 8 hours as needed.    ONETOUCH ULTRA TEST test strip Use one strip three times daily to check blood glucose. Max daily: 3  Indications: type 2 diabetes mellitus    ONETOUCH ULTRA2 METER kit Use one strip as directed three times daily before meals.    pantoprazole   DR (PROTONIX ) 40 mg tablet Take one tablet by mouth twice daily.    polyethylene glycol 3350  (MIRALAX ) 17 gram/dose powder Take seventeen g by mouth daily. Indications: constipation    sennosides-docusate sodium  (SENOKOT-S) 8.6/50 mg tablet Take two tablets by mouth twice daily. Indications: constipation    triamcinolone acetonide (KENALOG) 0.025 % topical cream Apply  topically to affected area daily as needed.    zinc  sulfate 220 mg (50 mg elemental zinc ) capsule Take one capsule by mouth at bedtime daily.     Vitals:    10/31/23 1123 10/31/23 1130   BP: (!) 152/67 (!) 155/89   BP Source: Arm, Left Upper Arm, Right Upper   Pulse: 79    Temp: 36.3 ?C (97.4 ?F)    SpO2: 99%    TempSrc: Temporal    PainSc: Five    Weight: 120 kg (264 lb 9.6 oz)    Height: 185.4 cm (6' 1)        Body mass index is 34.91 kg/m?SABRA     Physical Exam  Vitals and nursing note reviewed.   Constitutional:       General: He is not in acute distress.     Appearance: Normal appearance. He is not ill-appearing, toxic-appearing or diaphoretic.   HENT:      Head: Normocephalic and atraumatic.      Nose: Nose normal.   Eyes:      Conjunctiva/sclera: Conjunctivae normal.   Neurological:      Mental Status: He is alert and oriented to person, place, and time.   Psychiatric: Mood and Affect: Mood normal.               Lab    Latest Reference Range & Units 10/12/22 15:47   T4-Free 0.6 - 1.6 NG/DL 1.3   TSH 9.64 - 4.99 MCU/ML <0.01 (L)   Thyroglobulin Ab <116 IU/mL 16   Thyroglobulin 1.7 - 55.0 NG/ML 1.0 (L)     US  Neck 09/2022 : Changes of total thyroidectomy. No evidence of local recurrence.   2. No suspicious anterior cervical lymph nodes. The previously described   small lymph node with a prominent fatty hilum just posterior to the medial   left clavicle is not seen today.       Assessment and Plan:  Jimmy Waters was seen today for diabetes.    Diagnoses and all orders for this visit:    Uncontrolled type 2 diabetes mellitus with hyperglycemia, with long-term current use of insulin  (CMS-HCC)  -     POC GLUCOSE QUANTITATIVE BLOOD  -     POC HEMOGLOBIN A1C  -     GLUCOSE METER DOWNLOAD  -     insulin  pen needles (disposable) (LITE TOUCH INSULIN  PEN NEEDLES) 29 gauge x 1/2 pen needle; Use one each as directed before meals and at bedtime.  -     AMB REFERRAL TO GERIATRICS    Thyroid cancer (CMS-HCC)  -     THYROGLOBULIN BY LC-MS/MS, SERUM/PLASMA; Future; Expected date: 10/31/2023    Type 2 diabetes mellitus with hyperglycemia, with long-term current use of insulin  (CMS-HCC)  -     HUMALOG  KWIKPEN INSULIN  100 unit/mL subcutaneous PEN; Inject twenty Units under the skin three times daily with meals. 20 units ( if drinking pepsi) with meals. Take only 10 units if eating a small meal.  Indications: type 2 diabetes mellitus    Weakness  -     AMB REFERRAL TO GERIATRICS  Fall, sequela  -     AMB REFERRAL TO GERIATRICS    Age-related osteoporosis with current pathological fracture with routine healing  -     25-OH VITAMIN D (D2 + D3); Future; Expected date: 10/31/2023    Vitamin D deficiency    Other orders  -     insulin  glargine-lixisenatide (SOLIQUA  100/33) 100 unit-33 mcg/mL syringe; Inject thirty Units under the skin at bedtime daily. May titrate back up to previous dose of 25 units once daily as you begin eating normally.  Indications: type 2 diabetes mellitus  -     glucagon  1 mg/1 mL injection solution; Inject 1 mg into the upper arm, thigh, or buttocks if needed for severe hypoglycemia and then call 911.      Jimmy Waters was seen today for diabetes.    Uncontrolled Type 2 diabetes mellitus with hyperglycemia, with long-term current use of insulin  (HCC)  He has hyperglycemia after meals and fasting.    Increase Soliqua  to 30 units.   Increase pre meal to 20 units ( if drinking pepsi)  Take only 10 units if eating a small meal.   Humalog  or Novolog :  If blood sugar 151-200: Take extra 2 unit.  If blood sugar 201-250: Take extra 4 units.  If blood sugar 251-300: Take extra 6 units.  If blood sugar is 301-350: Take extra 8 units.  If blood sugars > 350: Take extra 10 units.    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  Goal TSH to be suppressed.    Thyroglobulin detectable, suggestive of biochemical disease but US  neck negative for mass/ LN.   TSH suppressed.    He is currently on levothyroxine  275 mcg daily.   Recheck thyroglobulin by mass spect.  Plan to repeat CT scan in January 2026.    Age-related osteoporosis with current pathological fracture with routine healing  07/2021 Mid femoral diaphyseal fracture with progressive incomplete fracture healing > fall from standing position. Low fragility. ---> non healing, F/up w Ortho   Bone density normal.  Check Vit D.    Received one dose Reclast  05/2022 [facility] hospital), 05/2023  No interval falls or fractures.     Next dose in March 2026.    Refer to geriatrics for management of multiple medical problems, gait instability and risk of fall.        40 minutes spent discussing multiple medical problems, discussing treatment plan with the patient, wife and documentation.  Electronically signed by Durward Dad, MD 10/31/2023

## 2023-10-31 NOTE — Progress Notes
 CGM note    Patient's Personal continuous glucose monitor Dexcom download reviewed.  At least 72 hours of data reviewed and interpreted.  Data interpreted 10/31/2023  Data interpreted from July 25 to October 31, 2023    Findings:   Average glucose 211  GMI 8.4%  Glucose variability 51  Target glucose 26%  High 51%  Very High 23%  Low glucose 0    Interpretation fasting and postprandial hyperglycemia    Continuous glucose monitor report scanned in epic.    Electronically signed by Durward Dad, MD 10/31/2023

## 2023-11-01 DIAGNOSIS — C73 Malignant neoplasm of thyroid gland: Secondary | ICD-10-CM

## 2023-11-01 DIAGNOSIS — Z794 Long term (current) use of insulin: Secondary | ICD-10-CM

## 2023-11-04 ENCOUNTER — Encounter
Admit: 2023-11-04 | Discharge: 2023-11-04 | Payer: MEDICARE | Primary: Student in an Organized Health Care Education/Training Program

## 2023-11-13 ENCOUNTER — Encounter
Admit: 2023-11-13 | Discharge: 2023-11-13 | Payer: MEDICARE | Primary: Student in an Organized Health Care Education/Training Program

## 2023-11-13 DIAGNOSIS — C73 Malignant neoplasm of thyroid gland: Principal | ICD-10-CM

## 2023-11-13 DIAGNOSIS — C801 Malignant (primary) neoplasm, unspecified: Secondary | ICD-10-CM

## 2023-11-13 NOTE — Telephone Encounter
 Dr. Skip would like PET scan done for patient. After confirming name/DOB, patient and patient's wife were agreeable to PET scan. They would like to get it done at [facility] [address] location. Conference call with radiology done and scheduled per patient's preference. Patient and patient's wife agreeable to time and date. No other questions/concerns.

## 2023-11-18 ENCOUNTER — Encounter
Admit: 2023-11-18 | Discharge: 2023-11-18 | Payer: MEDICARE | Primary: Student in an Organized Health Care Education/Training Program

## 2023-11-19 ENCOUNTER — Encounter
Admit: 2023-11-19 | Discharge: 2023-11-19 | Payer: MEDICARE | Primary: Student in an Organized Health Care Education/Training Program

## 2023-11-20 ENCOUNTER — Encounter
Admit: 2023-11-20 | Discharge: 2023-11-20 | Payer: MEDICARE | Primary: Student in an Organized Health Care Education/Training Program

## 2023-11-20 DIAGNOSIS — C73 Malignant neoplasm of thyroid gland: Secondary | ICD-10-CM

## 2023-11-20 DIAGNOSIS — E890 Postprocedural hypothyroidism: Principal | ICD-10-CM

## 2023-11-20 MED ORDER — LEVOTHYROXINE 75 MCG PO TAB
75 ug | ORAL_TABLET | Freq: Every day | ORAL | 1 refills | 30.00000 days | Status: AC
Start: 2023-11-20 — End: ?

## 2023-11-20 MED ORDER — LEVOTHYROXINE 200 MCG PO TAB
200 ug | ORAL_TABLET | Freq: Every day | ORAL | 1 refills | 30.00000 days | Status: AC
Start: 2023-11-20 — End: ?

## 2023-11-27 ENCOUNTER — Encounter
Admit: 2023-11-27 | Discharge: 2023-11-27 | Payer: MEDICARE | Primary: Student in an Organized Health Care Education/Training Program

## 2023-11-29 ENCOUNTER — Encounter
Admit: 2023-11-29 | Discharge: 2023-11-29 | Payer: MEDICARE | Primary: Student in an Organized Health Care Education/Training Program

## 2023-11-29 NOTE — Telephone Encounter
 Contacted pt and after confirming pt name/DOB, discussed that Dr. Skip would like to set up a [facility] appointment to review PET scan results. Pt agreeable to this. Pt agreeable to date/time. Denies further needs/questions.

## 2023-12-20 ENCOUNTER — Encounter
Admit: 2023-12-20 | Discharge: 2023-12-20 | Payer: MEDICARE | Primary: Student in an Organized Health Care Education/Training Program

## 2023-12-20 NOTE — Progress Notes
 Dr. Skip spoke with patient. Patient is currently hospitalized for major orthopedic surgery. Toe tap touch for 2 weeks per patient.     Discussed PET scan findings with Dr. Skip, patient would like left neck dissection surgery based on his PET scan findings and Dr. Skip aware. Patient is very concerned about cancer in his family and recurrance of cancer so would like to proceed with surgery. Will need CT neck with contrast and see Dr. Skip one month before. Will find tentative surgery date at beginning of year to allow recovery time from current surgery.

## 2023-12-30 ENCOUNTER — Encounter
Admit: 2023-12-30 | Discharge: 2023-12-30 | Payer: MEDICARE | Primary: Student in an Organized Health Care Education/Training Program

## 2023-12-31 ENCOUNTER — Encounter
Admit: 2023-12-31 | Discharge: 2023-12-31 | Payer: MEDICARE | Primary: Student in an Organized Health Care Education/Training Program

## 2024-02-04 ENCOUNTER — Encounter
Admit: 2024-02-04 | Discharge: 2024-02-04 | Payer: MEDICARE | Primary: Student in an Organized Health Care Education/Training Program

## 2024-02-04 NOTE — Telephone Encounter [36]
 Kaitlin, RN with [employer] called to report that patient has had gross hematuria since 11/9. Urine is bright red, but is beginning to lighten up. He is passing blood clots in his urine. Patient is afebrile and denies chills, nausea, vomiting, or pain. Provider at [facility] is reaching out to further advise on any recommendations. Recommended that patient pushes fluids and continues to monitor for now. Will route to Dr. Carolynn to notify and further advise.

## 2024-02-24 ENCOUNTER — Encounter
Admit: 2024-02-24 | Discharge: 2024-02-24 | Payer: MEDICARE | Primary: Student in an Organized Health Care Education/Training Program

## 2024-02-24 DIAGNOSIS — C73 Malignant neoplasm of thyroid gland: Secondary | ICD-10-CM

## 2024-02-24 NOTE — Telephone Encounter [36]
 Spoke with patient's wife, she is concerned that patient is still in skilled facility for knee replacement. Patient's wife will talk to patient and decide if they want to proceed with surgery on January 22nd. She will call this RN back this afternoon to decide if they are wanting to wait for surgery or push it out further. Phone number provided to patient's wife.

## 2024-02-27 ENCOUNTER — Encounter
Admit: 2024-02-27 | Discharge: 2024-02-27 | Payer: MEDICARE | Primary: Student in an Organized Health Care Education/Training Program

## 2024-02-27 DIAGNOSIS — E1165 Type 2 diabetes mellitus with hyperglycemia: Principal | ICD-10-CM

## 2024-02-27 MED ORDER — PEN NEEDLE, DIABETIC 29 GAUGE X 1/2" MISC NDLE
1 | Freq: Before meals | 3 refills | 90.00000 days | Status: AC
Start: 2024-02-27 — End: ?

## 2024-02-27 NOTE — Telephone Encounter [36]
 LOV: 10/31/23  Future: 05/21/24    [facility] faxed refill request

## 2024-03-03 ENCOUNTER — Encounter
Admit: 2024-03-03 | Discharge: 2024-03-03 | Payer: MEDICARE | Primary: Student in an Organized Health Care Education/Training Program

## 2024-03-03 NOTE — Telephone Encounter [36]
 Another fax received for pen needles; this was sent 12/4 electronically.  Called [facility]- spoke with Almarie who confirmed RX received and is being filled at this time. Pt will receive shortly.

## 2024-03-16 ENCOUNTER — Encounter
Admit: 2024-03-16 | Discharge: 2024-03-16 | Payer: MEDICARE | Primary: Student in an Organized Health Care Education/Training Program

## 2024-03-30 ENCOUNTER — Encounter
Admit: 2024-03-30 | Discharge: 2024-03-30 | Payer: MEDICARE | Primary: Student in an Organized Health Care Education/Training Program

## 2024-04-02 ENCOUNTER — Encounter
Admit: 2024-04-02 | Discharge: 2024-04-02 | Payer: MEDICARE | Primary: Student in an Organized Health Care Education/Training Program

## 2024-04-03 ENCOUNTER — Ambulatory Visit
Admit: 2024-04-03 | Discharge: 2024-04-03 | Payer: MEDICARE | Primary: Student in an Organized Health Care Education/Training Program

## 2024-04-03 ENCOUNTER — Encounter
Admit: 2024-04-03 | Discharge: 2024-04-03 | Payer: MEDICARE | Primary: Student in an Organized Health Care Education/Training Program

## 2024-04-03 VITALS — BP 154/91 | HR 77 | Temp 97.70000°F | Ht 73.0 in | Wt 250.0 lb

## 2024-04-03 DIAGNOSIS — R591 Generalized enlarged lymph nodes: Secondary | ICD-10-CM

## 2024-04-03 DIAGNOSIS — C799 Secondary malignant neoplasm of unspecified site: Secondary | ICD-10-CM

## 2024-04-03 DIAGNOSIS — C801 Malignant (primary) neoplasm, unspecified: Principal | ICD-10-CM

## 2024-04-03 NOTE — Patient Instructions [37]
 How to contact Dr. Marcelo nurse, Josette [place]:  Clinic phone number [phone]  Clinic fax number [phone]  You may also send messages through MyChart

## 2024-04-03 NOTE — Progress Notes [1]
 Date of Service: 04/03/2024    Presenting Physician: Dr. Louella  Medical Oncologist: Dr. Allena  Surgeon: Dr. Johnie [facility]  Radiation Oncologist: Patient does not have  Endocrinology:  Dr. Angelita Cassis- [facility]    Subjective:             Jimmy Waters is a 69 y.o. male.    History of Present Illness  69 y.o. male with multiple recurrent Hurthle cell thyroid carcinoma (see history below). He is established with Dr. Louella here at [facility].      Prior treatment (XRT, surgery, chemotherapy, etc.):   2009 Thyroid Cancer - Thyroidectomy, RAI - follicular and Hurthle cell features on pathology   2017 Neck Mass- 1/7 LN metastatic Thyroid Cancer Had an extensive right neck dissection (both central and lateral into level V).    Jan 2018 - RAI 154 mCi. Post treatment scan showed some uptake on thyroid bed and mediastinal nodes  2022 Cutaneous Hurthle cell (was originally read as PTC but overead at [place] was Hurthle cell) - Neck nodule [facility] (thought it was a superficial skin cyst)  May 2023: s/p revision right CND for recurrence (done while inpatient due to urosepsis)  Sept 2023- 153.4 mCI, WBS showing some uptake in a left supraclavicular LN     History of Present Illness  Jimmy Waters is a 69 year old male with metastatic thyroid carcinoma who presents for follow-up of a left neck nodule identified on prior imaging.    On PET imaging in August, a small focus of uptake in the left neck corresponded to a 4 mm nodule on CT. He is very concerned about recurrence and strongly prefers excision given his prior thyroid cancer, prior neck surgery, and family history of cancer. He originally detected his thyroid malignancy by noticing a neck mass and remains closely attentive to neck changes.    He knows of stable small pulmonary nodules and is followed by endocrinology. His thyroglobulin and TSH have fluctuated, and his thyroid hormone dose was recently increased to 275 mcg after a period of nonadherence. He has an upcoming endocrinology visit.    He has extensive lower extremity orthopedic surgeries and uses a wheelchair for mobility, with chronic leg skin changes and scarring that may affect positioning and transfer during any procedure.            Past Medical History:    Anxiety disorder    Back pain    Barrett esophagus    Bladder infection, chronic    BPH (benign prostatic hyperplasia)    Cancer of skin    Cataract    Chronic kidney disease    Chronic lower back pain    Chronic UTI    Constipation    Degenerative arthritis    Degenerative disc disease, cervical    Degenerative disc disease, lumbar    Degenerative disc disease, thoracic    Depression (disease)    Diverticulitis of colon (without mention of hemorrhage)(562.11)    DJD (degenerative joint disease)    DM type 2 (diabetes mellitus, type 2) (CMS-HCC)    Dyslipidemia    Erectile dysfunction    Falls    Gastroparesis    Generalized headaches    GERD (gastroesophageal reflux disease)    Gynecomastia    History of dental problems    History of Nissen fundoplication    HLD (hyperlipidemia)    HTN (hypertension)    Hx of arthroscopy of knee    Hypothyroidism  Infection    Joint pain    Kidney stones    MEN (multiple endocrine neoplasia) (CMS-HCC)    Nausea    Neuropathy    Osteoarthritis    Other malignant neoplasm without specification of site    Poor balance    Shiga toxin-producing Escherichia coli infection    Skin cancer    Squamous cell carcinoma    Stomach disorder    Thyroid cancer (CMS-HCC)    Thyroid nodule    Unspecified deficiency anemia    Varicose veins     Surgical History:   Procedure Laterality Date    HX LUMBAR FUSION  1982    L4-5    HX THYROIDECTOMY  09/2007    CYSTOURETHROSCOPY  2012    KIDNEY STONE SURGERY  2012    COLONOSCOPY  02/14/2011    HX APPENDECTOMY  02/2011    REPEAT LEFT LUMBAR 5 TO SACRAL 1 LAMINOTOMY N/A 01/02/2019    Performed by Leonce JONELLE Mallick, MD at [facility] OR    INCISION AND DRAINAGE POSTOPERATIVE WOUND INFECTION - COMPLEX N/A 01/20/2019    Performed by Leonce JONELLE Mallick, MD at [facility] OR    NEPHROSTOMY Right 04/2021    in place from right ureter from OSH    Revision right neck dissection Right 07/27/2021    Performed by Skip Rocky PARAS, MD at [facility] OR    FLEXIBLE LARYNGOSCOPY DIAGNOSTIC N/A 07/27/2021    Performed by Skip Rocky PARAS, MD at [facility] OR    OPEN TREATMENT FEMORAL SHAFT FRACTURE WITH PLATE/ SCREWS WITH/ WITHOUT CERCLAGE Right 08/13/2021    Performed by Bari Cathlyn LABOR, MD at [facility] OR    CYSTECTOMY  06/11/2022    CYSTECTOMY - COMPLETE WITH URETEROILEAL CONDUIT/ SIGMOID BLADDER INCLUDING INTESTINAL ANASTOMOSIS N/A 06/11/2022    Performed by Carolynn Ruther ORN, MD at [facility] OR    ESOPHAGOGASTRODUODENOSCOPY WITH BIOPSY - FLEXIBLE N/A 07/27/2022    Performed by Arlan Nancyann PARAS, MD at [facility] ENDO    BRONCHOSCOPY  2024    CATARACT REMOVAL WITH IMPLANT Bilateral     CYSTOSCOPY      EAR SURGERY      Cataracts    ESOPHAGUS SURGERY      Ablation    GASTRIC FUNDOPLICATION      HX ADENOIDECTOMY      HX HIP REPLACEMENT Bilateral Multiple    revision of 1 hip also    HX KNEE ARTHROSCOPY      HX KNEE REPLACMENT Bilateral     HX LAPAROTOMY      possible colon fistula    HX LYMPHADENECTOMY      HX ROTATOR CUFF REPAIR Bilateral     2006,2007    HX SHOULDER REPLACEMENT Bilateral     HX TONSILLECTOMY      HX VASECTOMY      KNEE SURGERY      LAMINECTOMY      PARATHYROID GLAND SURGERY  2009    PELVIS CLOSED REDUCTION      THYROIDECTOMY      UPPER GASTROINTESTINAL ENDOSCOPY       Family History   Problem Relation Name Age of Onset    Hypertension Mother Ethyle     Cancer-Lung Mother Ethyle 75    Melanoma Mother Ethyle     Cancer Mother Ethyle         lung    Diabetes Mother Ethyle     Heart Disease Mother Ethyle     Hyperlipidemia Mother Ethyle  Thyroid Disease Mother Ethyle     Diabetes Father Bill     Arthritis-osteo Sister [place]     Hypertension Sister [place]     Heart Disease Sister [place]     Cancer Sister [place]         lymphoma    Diabetes Sister [place] Arthritis Sister [place]     Cancer Sister      Cancer-Lung Brother Jim 80    Hyperlipidemia Brother Signe     Hypertension Brother Signe     Diabetes Brother Signe     Arthritis Brother Signe     Cancer Brother Signe         lung    Kidney Disease Brother Signe     Cancer-Colon Maternal Aunt      Cancer Maternal Uncle      Unknown to Patient Paternal Aunt      Unknown to Patient Paternal Uncle      Unknown to Patient Maternal Grandmother Elveria     Unknown to Patient Maternal Grandfather Eldon     Unknown to Patient Paternal Grandmother Unknown     Unknown to Patient Paternal Grandfather Unknown     Cancer Sister [place]         esophaguse    Diabetes Sister [place]     Diabetes Brother Claude     Heart Disease Sister Ginnie     Obesity Brother Marcey     Cancer Sister [place]         esophaguse    Diabetes Sister [place]     Diabetes Brother Claude     Heart Disease Sister Ginnie     Obesity Brother Marcey     Back pain Brother Production Manager      Social History     Socioeconomic History    Marital status: Married   Tobacco Use    Smoking status: Never    Smokeless tobacco: Never   Vaping Use    Vaping status: Never Used   Substance and Sexual Activity    Alcohol use: Yes     Alcohol/week: 1.0 standard drink of alcohol     Types: 1 Standard drinks or equivalent per week     Comment: ~ once a month    Drug use: Never    Sexual activity: Not Currently     Partners: Female     Birth control/protection: None   Social History Narrative    ** Merged History Encounter **              Objective:          acetaminophen  (TYLENOL  EXTRA STRENGTH) 500 mg tablet Take two tablets by mouth daily as needed. Max of 4,000 mg of acetaminophen  in 24 hours.    ascorbic acid (vitamin C) (VITAMIN C) 1,000 mg tablet Take one tablet by mouth at bedtime daily.    atorvastatin  (LIPITOR) 20 mg tablet Take one tablet by mouth daily.    betamethasone dipropionate (BETANATE) 0.05 % topical cream Apply  topically to affected area twice weekly.    CHOLEcalciferoL (vitamin D3) (DIALYVITE VITAMIN D) 125 mcg (5,000 unit) capsule Take one capsule by mouth at bedtime daily.    DEXCOM G7 SENSOR sensor device Use one each as directed every 10 days. Indications: type 2 diabetes mellitus    diclofenac sodium (VOLTAREN) 1 % topical gel Apply  topically to affected area daily as needed.    ferrous sulfate, dried (IRON) 159 mg (45 mg iron) TbER Take one tablet  by mouth at bedtime daily.    gabapentin  (NEURONTIN ) 600 mg tablet Take two tablets by mouth twice daily.    glucagon  1 mg/1 mL injection solution Inject 1 mg into the upper arm, thigh, or buttocks if needed for severe hypoglycemia and then call 911.    HUMALOG  [facility] INSULIN  100 unit/mL subcutaneous PEN Increase pre meal to 20 units ( if drinking pepsi) Take only 10 units if eating a small meal. Humalog  or Novolog : If blood sugar 151-200: Take extra 2 unit. If blood sugar 201-250: Take extra 4 units. If blood sugar 251-300: Take extra 6 units. If blood sugar is 301-350: Take extra 8 units. If blood sugars > 350: Take extra 10 units.    insulin  glargine-lixisenatide (SOLIQUA  100/33) 100 unit-33 mcg/mL syringe Inject thirty Units under the skin at bedtime daily. May titrate back up to previous dose of 25 units once daily as you begin eating normally.  Indications: type 2 diabetes mellitus    insulin  pen needles (disposable) (LITE TOUCH INSULIN  PEN NEEDLES) 29 gauge x 1/2 pen needle Use one each as directed before meals and at bedtime.    ketoconazole (NIZORAL) 2 % topical cream Apply  topically to affected area daily as needed.    ketoconazole (NIZORAL) 2 % topical shampoo Apply  topically to affected area once. Apply topically to affected area of damp skin, lather, leave on 5 minutes, and rinse. Repeat two-three times weekly    levothyroxine  (SYNTHROID ) 200 mcg tablet Take one tablet by mouth daily 30 minutes before breakfast. Take with 75mcg tablet for a total of 275mcg daily.    levothyroxine  (SYNTHROID ) 75 mcg tablet Take one tablet by mouth daily 30 minutes before breakfast. Take with 200mcg tablet for a total of 275mcg daily.    melatonin 10 mg tablet Take one-half tablet by mouth at bedtime daily. Indications: difficulty sleeping    metoprolol  tartrate (LOPRESSOR ) 50 mg tablet Take one tablet by mouth twice daily.    Miscellaneous Medical Supply misc Urostomy supplies and accessories  Dispense twenty of each for one month of supplies  Dx Bilateral hydronephrosis N13.30  Indications: Urostomy    MULTIVITAMIN PO Take 1 tablet by mouth at bedtime daily.    ondansetron  (ZOFRAN  ODT) 8 mg rapid dissolve tablet Dissolve one tablet by mouth every 8 hours as needed.    ONETOUCH ULTRA TEST test strip Use one strip three times daily to check blood glucose. Max daily: 3  Indications: type 2 diabetes mellitus    ONETOUCH ULTRA2 METER kit Use one strip as directed three times daily before meals.    pantoprazole  DR (PROTONIX ) 40 mg tablet Take one tablet by mouth twice daily.    polyethylene glycol 3350  (MIRALAX ) 17 gram/dose powder Take seventeen g by mouth daily. Indications: constipation    triamcinolone acetonide (KENALOG) 0.025 % topical cream Apply  topically to affected area daily as needed.    zinc  sulfate 220 mg (50 mg elemental zinc ) capsule Take one capsule by mouth at bedtime daily.     Vitals:    04/03/24 1153   BP: (!) 154/91  Comment: DIDNT TAKE MEDS   BP Source: Arm, Right Upper   Pulse: 77   Temp: 36.5 ?C (97.7 ?F)   TempSrc: Temporal   PainSc: Zero   Weight: 113.4 kg (250 lb)   Height: 185.4 cm (6' 1)       Body mass index is 32.98 kg/m?SABRA     Physical Exam  Constitutional:  Appearance: Normal appearance.   HENT:      Head: Normocephalic and atraumatic.   Eyes:      Extraocular Movements: Extraocular movements intact.   Neck:      Thyroid: No thyroid mass or thyromegaly.      Trachea: Trachea and phonation normal.      Comments: Poor neck extension but no palpable lymph nodes  Pulmonary:      Effort: Pulmonary effort is normal. Musculoskeletal:         General: Normal range of motion.      Cervical back: Normal range of motion.   Lymphadenopathy:      Cervical: No cervical adenopathy.   Skin:     General: Skin is warm and dry.   Neurological:      General: No focal deficit present.      Mental Status: He is alert and oriented to person, place, and time.   Psychiatric:         Mood and Affect: Mood normal.         Behavior: Behavior normal.       Labs:   Latest Reference Range & Units 12/13/21 09:32 02/07/22 13:55 03/01/22 15:43 07/23/22 19:14 10/12/22 15:47 01/10/23 13:51 05/09/23 11:00 10/11/23 14:25   T4-Free 0.6 - 1.6 ng/dL 1.7 (H) <9.6 (L)  2.1 (H) 1.3 1.9 (H) 0.7 1.2   TSH 0.35 - 5.00 ?IU/mL 230.06 (H) 44.64 (H) 0.56 0.02 (L) <0.01 (L) <0.01 (L) 8.39 (H) 0.11 (L)   Thyroglobulin Ab <=115 IU/mL 15 17 15  16 13 15 22    Thyroglobulin 1.15 - 130.77 ng/mL 1.8 5.7 2.2  1.0 (L) 1.4 (L) 5.58 4.96   (H): Data is abnormally high  (L): Data is abnormally low    October 12 2023  ULTRASOUND OF THE NECK     CLINICAL INDICATION: Male, 68 years;  oncocytic thyroid carcinoma. Status   post thyroidectomy and radioactive iodine  ablation in 2009, repeat right   central and lateral neck dissection followed by radioactive iodine  in   2018. Cutaneous metastatic papillary carcinoma diagnosed in 2020, revision   right neck dissection and repeat RAI in 2023.     TECHNIQUE: Multiple grayscale and color Doppler ultrasound images were   obtained of the anterior neck.     COMPARISON: Head and neck ultrasound 04/11/2023.     FINDINGS:     Changes of total thyroidectomy.     Central compartment:   Right thyroid bed: Postoperative architectural distortion. No nodules   identified.     Left thyroid bed: Postoperative architectural distortion. No nodules   identified.     Lymph nodes: No enlarged or morphologically suspicious central compartment   lymph nodes.     Lateral compartments:   Right: Normal size lymph nodes are seen.  No lymph nodes demonstrate round   shape, calcification or cystic change.     Left: Normal size lymph nodes are seen.  No lymph nodes demonstrate round   shape, calcification or cystic change.     IMPRESSION     Changes of total thyroidectomy. No evidence of local recurrence. No   suspicious anterior cervical lymphadenopathy.     CT Chest wo contrast:   IMPRESSION     Few scattered punctate nodules measuring 2-3 mm. These are probably   unrelated to patient's remote diagnosis of thyroid carcinoma though a few   are not definitively seen previously. Follow-up chest CT in 6 months or   per clinical protocol in order  to ensure stability.     No pathologically enlarged intrathoracic lymph nodes.     Marked coronary artery calcification.     PET/CT  August 2025  IMPRESSION       Development of small nodular focus of uptake along the anterior left   thyroid bed suggestive of tumor recurrence (see saved images within PACS   for localization).     CT Neck and Chest April 03 2024  IMPRESSION     1. Prior thyroidectomy without visible recurrent mass or cervical   lymphadenopathy.   2. Degenerative changes of cervical spine with severe multilevel   left-sided neural foraminal stenosis.     IMPRESSION     1. Stable tiny pulmonary nodules. Advise continued attention on follow-up   imaging per clinical protocol.   2. No new or enlarging intrathoracic lymph nodes.   3. Severe coronary artery atherosclerotic calcification and/or stenting.        Assessment and Plan:  Mr. Sciulli is a 69 yo with multiple medical problems with biopsy proven recurrent PTC with history of Hurthle cell. Treatment has included a total thyroidectomy in 2009 followed by a central/lateral neck recurrence in 2017. Now s/p revision right central neck dissection May 2023 and adjuvant RAI in Sept 2023.   Assessment & Plan  Metastatic oncocytic thyroid carcinoma  Metastatic thyroid carcinoma with stable 4-5 mm left neck lesion (per my review, not on official radiology report), unchanged over six months. Surgical excision not recommended due to lesion size and localization difficulty. Surveillance preferred due to stable lesion and mild thyroglobulin elevation.  - Ordered thyroid cancer surveillance labs, including thyroglobulin and TSH, prior to endocrinology follow-up.  - Scheduled neck ultrasound for July to monitor lesion.  - Advised continued monitoring with imaging and labs rather than surgery.  - Instructed follow-up with endocrinology next month for thyroid management.  - Will update on thyroglobulin and TSH after lab results.

## 2024-04-06 ENCOUNTER — Encounter
Admit: 2024-04-06 | Discharge: 2024-04-06 | Payer: MEDICARE | Primary: Student in an Organized Health Care Education/Training Program
